# Patient Record
Sex: Female | Born: 1960
Health system: Southern US, Community
[De-identification: ages and names within clinical notes are randomized; demographics above are authoritative.]

## PROBLEM LIST (undated history)

## (undated) DIAGNOSIS — I10 Essential (primary) hypertension: Secondary | ICD-10-CM

## (undated) DIAGNOSIS — K5190 Ulcerative colitis, unspecified, without complications: Secondary | ICD-10-CM

## (undated) DIAGNOSIS — H409 Unspecified glaucoma: Secondary | ICD-10-CM

## (undated) DIAGNOSIS — E119 Type 2 diabetes mellitus without complications: Secondary | ICD-10-CM

## (undated) DIAGNOSIS — M1990 Unspecified osteoarthritis, unspecified site: Secondary | ICD-10-CM

## (undated) DIAGNOSIS — F419 Anxiety disorder, unspecified: Secondary | ICD-10-CM

## (undated) DIAGNOSIS — E785 Hyperlipidemia, unspecified: Secondary | ICD-10-CM

## (undated) DIAGNOSIS — T7840XA Allergy, unspecified, initial encounter: Secondary | ICD-10-CM

## (undated) HISTORY — DX: Allergy, unspecified, initial encounter: T78.40XA

## (undated) HISTORY — PX: CERVICAL BIOPSY: SHX590

## (undated) HISTORY — DX: Unspecified osteoarthritis, unspecified site: M19.90

## (undated) HISTORY — DX: Essential (primary) hypertension: I10

## (undated) HISTORY — PX: ENDOMETRIAL BIOPSY: SHX622

## (undated) HISTORY — DX: Ulcerative colitis, unspecified, without complications: K51.90

## (undated) HISTORY — DX: Hyperlipidemia, unspecified: E78.5

## (undated) HISTORY — DX: Anxiety disorder, unspecified: F41.9

## (undated) HISTORY — DX: Unspecified glaucoma: H40.9

## (undated) HISTORY — PX: DILATION AND CURETTAGE OF UTERUS: SHX78

## (undated) HISTORY — DX: Type 2 diabetes mellitus without complications: E11.9

---

## 2000-01-20 ENCOUNTER — Encounter: Admission: RE | Admit: 2000-01-20 | Discharge: 2000-01-20 | Payer: Self-pay | Admitting: Family Medicine

## 2000-01-20 ENCOUNTER — Encounter: Payer: Self-pay | Admitting: Family Medicine

## 2004-08-22 ENCOUNTER — Encounter: Admission: RE | Admit: 2004-08-22 | Discharge: 2004-08-22 | Payer: Self-pay | Admitting: Family Medicine

## 2005-05-05 DIAGNOSIS — K5190 Ulcerative colitis, unspecified, without complications: Secondary | ICD-10-CM

## 2005-05-05 HISTORY — DX: Ulcerative colitis, unspecified, without complications: K51.90

## 2007-03-11 ENCOUNTER — Encounter: Admission: RE | Admit: 2007-03-11 | Discharge: 2007-03-11 | Payer: Self-pay | Admitting: Family Medicine

## 2008-06-08 ENCOUNTER — Encounter: Admission: RE | Admit: 2008-06-08 | Discharge: 2008-06-08 | Payer: Self-pay | Admitting: Internal Medicine

## 2008-08-16 ENCOUNTER — Emergency Department (HOSPITAL_COMMUNITY): Admission: EM | Admit: 2008-08-16 | Discharge: 2008-08-16 | Payer: Self-pay | Admitting: Family Medicine

## 2010-05-25 ENCOUNTER — Encounter: Payer: Self-pay | Admitting: Family Medicine

## 2010-05-27 ENCOUNTER — Encounter
Admission: RE | Admit: 2010-05-27 | Discharge: 2010-05-27 | Payer: Self-pay | Source: Home / Self Care | Attending: Internal Medicine | Admitting: Internal Medicine

## 2012-08-05 ENCOUNTER — Telehealth: Payer: Self-pay | Admitting: Family Medicine

## 2012-08-05 MED ORDER — ONETOUCH DELICA LANCETS 33G MISC: 1.0000 | Freq: Two times a day (BID) | Status: DC

## 2012-08-05 NOTE — Telephone Encounter (Signed)
Medication refilled per protocol. 

## 2012-10-25 ENCOUNTER — Ambulatory Visit: Payer: Self-pay | Admitting: Physician Assistant

## 2012-10-28 ENCOUNTER — Ambulatory Visit (INDEPENDENT_AMBULATORY_CARE_PROVIDER_SITE_OTHER): Payer: BC Managed Care – PPO | Admitting: Physician Assistant

## 2012-10-28 ENCOUNTER — Encounter: Payer: Self-pay | Admitting: Physician Assistant

## 2012-10-28 VITALS — BP 122/76 | HR 78 | Temp 98.4°F | Resp 18 | Ht 64.5 in | Wt 216.0 lb

## 2012-10-28 DIAGNOSIS — K5010 Crohn's disease of large intestine without complications: Secondary | ICD-10-CM | POA: Insufficient documentation

## 2012-10-28 DIAGNOSIS — K5190 Ulcerative colitis, unspecified, without complications: Secondary | ICD-10-CM | POA: Insufficient documentation

## 2012-10-28 DIAGNOSIS — E119 Type 2 diabetes mellitus without complications: Secondary | ICD-10-CM

## 2012-10-28 DIAGNOSIS — F411 Generalized anxiety disorder: Secondary | ICD-10-CM

## 2012-10-28 DIAGNOSIS — F419 Anxiety disorder, unspecified: Secondary | ICD-10-CM | POA: Insufficient documentation

## 2012-10-28 DIAGNOSIS — E785 Hyperlipidemia, unspecified: Secondary | ICD-10-CM | POA: Insufficient documentation

## 2012-10-28 DIAGNOSIS — I10 Essential (primary) hypertension: Secondary | ICD-10-CM

## 2012-10-28 MED ORDER — ALPRAZOLAM 0.5 MG PO TABS: 0.5000 mg | ORAL_TABLET | Freq: Two times a day (BID) | ORAL | Status: DC | PRN

## 2012-10-28 NOTE — Progress Notes (Signed)
Patient ID: Kimberly Solis MRN: [id number], DOB: [birth year], 52 y.o. Date of Encounter: @DATE @  Chief Complaint:  Chief Complaint  Patient presents with  . 3 mth check up    is fasting    HPI: 52 y.o. year old white female  presents for routine f/u OV. She has no complaints today.   1-Has had a lot of stress. Mom has Parkinsons. Dad is blind. Her hasband has lupus and her daughter was jus tdiagnosed with lupus.   2-DM: She brought in BS log. She checks at different times of day every day. All numbers look good. Fasting readings range from 111-150 but most on lower side.  Has eye exam scheduled for next week.   3-Anxiety; Well controlled with Zoloft. This works well and causes no adv effects. Rarely uses Xanax (bottle expired actually!)  4-HLD: Is taking Crestor. No significant mylagias or other adv effects.    Past Medical History  Diagnosis Date  . Diabetes mellitus without complication   . Hypertension   . Hyperlipidemia   . Anxiety   . Ulcerative colitis 05/05/2005     Home Meds: See attached medication section for current medication list. Any medications entered into computer today will not appear on this note's list. The medications listed below were entered prior to today. Current Outpatient Prescriptions on File Prior to Visit  Medication Sig Dispense Refill  . ONETOUCH DELICA LANCETS 40J MISC 1 each by Does not apply route 2 (two) times daily.  100 each  11   No current facility-administered medications on file prior to visit.    Allergies:  Allergies  Allergen Reactions  . Doxycycline Shortness Of Breath    History   Social History  . Marital Status: Married    Spouse Name: N/A    Number of Children: N/A  . Years of Education: N/A   Occupational History  . Not on file.   Social History Main Topics  . Smoking status: Never Smoker   . Smokeless tobacco: Never Used  . Alcohol Use: No  . Drug Use: No  . Sexually Active: Not on file   Other Topics  Concern  . Not on file   Social History Narrative  . No narrative on file    History reviewed. No pertinent family history.   Review of Systems:  See HPI for pertinent ROS. All other ROS negative.    Physical Exam: Blood pressure 122/76, pulse 78, temperature 98.4 F (36.9 C), temperature source Oral, resp. rate 18, height 5' 4.5" (1.638 m), weight 216 lb (97.977 kg)., Body mass index is 36.52 kg/(m^2). General: Overweight WF.Appears in no acute distress. Neck: Supple. No thyromegaly. No lymphadenopathy. No carotid bruits. Lungs: Clear bilaterally to auscultation without wheezes, rales, or rhonchi. Breathing is unlabored. Heart: RRR with S1 S2. No murmurs, rubs, or gallops. Abdomen: Soft, non-tender, non-distended with normoactive bowel sounds. No hepatomegaly. No rebound/guarding. No obvious abdominal masses. Musculoskeletal:  Strength and tone normal for age. Extremities/Skin: Warm and dry. No clubbing or cyanosis. No edema. No rashes or suspicious lesions. Neuro: Alert and oriented X 3. Moves all extremities spontaneously. Gait is normal. CNII-XII grossly in tact. Psych:  Responds to questions appropriately with a normal affect. Diabetic Foot Exam: See attached Section     ASSESSMENT AND PLAN:  52 y.o. year old female with  1. Anxiety Cont Zoloft. Will refill Xanax so she has available if needed.  - ALPRAZolam (XANAX) 0.5 MG tablet; Take 1 tablet (0.5 mg total) by  mouth 2 (two) times daily as needed for sleep.  Dispense: 30 tablet; Refill: 0  2. Diabetes mellitus without complication Has Eye exam next week.  MicroAlbumin Normal 12/13. - Hemoglobin A1C, fingerstick  3. Hyperlipidemia FLP/LFT good 3/14. At goal. Cont Crestor.   4. BP too low for ACE Inh.   5. Mammogram: Pt had in past year per pt  6. Colonoscopy: 2007-Repeat 10 years per pt.  ROV 3 months   Signed, [place], [place], [facility] 10/28/2012 1:56 PM

## 2012-10-28 NOTE — Progress Notes (Deleted)
  Subjective:    Patient ID: Kimberly Solis, female    DOB: [birth year], 52 y.o.   MRN: [id number]  HPI    Review of Systems     Objective:   Physical Exam        Assessment & Plan:

## 2012-10-29 ENCOUNTER — Encounter: Payer: Self-pay | Admitting: Family Medicine

## 2012-11-16 ENCOUNTER — Other Ambulatory Visit: Payer: Self-pay | Admitting: Family Medicine

## 2012-11-17 ENCOUNTER — Other Ambulatory Visit: Payer: Self-pay | Admitting: Family Medicine

## 2012-11-18 NOTE — Telephone Encounter (Signed)
Med refilled.

## 2012-12-30 ENCOUNTER — Other Ambulatory Visit: Payer: Self-pay | Admitting: Physician Assistant

## 2012-12-30 NOTE — Telephone Encounter (Signed)
Medication refilled per protocol. 

## 2013-02-02 ENCOUNTER — Encounter: Payer: Self-pay | Admitting: Family Medicine

## 2013-02-03 ENCOUNTER — Encounter: Payer: Self-pay | Admitting: Physician Assistant

## 2013-02-03 ENCOUNTER — Ambulatory Visit (INDEPENDENT_AMBULATORY_CARE_PROVIDER_SITE_OTHER): Payer: BC Managed Care – PPO | Admitting: Physician Assistant

## 2013-02-03 ENCOUNTER — Other Ambulatory Visit: Payer: Self-pay | Admitting: Physician Assistant

## 2013-02-03 VITALS — BP 120/80 | HR 78 | Temp 98.4°F | Resp 18 | Wt 219.0 lb

## 2013-02-03 DIAGNOSIS — E119 Type 2 diabetes mellitus without complications: Secondary | ICD-10-CM

## 2013-02-03 DIAGNOSIS — K5190 Ulcerative colitis, unspecified, without complications: Secondary | ICD-10-CM

## 2013-02-03 DIAGNOSIS — I10 Essential (primary) hypertension: Secondary | ICD-10-CM

## 2013-02-03 DIAGNOSIS — F411 Generalized anxiety disorder: Secondary | ICD-10-CM

## 2013-02-03 DIAGNOSIS — F419 Anxiety disorder, unspecified: Secondary | ICD-10-CM

## 2013-02-03 DIAGNOSIS — E785 Hyperlipidemia, unspecified: Secondary | ICD-10-CM

## 2013-02-03 LAB — LIPID PANEL
Cholesterol: 138 mg/dL (ref 0–200)
HDL: 41 mg/dL (ref >39)
Total CHOL/HDL Ratio: 3.4 Ratio

## 2013-02-03 LAB — COMPLETE METABOLIC PANEL WITH GFR
Alkaline Phosphatase: 57 U/L (ref 39–117)
BUN: 13 mg/dL (ref 6–23)
GFR, Est Non African American: 88 mL/min
Glucose, Bld: 129 mg/dL — ABNORMAL HIGH (ref 70–99)
Sodium: 142 mEq/L (ref 135–145)
Total Bilirubin: 0.3 mg/dL (ref 0.3–1.2)

## 2013-02-03 NOTE — Telephone Encounter (Signed)
Diabetic supply refill

## 2013-02-03 NOTE — Progress Notes (Signed)
Patient ID: DONNELL BEAUCHAMP MRN: [id number], DOB: 05/13/1960, 52 y.o. Date of Encounter: @DATE @  Chief Complaint:  Chief Complaint  Patient presents with  . 3 mos f/u  . Flu Vaccine    HPI: 52 y.o. year old white female  presents for routine followup office visit.  1- Stress: At last office visit she reported that she had a lot of stress. At that point one of her stressors included that her dad was blind. Today she reports that he actually passed away in January 17, 2023. She reports that she knows her weight is up today. Says that her father was very ill for quite some time prior to his death. During that time she was just having to do what she could get through each day. 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 The patient was not exercising. Says that she is now back to getting on her exercise and watching her diet.  Also at the last office visit she reported to me that her stress included her husband having lupus. And also her daughter had just been diagnosed with lupus.  2- diabetes: She brought in her blood sugar log today. The numbers are a little bit higher than they were on her walk at her last visit here June 26. See #1 above. She recently had an eye exam about one month ago.  3-anxiety: Well-controlled with Zoloft. This works well with no adverse effects. Says that she has used no Xanax at all since the last visit even despite her father's illness and death Judithe Modest.  4-hyperlipidemia: Taking Crestor. No myalgias or other adverse effects.  5- she has complaints of pain in her right hand around the base of her right thumb and wrist area. Says has not fallen on an outstretched arm/hand.she has had no injury or trauma.   Past Medical History  Diagnosis Date  . Diabetes mellitus without complication   . Hypertension   . Hyperlipidemia   . Anxiety   . Ulcerative colitis 05/05/2005     Home Meds:  See attached medication section for current medication list. Any medications entered into computer today will not appear on this note's list. The medications listed below were entered prior to today. Current Outpatient Prescriptions on File Prior to Visit  Medication Sig Dispense Refill  . ALPRAZolam (XANAX) 0.5 MG tablet Take 1 tablet (0.5 mg total) by mouth 2 (two) times daily as needed for sleep.  30 tablet  0  . aspirin 81 MG tablet Take 81 mg by mouth daily.      . CRESTOR 20 MG tablet TAKE 1 TABLET BY MOUTH DAILY  30 tablet  2  . metFORMIN (GLUCOPHAGE) 1000 MG tablet TAKE 1 TABLET BY MOUTH TWICE A DAY  60 tablet  5  . Multiple Vitamin (MULTIVITAMIN) tablet Take 1 tablet by mouth daily. [facility] +50      . ONETOUCH DELICA LANCETS 85Y MISC 1 each by Does not apply route 2 (two) times daily.  100 each  11  . sertraline (ZOLOFT) 100 MG tablet Take 100 mg by mouth daily.       No current facility-administered medications on file prior to visit.    Allergies:  Allergies  Allergen Reactions  . Doxycycline Shortness Of Breath    History   Social History  . Marital Status: Married    Spouse Name: N/A    Number of Children:  N/A  . Years of Education: N/A   Occupational History  . Not on file.   Social History Main Topics  . Smoking status: Never Smoker   . Smokeless tobacco: Never Used  . Alcohol Use: No  . Drug Use: No  . Sexual Activity: Not on file   Other Topics Concern  . 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   She is married.   Her husband has lupus.   Her daughter was recently diagnosed with lupus.   Her mother has Parkinson's disease. Patient is involved with helping care for her.    No family history on file.   Review of Systems:  See HPI for pertinent ROS. All other ROS negative.    Physical Exam: Blood pressure 120/80, pulse 78, temperature 98.4 F (36.9 C), temperature source Oral,  resp. rate 18, weight 219 lb (99.338 kg)., Body mass index is 37.02 kg/(m^2). General:  overweight white female . Appears in no acute distress. Neck: Supple. No thyromegaly. No lymphadenopathy. no carotid bruits . Lungs: Clear bilaterally to auscultation without wheezes, rales, or rhonchi. Breathing is unlabored. Heart: RRR with S1 S2. No murmurs, rubs, or gallops. Abdomen: Soft, non-tender, non-distended with normoactive bowel sounds. No hepatomegaly. No rebound/guarding. No obvious abdominal masses. Musculoskeletal:  Strength and tone normal for age. Extremities/Skin: Warm and dry. No clubbing or cyanosis. No edema. No rashes or suspicious lesions. Neuro: Alert and oriented X 3. Moves all extremities spontaneously. Gait is normal. CNII-XII grossly in tact. Psych:  Responds to questions appropriately with a normal affect. Diabetic foot exam: Inspection is normal. No deformities no ulcerations no skin breakdown. Sensation is intact to touch and monofilament testing bilaterally. She has trace bilateral dorsalis pedis and posterior tibial pulses      ASSESSMENT AND PLAN:  52 y.o. year old female with  1. Diabetes mellitus without complication SEE HISTORY OF PRESENT ILLNESS. IF HER A1C IS ELEVATED TODAY WILL SIMPLY LET HER IMPROVE HER DIET AND EXERCISE PRIOR TO MAKING MEDICATION ADJUSTMENTS. MICROALBUMIN NORMAL 04/2012. SHE JUST HAD AN EYE EXAM IN THE PAST MONTH. Foot EXAM IS STABLE - COMPLETE METABOLIC PANEL WITH GFR - Hemoglobin A1c  2. Hypertension Blood pressure has been too low to add ACE inhibitor - COMPLETE METABOLIC PANEL WITH GFR  3. Hyperlipidemia WITH THE LAST CHECK MARCH 2014 ON CRESTOR. RECHECK NOW. - COMPLETE METABOLIC PANEL WITH GFR - Lipid panel  4. Anxiety Controlled with Zoloft. Never needs the Xanax.   5. 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 As well she has  been doing a lot of walking and moving items secondary to her father's death. Told her to rest the hand as much as possible for one week and take anti-inflammatories routinely for one week. The pain has not improved then will do evaluation.  5. Ulcerative colitis  6. mammogram: Patient had in the past year per her report   7. colonoscopy: 2007. Repeat 10 years per patient.  8. Immunizations: She is receiving influenza vaccine today. She states that her tetanus vaccine is up to date.  WILL GIVE PNEUMOVAX AT NEXT OV-NOT TODAY SINCE GETTING FLU VACCINE--GIVEN HER DM, NEEDS PNEUMOVAX  REGULAR OFFICE VISIT 3 MONTHS OR SOONER IF NEEds [place].   Signed, [address] [place], [place], [facility] 02/03/2013 9:46 AM

## 2013-02-03 NOTE — Progress Notes (Deleted)
Patient ID: Kimberly Solis MRN: [id number], DOB: 07-20-1960, 52 y.o. Date of Encounter: @DATE @  Chief Complaint:  Chief Complaint  Patient presents with  . 3 mos f/u  . Flu Vaccine    HPI: 52 y.o. year old female  presents with    Past Medical History  Diagnosis Date  . Diabetes mellitus without complication   . Hypertension   . Hyperlipidemia   . Anxiety   . Ulcerative colitis 05/05/2005     Home Meds: See attached medication section for current medication list. Any medications entered into computer today will not appear on this note's list. The medications listed below were entered prior to today. Current Outpatient Prescriptions on File Prior to Visit  Medication Sig Dispense Refill  . ALPRAZolam (XANAX) 0.5 MG tablet Take 1 tablet (0.5 mg total) by mouth 2 (two) times daily as needed for sleep.  30 tablet  0  . aspirin 81 MG tablet Take 81 mg by mouth daily.      . CRESTOR 20 MG tablet TAKE 1 TABLET BY MOUTH DAILY  30 tablet  2  . metFORMIN (GLUCOPHAGE) 1000 MG tablet TAKE 1 TABLET BY MOUTH TWICE A DAY  60 tablet  5  . Multiple Vitamin (MULTIVITAMIN) tablet Take 1 tablet by mouth daily. [facility] +50      . ONETOUCH DELICA LANCETS 40J MISC 1 each by Does not apply route 2 (two) times daily.  100 each  11  . sertraline (ZOLOFT) 100 MG tablet Take 100 mg by mouth daily.       No current facility-administered medications on file prior to visit.    Allergies:  Allergies  Allergen Reactions  . Doxycycline Shortness Of Breath    History   Social History  . Marital Status: Married    Spouse Name: N/A    Number of Children: N/A  . Years of Education: N/A   Occupational History  . Not on file.   Social History Main Topics  . Smoking status: Never Smoker   . Smokeless tobacco: Never Used  . Alcohol Use: No  . Drug Use: No  . Sexual Activity: Not on file   Other Topics Concern  . Not on file   Social History Narrative  . No narrative on file    No  family history on file.   Review of Systems:  See HPI for pertinent ROS. All other ROS negative. ***   Physical Exam:*** Blood pressure 120/80, pulse 78, temperature 98.4 F (36.9 C), temperature source Oral, resp. rate 18, weight 219 lb (99.338 kg)., Body mass index is 37.02 kg/(m^2). General: Appears in no acute distress. Head: Normocephalic, atraumatic, eyes without discharge, sclera non-icteric, nares are without discharge. Bilateral auditory canals clear, TM's are without perforation, pearly grey and translucent with reflective cone of light bilaterally. Oral cavity moist, posterior pharynx without exudate, erythema, peritonsillar abscess, or post nasal drip.  Neck: Supple. No thyromegaly. No lymphadenopathy. Lungs: Clear bilaterally to auscultation without wheezes, rales, or rhonchi. Breathing is unlabored. Heart: RRR with S1 S2. No murmurs, rubs, or gallops. Abdomen: Soft, non-tender, non-distended with normoactive bowel sounds. No hepatomegaly. No rebound/guarding. No obvious abdominal masses. Musculoskeletal:  Strength and tone normal for age. Extremities/Skin: Warm and dry. No clubbing or cyanosis. No edema. No rashes or suspicious lesions. Neuro: Alert and oriented X 3. Moves all extremities spontaneously. Gait is normal. CNII-XII grossly in tact. Psych:  Responds to questions appropriately with a normal affect.     ASSESSMENT  AND PLAN:  52 y.o. year old female with  1. Diabetes mellitus without complication *** - COMPLETE METABOLIC PANEL WITH GFR - Hemoglobin A1c  2. Hypertension *** - COMPLETE METABOLIC PANEL WITH GFR  3. Hyperlipidemia *** - COMPLETE METABOLIC PANEL WITH GFR - Lipid panel  4. Anxiety ***  5. Ulcerative colitis ***   Signed, [address] [place], [place], [facility] 02/03/2013 9:43 AM

## 2013-04-05 ENCOUNTER — Other Ambulatory Visit: Payer: Self-pay | Admitting: Physician Assistant

## 2013-05-12 ENCOUNTER — Ambulatory Visit (INDEPENDENT_AMBULATORY_CARE_PROVIDER_SITE_OTHER): Payer: BC Managed Care – PPO | Admitting: Physician Assistant

## 2013-05-12 ENCOUNTER — Encounter: Payer: Self-pay | Admitting: Physician Assistant

## 2013-05-12 VITALS — BP 114/70 | HR 72 | Temp 98.2°F | Resp 18 | Wt 219.0 lb

## 2013-05-12 DIAGNOSIS — F419 Anxiety disorder, unspecified: Secondary | ICD-10-CM

## 2013-05-12 DIAGNOSIS — E785 Hyperlipidemia, unspecified: Secondary | ICD-10-CM

## 2013-05-12 DIAGNOSIS — Z23 Encounter for immunization: Secondary | ICD-10-CM

## 2013-05-12 DIAGNOSIS — F411 Generalized anxiety disorder: Secondary | ICD-10-CM

## 2013-05-12 DIAGNOSIS — E119 Type 2 diabetes mellitus without complications: Secondary | ICD-10-CM

## 2013-05-12 DIAGNOSIS — I10 Essential (primary) hypertension: Secondary | ICD-10-CM

## 2013-05-12 LAB — HEMOGLOBIN A1C, FINGERSTICK: HEMOGLOBIN A1C, FINGERSTICK: 7.2 % — AB (ref <5.7)

## 2013-05-12 LAB — MICROALBUMIN, URINE: MICROALB UR: 1.88 mg/dL (ref 0.00–1.89)

## 2013-05-12 NOTE — Progress Notes (Signed)
Patient ID: SHERINE CORTESE MRN: [id number], DOB: 03-25-1961, 53 y.o. Date of Encounter: @DATE @  Chief Complaint:  Chief Complaint  Patient presents with  . 3 mth check up    is fasting    HPI: 53 y.o. year old white female  presents for routine followup office visit.  Her last visit with me was 02/03/13. At that time she reported that her father  recently passed away. She reported that he had been very ill for quite some time prior to his death. During that time she was just having to do what she could do to get through each day. Was having to eat whatever she could. Was having to help with transporting her mother back and forth to the hospital et [name]. She was not exercising.  Also at that visit she reported that her husband has lupus and that her daughter had just recently been diagnosed with lupus as well.  Today she reports that they had a good Christmas despite the fact that her father had recently passed.  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  She says that her husband and daughter both see Dr. [name] for their Lupus.  She says that with the holidays she still has not been able to get back to a good routine with diet and exercise. However she feels that this has remained stable compared to her last visit here. She forgot to bring her blood sugar log sheet today but says that she's been getting similar readings as she was at her last visit.  She is taking Crestor and having no myalgias or other adverse effects.   Past Medical History  Diagnosis Date  . Diabetes mellitus without complication   . Hypertension   . Hyperlipidemia   . Anxiety   . Ulcerative colitis 05/05/2005     Home Meds: See attached medication section for current medication list. Any medications entered into computer  today will not appear on this note's list. The medications listed below were entered prior to today. Current Outpatient Prescriptions on File Prior to Visit  Medication Sig Dispense Refill  . ALPRAZolam (XANAX) 0.5 MG tablet Take 1 tablet (0.5 mg total) by mouth 2 (two) times daily as needed for sleep.  30 tablet  0  . aspirin 81 MG tablet Take 81 mg by mouth daily.      . CRESTOR 20 MG tablet TAKE 1 TABLET BY MOUTH EVERY DAY  30 tablet  2  . metFORMIN (GLUCOPHAGE) 1000 MG tablet TAKE 1 TABLET BY MOUTH TWICE A DAY  60 tablet  5  . Multiple Vitamin (MULTIVITAMIN) tablet Take 1 tablet by mouth daily. [facility] +50      . ONE TOUCH ULTRA TEST test strip TEST SUGAR TWICE A DAY  50 each  12  . ONETOUCH DELICA LANCETS 62E MISC 1 each by Does not apply route 2 (two) times daily.  100 each  11  . sertraline (ZOLOFT) 100 MG tablet Take 100 mg by mouth daily.       No current facility-administered medications on file prior to visit.    Allergies:  Allergies  Allergen Reactions  . Doxycycline Shortness Of Breath    History   Social History  . Marital Status: Married    Spouse Name: N/A  Number of Children: N/A  . Years of Education: N/A   Occupational History  . Not on file.   Social History Main Topics  . Smoking status: Never Smoker   . Smokeless tobacco: Never Used  . Alcohol Use: No  . Drug Use: No  . Sexual Activity: Not on file   Other Topics Concern  . 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   She is married.   Her husband has lupus.   Her daughter was recently diagnosed with lupus.   Her mother has Parkinson's disease. Patient is involved with helping care for her.    No family history on file.   Review of Systems:  See HPI for pertinent ROS. All other ROS negative.    Physical Exam: Blood pressure 114/70, pulse 72, temperature 98.2 F (36.8 C), temperature source Oral, resp. rate 18, weight  219 lb (99.338 kg)., Body mass index is 37.02 kg/(m^2). General: Overweight white female .Appears in no acute distress. Neck: Supple. No thyromegaly. No lymphadenopathy. No carotid bruits. Lungs: Clear bilaterally to auscultation without wheezes, rales, or rhonchi. Breathing is unlabored. Heart: RRR with S1 S2. No murmurs, rubs, or gallops. Abdomen: Soft, non-tender, non-distended with normoactive bowel sounds. No hepatomegaly. No rebound/guarding. No obvious abdominal masses. Musculoskeletal:  Strength and tone normal for age. Extremities/Skin: Warm and dry. No clubbing or cyanosis. No edema. No rashes or suspicious lesions. Neuro: Alert and oriented X 3. Moves all extremities spontaneously. Gait is normal. CNII-XII grossly in tact. Psych:  Responds to questions appropriately with a normal affect. Diabetic foot exam: Inspection is normal. No deformities. No ulcerations or skin breakdown. Sensation is intact to touch and monofilament test bi laterally. She has trace bilateral dorsalis pedis and posterior tibial pulses.      ASSESSMENT AND PLAN:  53 y.o. year old female with  1. Diabetes mellitus without complication - Hemoglobin A1c - Microalbumin, urine  Last microalbumin normal 04/2012. Repeat now. She had diabetic eye exam September 2014 Foot exam is normal  She is on statin. She is on no ACE inhibitor  secondary to low blood pressure.   2. Hyperlipidemia She is on Crestor. Lipid panel was excellent at check 02/03/13. LFTs normal. He to recheck at next visit.  3. BP: Blood pressure is on the low side. Therefore can not add ACE inhibitor.  4. Anxiety This is well controlled with Zoloft. She has been given Xanax use as needed but never needs this.  5. Mammogram: She has had in the past year per patient report. Up-to-date.  6. colonoscopy: 2007. Repeat 10 years per patient.  7. Immunizations: Influenza vaccine given here 02/03/13 Tetanus: She reports that this is  up-to-date. Pneumonia vaccine: Given that she has diabetes, she needs this. She is agreeable to receive this. We'll give Pneumovax 23 now.  Regular office visit 3 months or sooner if needed.   [address] [place], [place], [facility] 05/12/2013 8:39 AM

## 2013-05-13 ENCOUNTER — Telehealth: Payer: Self-pay | Admitting: Family Medicine

## 2013-05-13 ENCOUNTER — Other Ambulatory Visit: Payer: Self-pay

## 2013-05-13 DIAGNOSIS — Z1231 Encounter for screening mammogram for malignant neoplasm of breast: Secondary | ICD-10-CM

## 2013-05-13 NOTE — Telephone Encounter (Signed)
Message copied by Olena Mater on Fri May 13, 2013 11:28 AM ------      Message from: Dena Billet      Created: Thu May 12, 2013  1:28 PM       Tell patient that hemoglobin A1c has risen to 7.2.      Tell her that she either has to make changes in her diet, or I need to adjust her medications.      Ask Patient if she thinks that she realistically can make some diet changes. If not then let me know and I will go ahead and adjust medicines.      Tell her to make sure to follow up in 3 months so we can keep closely monitor this. ------

## 2013-05-13 NOTE — Telephone Encounter (Signed)
Pt aware of lab results and provider recommendations.  States she is going to make dietary changes and exercise.  Has appt for 3 months

## 2013-06-09 ENCOUNTER — Ambulatory Visit: Payer: Self-pay

## 2013-06-19 ENCOUNTER — Other Ambulatory Visit: Payer: Self-pay | Admitting: Family Medicine

## 2013-06-30 ENCOUNTER — Ambulatory Visit: Payer: Self-pay

## 2013-07-03 ENCOUNTER — Other Ambulatory Visit: Payer: Self-pay | Admitting: Physician Assistant

## 2013-07-03 DIAGNOSIS — E785 Hyperlipidemia, unspecified: Secondary | ICD-10-CM

## 2013-07-04 ENCOUNTER — Other Ambulatory Visit: Payer: Self-pay | Admitting: Physician Assistant

## 2013-07-04 NOTE — Telephone Encounter (Signed)
Medication refilled per protocol. 

## 2013-07-05 NOTE — Telephone Encounter (Signed)
Refill appropriate and filled per protocol. 

## 2013-07-21 ENCOUNTER — Ambulatory Visit
Admission: RE | Admit: 2013-07-21 | Discharge: 2013-07-21 | Disposition: A | Discharge status: Home / Self Care | Payer: BC Managed Care – PPO | Source: Ambulatory Visit

## 2013-07-21 DIAGNOSIS — Z1231 Encounter for screening mammogram for malignant neoplasm of breast: Secondary | ICD-10-CM

## 2013-07-21 LAB — HM PAP SMEAR: HM Pap smear: NORMAL

## 2013-08-18 ENCOUNTER — Encounter: Payer: Self-pay | Admitting: Physician Assistant

## 2013-08-18 ENCOUNTER — Ambulatory Visit (INDEPENDENT_AMBULATORY_CARE_PROVIDER_SITE_OTHER): Payer: BC Managed Care – PPO | Admitting: Physician Assistant

## 2013-08-18 VITALS — BP 122/80 | HR 84 | Temp 98.3°F | Resp 18 | Ht 66.0 in | Wt 220.0 lb

## 2013-08-18 DIAGNOSIS — F419 Anxiety disorder, unspecified: Secondary | ICD-10-CM

## 2013-08-18 DIAGNOSIS — E785 Hyperlipidemia, unspecified: Secondary | ICD-10-CM

## 2013-08-18 DIAGNOSIS — E119 Type 2 diabetes mellitus without complications: Secondary | ICD-10-CM

## 2013-08-18 DIAGNOSIS — F411 Generalized anxiety disorder: Secondary | ICD-10-CM

## 2013-08-18 DIAGNOSIS — K5190 Ulcerative colitis, unspecified, without complications: Secondary | ICD-10-CM

## 2013-08-18 DIAGNOSIS — I10 Essential (primary) hypertension: Secondary | ICD-10-CM

## 2013-08-18 LAB — COMPLETE METABOLIC PANEL WITH GFR
ALBUMIN: 4.7 g/dL (ref 3.5–5.2)
ALK PHOS: 62 U/L (ref 39–117)
ALT: 20 U/L (ref 0–35)
AST: 18 U/L (ref 0–37)
BILIRUBIN TOTAL: 0.3 mg/dL (ref 0.2–1.2)
BUN: 8 mg/dL (ref 6–23)
CO2: 24 mEq/L (ref 19–32)
CREATININE: 0.71 mg/dL (ref 0.50–1.10)
Calcium: 9.9 mg/dL (ref 8.4–10.5)
Chloride: 103 mEq/L (ref 96–112)
GFR, Est African American: >89 mL/min
GLUCOSE: 146 mg/dL — AB (ref 70–99)
POTASSIUM: 5 meq/L (ref 3.5–5.3)
Sodium: 140 mEq/L (ref 135–145)
Total Protein: 7.1 g/dL (ref 6.0–8.3)

## 2013-08-18 LAB — HEPATIC FUNCTION PANEL
ALBUMIN: 4.7 g/dL (ref 3.5–5.2)
ALK PHOS: 62 U/L (ref 39–117)
ALT: 20 U/L (ref 0–35)
AST: 18 U/L (ref 0–37)
BILIRUBIN INDIRECT: 0.2 mg/dL (ref 0.2–1.2)
Bilirubin, Direct: 0.1 mg/dL (ref 0.0–0.3)
TOTAL PROTEIN: 7.1 g/dL (ref 6.0–8.3)
Total Bilirubin: 0.3 mg/dL (ref 0.2–1.2)

## 2013-08-18 LAB — LIPID PANEL
Cholesterol: 143 mg/dL (ref 0–200)
HDL: 47 mg/dL (ref >39)
LDL CALC: 72 mg/dL (ref 0–99)
TRIGLYCERIDES: 122 mg/dL (ref <150)
Total CHOL/HDL Ratio: 3 Ratio
VLDL: 24 mg/dL (ref 0–40)

## 2013-08-18 LAB — HEMOGLOBIN A1C
HEMOGLOBIN A1C: 7.7 % — AB (ref <5.7)
MEAN PLASMA GLUCOSE: 174 mg/dL — AB (ref <117)

## 2013-08-18 MED ORDER — ALPRAZOLAM 0.5 MG PO TABS: 0.5000 mg | ORAL_TABLET | Freq: Two times a day (BID) | ORAL | Status: DC | PRN

## 2013-08-18 NOTE — Progress Notes (Signed)
Patient ID: Kimberly Solis MRN: [id number], DOB: [birth year], 53 y.o. Date of Encounter: @DATE @  Chief Complaint:  Chief Complaint  Patient presents with  . 3 mth check up    is fasting, refill xanax    HPI: 53 y.o. year old white female  presents for routine followup office visit.  At  Her OV 02/03/13  she reported that her father  recently passed away. She reported that he had been very ill for quite some time prior to his death. During that time she was just having to do what she could do to get through each day. Was having to eat whatever she could. Was having to help with transporting her mother back and forth to the hospital et [name]. She was not exercising.  Also at that visit she reported that her husband has lupus and that her daughter had just recently been diagnosed with lupus as well.  At [place] 05/2013 she reported that they had a good Christmas despite the fact that her father had recently passed.  She says that she is used to working with seniors and being around seniors. 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  She says that her husband and daughter both see Dr. [name] for their Lupus.  She said that with the holidays she still had not been able to get back to a good routine with diet and exercise.  Today she reports that her husband is having bilateral knee replacements by Dr. [name] tomorrow. Says that in the past they have already gone through their 401(k) money to pay for medical expenses. Says that now he recently had to change the mortgage so that they could figure out ways to pay for medical expenses now. Says that all of this has been extremely stressful in addition to fact that like this upcoming week she will need to be out of work to help care for him. As well her mom as well as his mom to keep him busy with them try to help care for them. Is that there  is constant stress. However she says that she thinks feels that she is handling it pretty well. Today she is requesting a prescription for Xanax. Says that she recently has been having to take one every night. Says that otherwise her muscles are extremely tight and she is not sleeping well. As of the Xanax relaxes her muscles and also helps her get to sleep. She never takes it during the day and only takes it at night.  She is taking Crestor and having no myalgias or other adverse effects.   Past Medical History  Diagnosis Date  . Diabetes mellitus without complication   . Hypertension   . Hyperlipidemia   . Anxiety   . Ulcerative colitis 05/05/2005     Home Meds: See attached medication section for current medication list. Any medications entered into computer today will not appear on this note's list. The medications listed below were entered prior to today. Current Outpatient Prescriptions on File Prior to Visit  Medication Sig Dispense Refill  . aspirin 81 MG tablet Take 81 mg by mouth daily.      . CRESTOR 20 MG tablet TAKE 1 TABLET BY MOUTH EVERY DAY  30 tablet  2  . metFORMIN (GLUCOPHAGE) 1000 MG tablet TAKE 1 TABLET BY MOUTH TWICE A DAY  60 tablet  5  .  Multiple Vitamin (MULTIVITAMIN) tablet Take 1 tablet by mouth daily. [facility] +50      . ONE TOUCH ULTRA TEST test strip TEST SUGAR TWICE A DAY  50 each  12  . ONETOUCH DELICA LANCETS 98J MISC 1 each by Does not apply route 2 (two) times daily.  100 each  11  . sertraline (ZOLOFT) 100 MG tablet Take 100 mg by mouth daily.       No current facility-administered medications on file prior to visit.    Allergies:  Allergies  Allergen Reactions  . Doxycycline Shortness Of Breath    History   Social History  . Marital Status: Married    Spouse Name: N/A    Number of Children: N/A  . Years of Education: N/A   Occupational History  . Not on file.   Social History Main Topics  . Smoking status: Never Smoker   .  Smokeless tobacco: Never Used  . Alcohol Use: No  . Drug Use: No  . Sexual Activity: Not on file   Other Topics Concern  . 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   She is married.   Her husband has lupus.   Her daughter was recently diagnosed with lupus.   Her mother has Parkinson's disease. Patient is involved with helping care for her.    No family history on file.   Review of Systems:  See HPI for pertinent ROS. All other ROS negative.    Physical Exam: Blood pressure 122/80, pulse 84, temperature 98.3 F (36.8 C), temperature source Oral, resp. rate 18, height 5' 6"  (1.676 m), weight 220 lb (99.791 kg)., Body mass index is 35.53 kg/(m^2). General: Overweight white female .Appears in no acute distress. Neck: Supple. No thyromegaly. No lymphadenopathy. No carotid bruits. Lungs: Clear bilaterally to auscultation without wheezes, rales, or rhonchi. Breathing is unlabored. Heart: RRR with S1 S2. No murmurs, rubs, or gallops. Abdomen: Soft, non-tender, non-distended with normoactive bowel sounds. No hepatomegaly. No rebound/guarding. No obvious abdominal masses. Musculoskeletal:  Strength and tone normal for age. Extremities/Skin: Warm and dry. No clubbing or cyanosis. No edema. No rashes or suspicious lesions. Neuro: Alert and oriented X 3. Moves all extremities spontaneously. Gait is normal. CNII-XII grossly in tact. Psych:  Responds to questions appropriately with a normal affect. Diabetic foot exam: Inspection is normal. No deformities. No ulcerations or skin breakdown. Sensation is intact to touch and monofilament test bi laterally. She has trace bilateral dorsalis pedis and posterior tibial pulses.      ASSESSMENT AND PLAN:  53 y.o. year old female with  1. Diabetes mellitus without complication - Hemoglobin A1c - Microalbumin, urine--done LOV   She had diabetic eye exam September 2014 Foot exam is  normal  She is on Aspirin 52m QD She is on statin. She is on no ACE inhibitor  secondary to low blood pressure.   2. Hyperlipidemia She is on Crestor. Lipid panel was excellent at check 02/03/13. LFTs normal. Due to rec heck FLP/LFT. Pt is fasting.  3. BP: Blood pressure is on the low side. Therefore can not add ACE inhibitor.  4. Anxiety This is well controlled with Zoloft. She has been given Xanax to use as needed but was never needing this--up through last visit.  Today reports that she does need refill on Xanax b/c she is needing to take it every night. Says it helps her relax her muscles and  helps her get better sleep. Will give Rx Xanax today.  5. Mammogram: She has had in the past year per patient report. Up-to-date.  6. colonoscopy: 2007. Repeat 10 years per patient.  7. Immunizations: Influenza vaccine given here 02/03/13 Tetanus: She reports that this is up-to-date. Pneumonia vaccine: Given that she has diabetes, she needs this. She is agreeable to receive this. We'll give Pneumovax 23 now.  Regular office visit 3 months or sooner if needed.   Marin Olp Nadine, [place], [facility] 08/18/2013 8:42 AM

## 2013-08-19 ENCOUNTER — Telehealth: Payer: Self-pay | Admitting: Family Medicine

## 2013-08-19 DIAGNOSIS — IMO0001 Reserved for inherently not codable concepts without codable children: Secondary | ICD-10-CM

## 2013-08-19 DIAGNOSIS — E1165 Type 2 diabetes mellitus with hyperglycemia: Principal | ICD-10-CM

## 2013-08-19 MED ORDER — PIOGLITAZONE HCL 30 MG PO TABS: 30.0000 mg | ORAL_TABLET | Freq: Every day | ORAL | Status: DC

## 2013-08-19 NOTE — Telephone Encounter (Signed)
Message copied by Olena Mater on Fri Aug 19, 2013 10:12 AM ------      Message from: Dena Billet      Created: Fri Aug 19, 2013  5:07 AM       Patient's A1c is up to 7.7.      Tell her she needs to add Actos 30 mg once a day.      Tell her that all other labs are excellent.      She is already scheduled followup for 3 months.Tell  Her to make sure to keep that appointment and followup in 3 months.      Rx for actos 30 mg one by mouth daily #30 with 2 refills ------

## 2013-08-19 NOTE — Telephone Encounter (Signed)
RX to pharmacy.  Have left message for patient to call me back

## 2013-08-22 NOTE — Telephone Encounter (Signed)
Spoke to patient, aware of lab results.  New RX to pharmacy.  Appt for 3 mths already made

## 2013-09-24 ENCOUNTER — Other Ambulatory Visit: Payer: Self-pay | Admitting: Physician Assistant

## 2013-09-24 DIAGNOSIS — E1165 Type 2 diabetes mellitus with hyperglycemia: Principal | ICD-10-CM

## 2013-09-24 DIAGNOSIS — IMO0001 Reserved for inherently not codable concepts without codable children: Secondary | ICD-10-CM

## 2013-09-27 NOTE — Telephone Encounter (Signed)
Lancets refilled x 1 year

## 2013-10-24 ENCOUNTER — Other Ambulatory Visit: Payer: Self-pay | Admitting: Physician Assistant

## 2013-10-24 DIAGNOSIS — E785 Hyperlipidemia, unspecified: Secondary | ICD-10-CM

## 2013-10-25 NOTE — Telephone Encounter (Signed)
Medication refilled per protocol. 

## 2013-10-26 ENCOUNTER — Other Ambulatory Visit: Payer: Self-pay | Admitting: Physician Assistant

## 2013-10-26 NOTE — Telephone Encounter (Signed)
Medication refilled per protocol. 

## 2013-11-14 ENCOUNTER — Other Ambulatory Visit: Payer: Self-pay | Admitting: Physician Assistant

## 2013-11-15 NOTE — Telephone Encounter (Signed)
?  ok to refill, lov 08/18/13

## 2013-11-16 NOTE — Telephone Encounter (Signed)
Prescription sent to pharmacy.

## 2013-11-16 NOTE — Telephone Encounter (Signed)
Because of her diabetes, she was to have followup office visit 3 months. This is scheduled for 11/17/13. Her anxiety/depression are well-controlled. Ok to send  prescription refill of Zoloft with 5 refills.

## 2013-11-17 ENCOUNTER — Ambulatory Visit (INDEPENDENT_AMBULATORY_CARE_PROVIDER_SITE_OTHER): Payer: BC Managed Care – PPO | Admitting: Physician Assistant

## 2013-11-17 ENCOUNTER — Encounter: Payer: Self-pay | Admitting: Physician Assistant

## 2013-11-17 VITALS — BP 120/78 | HR 78 | Temp 98.3°F | Resp 14 | Ht 65.0 in | Wt 215.0 lb

## 2013-11-17 DIAGNOSIS — I10 Essential (primary) hypertension: Secondary | ICD-10-CM

## 2013-11-17 DIAGNOSIS — F411 Generalized anxiety disorder: Secondary | ICD-10-CM

## 2013-11-17 DIAGNOSIS — E119 Type 2 diabetes mellitus without complications: Secondary | ICD-10-CM

## 2013-11-17 DIAGNOSIS — K5190 Ulcerative colitis, unspecified, without complications: Secondary | ICD-10-CM

## 2013-11-17 DIAGNOSIS — E785 Hyperlipidemia, unspecified: Secondary | ICD-10-CM

## 2013-11-17 DIAGNOSIS — F419 Anxiety disorder, unspecified: Secondary | ICD-10-CM

## 2013-11-17 DIAGNOSIS — K51919 Ulcerative colitis, unspecified with unspecified complications: Secondary | ICD-10-CM

## 2013-11-17 LAB — HEMOGLOBIN A1C, FINGERSTICK: Hgb A1C (fingerstick): 6.9 % — ABNORMAL HIGH (ref <5.7)

## 2013-11-17 MED ORDER — SERTRALINE HCL 100 MG PO TABS: ORAL_TABLET | ORAL | Status: DC

## 2013-11-17 MED ORDER — METFORMIN HCL 1000 MG PO TABS: ORAL_TABLET | ORAL | Status: DC

## 2013-11-17 MED ORDER — ROSUVASTATIN CALCIUM 20 MG PO TABS: ORAL_TABLET | ORAL | Status: DC

## 2013-11-17 NOTE — Progress Notes (Signed)
Patient ID: Kimberly Solis MRN: [id number], DOB: [birth year], 53 y.o. Date of Encounter: @DATE @  Chief Complaint:  Chief Complaint  Patient presents with  . 3 month F/U    is fasting  . Wants to discuss Actos    long term side effects    HPI: 53 y.o. year old white female  presents for routine followup office visit.  At  Her OV 02/03/13  she reported that her father  recently passed away. She reported that he had been very ill for quite some time prior to his death. During that time she was just having to do what she could do to get through each day. Was having to eat whatever she could. Was having to help with transporting her mother back and forth to the hospital [name]. She was not exercising.  Also at that visit she reported that her husband has lupus and that her daughter had just recently been diagnosed with lupus as well.   She says that she is used to working with seniors and being around seniors.   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  She says that her husband and daughter both see Dr. [name] for their Lupus.   At her OV 08/2013 she reported that her husband was having bilateral knee replacements by Dr. [name] the next day.  Today she reports that he did have the bilateral knee replacements. She says that he has done excellent. She says that rather than sending him somewhere for a rehabilitation/physical therapy, Dr. [name] allowed him to go home and for her to care for him. She says that he did excellent with this. She was able to cook healthy foods for him and keep him much more comfortable. Says that he is was just able to return to work this week. Says she is delighted to see that he is able to return to his work which he enjoys and not be in pain.  At prior OV she told me that in the past they have already gone through their 401(k) money to pay for  medical expenses. Says that now he recently had to change the mortgage so that they could figure out ways to pay for medical expenses now. Says that all of this has been extremely stressful in addition to fact that like this upcoming week she will need to be out of work to help care for him. As well her mom as well as his mom to keep him busy with them try to help care for them. Said that there was constant stress. However she says that she thinks feels that she is handling it pretty well. Today she seems to be passed the high stress and much better.   At [place] 08/1013 she reported that she recently had been having to take one Xanax every night. Says that otherwise her muscles are extremely tight and she is not sleeping well. Said the Xanax relaxes her muscles and also helps her get to sleep. She never takes it during the day and only takes it at night.  Today--11/2013--reports that she has not needed her Xanax and every 3 weeks.  Says at this point she would like to just have them available if she does have a day of high stress and feels uptight and having hard time to relax. However is not needing them on a routine basis like  she did in the past.   says she thinks she has lost some weight since the last visit. says they have been eating lots of vegetables and eating very healthy.  Did check. At her visit 08/2013 weight was 220. Today 11/2013 weight 215. Weight down 5 pounds!!  At last office visit did lab work which revealed A1c up to 7.7. At that result note, I said to add Actos 30 mg daily. She says that she did not start the medicine yet because she was on his talk to me about long-term safety first.  Today we did discuss this and she is agreeable to take it if needed. However we will recheck A1c today prior to making that decision. Given her improved diet and 5 pound weight loss her A1c may be improved.  She is taking Crestor -- however she does tell me that quite a while back she decreased the dose  to taking one half of a pill. Says that she was having significant discomfort in her feet. He wasn't sure if the symptoms were from diabetic neuropathy or not. However she can cut the Crestor in half and says that the symptoms in her feet improved 90%. Says that she was on a half tablet for a long time prior to the labs in 08/2013.   Past Medical History  Diagnosis Date  . Diabetes mellitus without complication   . Hypertension   . Hyperlipidemia   . Anxiety   . Ulcerative colitis 05/05/2005     Home Meds:  Outpatient Prescriptions Prior to Visit  Medication Sig Dispense Refill  . ALPRAZolam (XANAX) 0.5 MG tablet Take 1 tablet (0.5 mg total) by mouth 2 (two) times daily as needed for sleep.  60 tablet  2  . aspirin 81 MG tablet Take 81 mg by mouth daily.      . Coenzyme Q10 (COQ10 PO) Take 1 tablet by mouth daily.      . Multiple Vitamin (MULTIVITAMIN) tablet Take 1 tablet by mouth daily. [facility] +50      . ONE TOUCH ULTRA TEST test strip TEST SUGAR TWICE A DAY  50 each  12  . ONETOUCH DELICA LANCETS 61P MISC 1 each by Other route 2 (two) times daily.  100 each  11  . CRESTOR 20 MG tablet TAKE 1 TABLET BY MOUTH EVERY DAY  30 tablet  0  . metFORMIN (GLUCOPHAGE) 1000 MG tablet TAKE 1 TABLET BY MOUTH TWICE A DAY  60 tablet  5  . sertraline (ZOLOFT) 100 MG tablet TAKE 1 TABLET EVERY DAY   30 tablet  5  . pioglitazone (ACTOS) 30 MG tablet Take 1 tablet (30 mg total) by mouth daily.  30 tablet  2   No facility-administered medications prior to visit.     Allergies:  Allergies  Allergen Reactions  . Doxycycline Shortness Of Breath    History   Social History  . Marital Status: Married    Spouse Name: N/A    Number of Children: N/A  . Years of Education: N/A   Occupational History  . Not on file.   Social History Main Topics  . Smoking status: Never Smoker   . Smokeless tobacco: Never Used  . Alcohol Use: No  . Drug Use: No  . Sexual Activity: Not on file   Other Topics  Concern  . 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   She is  married.   Her husband has lupus.   Her daughter was recently diagnosed with lupus.   Her mother has Parkinson's disease. Patient is involved with helping care for her.    No family history on file.   Review of Systems:  See HPI for pertinent ROS. All other ROS negative.    Physical Exam: Blood pressure 120/78, pulse 78, temperature 98.3 F (36.8 C), temperature source Oral, resp. rate 14, height 5' 5"  (1.651 m), weight 215 lb (97.523 kg)., Body mass index is 35.78 kg/(m^2). General: Overweight white female .Appears in no acute distress. Neck: Supple. No thyromegaly. No lymphadenopathy. No carotid bruits. Lungs: Clear bilaterally to auscultation without wheezes, rales, or rhonchi. Breathing is unlabored. Heart: RRR with S1 S2. No murmurs, rubs, or gallops. Abdomen: Soft, non-tender, non-distended with normoactive bowel sounds. No hepatomegaly. No rebound/guarding. No obvious abdominal masses. Musculoskeletal:  Strength and tone normal for age. Extremities/Skin: Warm and dry. No clubbing or cyanosis. No edema. No rashes or suspicious lesions. Neuro: Alert and oriented X 3. Moves all extremities spontaneously. Gait is normal. CNII-XII grossly in tact. Psych:  Responds to questions appropriately with a normal affect. Diabetic foot exam: Inspection is normal. No deformities. No ulcerations or skin breakdown. Sensation is intact to touch and monofilament test bi laterally. She has trace bilateral dorsalis pedis and posterior tibial pulses.      ASSESSMENT AND PLAN:  53 y.o. year old female with  1. Diabetes mellitus without complication - Hemoglobin A1c - Microalbumin, urine--done 08/2013   She had diabetic eye exam September 2014. She has already scheduled another one for September 2015. Foot exam is normal  She is on Aspirin 10m QD She is on  statin. She is on no ACE inhibitor  secondary to low blood pressure. I have discussed again today the long-term benefits of renal protection of being on an ACE inhibitor. Discussed trying a very small dose. She defers. Says that she does not want to take any medications that she does not absolutely have to.  Pneumovax 23 given here 08/2013.  2. Hyperlipidemia She is on Crestor. Lipid panel was excellent at check 02/03/13. LFTs normal. Due to rec heck FLP/LFT. Pt is fasting.  3. BP: Blood pressure is on the low side. Therefore can not add ACE inhibitor. Discussed the renal protection of an ACE inhibitor given her diabetes. However she does not want to take any medications as she does absolutely does not have to take. She defers this prescription even after I discussed prescribing just a very low dose.  4. Anxiety This is well controlled with Zoloft. Currently, she is rarely needing Xanax. Continue to use this as needed.  5. Mammogram: She has had in the past year per patient report. Up-to-date. Sees Gyn routinely.  6. colonoscopy: 2007. Repeat 10 years per patient.  7. Immunizations: Influenza vaccine given here 02/03/13 Tetanus: She reports that this is up-to-date. Pneumonia vaccine:  Pneumovax 23 given here 08/2013.  Regular office visit 3 months or sooner if needed.   [address]DBayard PUtah BBethel Park Surgery Center7/16/2015 8:36 AM

## 2013-11-18 ENCOUNTER — Encounter: Payer: Self-pay | Admitting: *Deleted

## 2013-12-05 ENCOUNTER — Other Ambulatory Visit: Payer: Self-pay | Admitting: Physician Assistant

## 2013-12-06 NOTE — Telephone Encounter (Signed)
Refill appropriate and filled per protocol. 

## 2014-02-02 ENCOUNTER — Telehealth: Payer: Self-pay | Admitting: Family Medicine

## 2014-02-02 DIAGNOSIS — E1165 Type 2 diabetes mellitus with hyperglycemia: Secondary | ICD-10-CM

## 2014-02-02 DIAGNOSIS — IMO0002 Reserved for concepts with insufficient information to code with codable children: Secondary | ICD-10-CM

## 2014-02-02 MED ORDER — GLUCOSE BLOOD VI STRP: 1.0000 | ORAL_STRIP | Freq: Two times a day (BID) | Status: DC

## 2014-02-02 NOTE — Telephone Encounter (Signed)
Test strips refilled

## 2014-02-16 ENCOUNTER — Encounter: Payer: Self-pay | Admitting: Physician Assistant

## 2014-02-16 ENCOUNTER — Ambulatory Visit (INDEPENDENT_AMBULATORY_CARE_PROVIDER_SITE_OTHER): Payer: BC Managed Care – PPO | Admitting: Physician Assistant

## 2014-02-16 VITALS — BP 108/66 | HR 76 | Temp 98.3°F | Resp 18 | Wt 219.0 lb

## 2014-02-16 DIAGNOSIS — K51919 Ulcerative colitis, unspecified with unspecified complications: Secondary | ICD-10-CM

## 2014-02-16 DIAGNOSIS — I10 Essential (primary) hypertension: Secondary | ICD-10-CM

## 2014-02-16 DIAGNOSIS — Z23 Encounter for immunization: Secondary | ICD-10-CM

## 2014-02-16 DIAGNOSIS — E119 Type 2 diabetes mellitus without complications: Secondary | ICD-10-CM

## 2014-02-16 DIAGNOSIS — F419 Anxiety disorder, unspecified: Secondary | ICD-10-CM

## 2014-02-16 DIAGNOSIS — E785 Hyperlipidemia, unspecified: Secondary | ICD-10-CM

## 2014-02-16 LAB — HEMOGLOBIN A1C
Hgb A1c MFr Bld: 7 % — ABNORMAL HIGH (ref <5.7)
Mean Plasma Glucose: 154 mg/dL — ABNORMAL HIGH (ref <117)

## 2014-02-16 LAB — COMPLETE METABOLIC PANEL WITH GFR
ALT: 12 U/L (ref 0–35)
AST: 14 U/L (ref 0–37)
Albumin: 4.3 g/dL (ref 3.5–5.2)
Alkaline Phosphatase: 58 U/L (ref 39–117)
BUN: 11 mg/dL (ref 6–23)
CO2: 27 meq/L (ref 19–32)
CREATININE: 0.75 mg/dL (ref 0.50–1.10)
Calcium: 9.4 mg/dL (ref 8.4–10.5)
Chloride: 106 mEq/L (ref 96–112)
GLUCOSE: 131 mg/dL — AB (ref 70–99)
Potassium: 4.7 mEq/L (ref 3.5–5.3)
SODIUM: 141 meq/L (ref 135–145)
TOTAL PROTEIN: 6.4 g/dL (ref 6.0–8.3)
Total Bilirubin: 0.3 mg/dL (ref 0.2–1.2)

## 2014-02-16 LAB — LIPID PANEL
CHOL/HDL RATIO: 2.9 ratio
Cholesterol: 138 mg/dL (ref 0–200)
HDL: 48 mg/dL (ref >39)
LDL Cholesterol: 73 mg/dL (ref 0–99)
TRIGLYCERIDES: 83 mg/dL (ref <150)
VLDL: 17 mg/dL (ref 0–40)

## 2014-02-16 NOTE — Progress Notes (Signed)
Patient ID: Kimberly Solis MRN: [id number], DOB: 1961-01-22, 53 y.o. Date of Encounter: @DATE @  Chief Complaint:  Chief Complaint  Patient presents with  . 3 mth check up    is fasting    HPI: 53 y.o. year old white female  presents for routine followup office visit.  At  Her OV 02/03/13  she reported that her father  recently passed away. She reported that he had been very ill for quite some time prior to his death. During that time she was just having to do what she could do to get through each day. Was having to eat whatever she could. Was having to help with transporting her mother back and forth to the hospital et [name]. She was not exercising. At 02/2014 OV she reports that she recently well and her mother to a donor tissue program at [facility]. Says it was very nice and they honored the people who have donated tissues and organs over the past year including her father.  Also at the 02/2013 visit she reported that her husband has lupus and that her daughter had just recently been diagnosed with lupus as well.   She says that she is used to working with seniors and being around seniors.   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  She says that her husband and daughter both see Dr. [name] for their Lupus.   At her OV 08/2013 she reported that her husband was having bilateral knee replacements by Dr. [name] the next day.  At f/u OV she reported that he did have the bilateral knee replacements. She says that he has done excellent. She says that rather than sending him somewhere for a rehabilitation/physical therapy, Dr. [name] allowed him to go home and for her to care for him. She says that he did excellent with this. She was able to cook healthy foods for him and keep him much more comfortable. Says that he is was just able to return to work this week. Says she is delighted to  see that he is able to return to his work which he enjoys and not be in pain.  At prior OV she told me that in the past they have already gone through their 401(k) money to pay for medical expenses. Says that now he recently had to change the mortgage so that they could figure out ways to pay for medical expenses now. Says that all of this has been extremely stressful in addition to fact that like this upcoming week she will need to be out of work to help care for him. As well her mom as well as his mom to keep him busy with them try to help care for them. Said that there was constant stress. However she says that she thinks feels that she is handling it pretty well. Today she seems to be passed the high stress and much better.   At [place] 08/1013 she reported that she recently had been having to take one Xanax every night. Says that otherwise her muscles are extremely tight and she is not sleeping well. Said the Xanax relaxes her muscles and also helps her get to sleep. She never takes it during the day and only takes it at night.  At OV--11/2013--reports that she has not needed her Xanax and every 3 weeks.  Says at this point she would  like to just have them available if she does have a day of high stress and feels uptight and having hard time to relax. However is not needing them on a routine basis like she did in the past.   At [place] says she thinks she has lost some weight since the last visit. says they have been eating lots of vegetables and eating very healthy.  Did check. At her visit 08/2013 weight was 220. Today 11/2013 weight 215. Weight down 5 pounds!!  At 08/2013 office visit-- did lab work-- revealed A1c up to 7.7. At that result note, I said to add Actos 30 mg daily. At [place] 11/2013  She said that she did not start the medicine yet because she was on his talk to me about long-term safety first.  At that 11/2013 OV we did discuss this and she was agreeable to take it if needed. However , her weight  was down and we  rechecked A1c that day --told her she did not have to add Actos. She never had to add Actos.   She is taking Crestor -- however she does tell me that quite a while back she decreased the dose to taking one half of a pill. Says that she was having significant discomfort in her feet. She wasn't sure if the symptoms were from diabetic neuropathy or not. However she can cut the Crestor in half and says that the symptoms in her feet improved 90%. Says that she was on a half tablet for a long time prior to the labs in 08/2013.   Past Medical History  Diagnosis Date  . Diabetes mellitus without complication   . Hypertension   . Hyperlipidemia   . Anxiety   . Ulcerative colitis 05/05/2005     Home Meds:  Outpatient Prescriptions Prior to Visit  Medication Sig Dispense Refill  . ALPRAZolam (XANAX) 0.5 MG tablet Take 1 tablet (0.5 mg total) by mouth 2 (two) times daily as needed for sleep.  60 tablet  2  . aspirin 81 MG tablet Take 81 mg by mouth daily.      . Coenzyme Q10 (COQ10 PO) Take 1 tablet by mouth daily.      . Multiple Vitamin (MULTIVITAMIN) tablet Take 1 tablet by mouth daily. [facility] +50      . ONE TOUCH ULTRA TEST test strip TEST SUGAR TWICE A DAY  50 each  12  . ONETOUCH DELICA LANCETS 99B MISC 1 each by Other route 2 (two) times daily.  100 each  11  . CRESTOR 20 MG tablet TAKE 1 TABLET BY MOUTH EVERY DAY  30 tablet  0  . metFORMIN (GLUCOPHAGE) 1000 MG tablet TAKE 1 TABLET BY MOUTH TWICE A DAY  60 tablet  5  . sertraline (ZOLOFT) 100 MG tablet TAKE 1 TABLET EVERY DAY   30 tablet  5  . pioglitazone (ACTOS) 30 MG tablet Take 1 tablet (30 mg total) by mouth daily.  30 tablet  2   No facility-administered medications prior to visit.     Allergies:  Allergies  Allergen Reactions  . Doxycycline Shortness Of Breath    History   Social History  . Marital Status: Married    Spouse Name: N/A    Number of Children: N/A  . Years of Education: N/A    Occupational History  . Not on file.   Social History Main Topics  . Smoking status: Never Smoker   . Smokeless tobacco: Never Used  .  Alcohol Use: No  . Drug Use: No  . Sexual Activity: Not on file   Other Topics Concern  . 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   She is married.   Her husband has lupus.   Her daughter was recently diagnosed with lupus.   Her mother has Parkinson's disease. Patient is involved with helping care for her.    No family history on file.   Review of Systems:  See HPI for pertinent ROS. All other ROS negative.    Physical Exam: Blood pressure 108/66, pulse 76, temperature 98.3 F (36.8 C), temperature source Oral, resp. rate 18, weight 219 lb (99.338 kg)., Body mass index is 36.44 kg/(m^2). General: Overweight white female .Appears in no acute distress. Neck: Supple. No thyromegaly. No lymphadenopathy. No carotid bruits. Lungs: Clear bilaterally to auscultation without wheezes, rales, or rhonchi. Breathing is unlabored. Heart: RRR with S1 S2. No murmurs, rubs, or gallops. Abdomen: Soft, non-tender, non-distended with normoactive bowel sounds. No hepatomegaly. No rebound/guarding. No obvious abdominal masses. Musculoskeletal:  Strength and tone normal for age. Extremities/Skin: Warm and dry. No clubbing or cyanosis. No edema. No rashes or suspicious lesions. Neuro: Alert and oriented X 3. Moves all extremities spontaneously. Gait is normal. CNII-XII grossly in tact. Psych:  Responds to questions appropriately with a normal affect. Diabetic foot exam: Inspection is normal. No deformities. No ulcerations or skin breakdown. Sensation is intact to touch and monofilament test bi laterally. She has trace bilateral dorsalis pedis and posterior tibial pulses.      ASSESSMENT AND PLAN:  53 y.o. year old female with  1. Diabetes mellitus without complication - Hemoglobin  A1c -CMET -FLP - Microalbumin, urine--done 05/12/2013   She had diabetic eye exam September 2015. Foot exam is normal  She is on Aspirin 28m QD She is on statin. She is on no ACE inhibitor  secondary to low blood pressure. I have discussed again today the long-term benefits of renal protection of being on an ACE inhibitor. Discussed trying a very small dose. She defers. Says that she does not want to take any medications that she does not absolutely have to.  Pneumovax 23 given here 08/2013.  2. Hyperlipidemia She is on Crestor See HPI. She really ONLY TAKES 1/2 TABLET. Lipid panel was excellent at check 02/03/13. LFTs normal. Due to recheck FLP/LFT. Pt is fasting.  3. BP: Blood pressure is on the low side. Therefore can not add ACE inhibitor. Discussed the renal protection of an ACE inhibitor given her diabetes. However she does not want to take any medications as she does absolutely does not have to take. She defers this prescription even after I discussed prescribing just a very low dose.  4. Anxiety This is well controlled with Zoloft. Currently, she is rarely needing Xanax. Continue to use this as needed.  5. Mammogram: She has had in the past year per patient report. Up-to-date. Sees Gyn routinely. 02/2014--Reports that she just recently had her annual GYN exam. Reports  GYN added HRT and says that this is controlling her hot flashes and that they are 100% better and she feels so much better now.  6. Colonoscopy: 2007. Repeat 10 years per patient.  7. Immunizations: Influenza vaccine given here 02/16/2014 Tetanus: She reports that this is up-to-date. Pneumonia vaccine:  Pneumovax 23 given here 08/2013.  Regular office visit 3 months or sooner if needed.   Signed, MKaris Juba  PA, BSFM 02/16/2014 8:19 AM

## 2014-04-18 ENCOUNTER — Other Ambulatory Visit: Payer: Self-pay | Admitting: Physician Assistant

## 2014-04-18 NOTE — Telephone Encounter (Signed)
Medication refilled per protocol. 

## 2014-05-25 ENCOUNTER — Encounter: Payer: Self-pay | Admitting: Physician Assistant

## 2014-05-25 ENCOUNTER — Ambulatory Visit (INDEPENDENT_AMBULATORY_CARE_PROVIDER_SITE_OTHER): Payer: BLUE CROSS/BLUE SHIELD | Admitting: Physician Assistant

## 2014-05-25 VITALS — BP 124/80 | HR 68 | Temp 98.8°F | Resp 18 | Wt 216.0 lb

## 2014-05-25 DIAGNOSIS — K5190 Ulcerative colitis, unspecified, without complications: Secondary | ICD-10-CM

## 2014-05-25 DIAGNOSIS — E119 Type 2 diabetes mellitus without complications: Secondary | ICD-10-CM

## 2014-05-25 DIAGNOSIS — F419 Anxiety disorder, unspecified: Secondary | ICD-10-CM

## 2014-05-25 DIAGNOSIS — I10 Essential (primary) hypertension: Secondary | ICD-10-CM

## 2014-05-25 DIAGNOSIS — E785 Hyperlipidemia, unspecified: Secondary | ICD-10-CM

## 2014-05-25 LAB — MICROALBUMIN, URINE: Microalb, Ur: 1 mg/dL (ref <2.0)

## 2014-05-25 LAB — HEMOGLOBIN A1C, FINGERSTICK: HEMOGLOBIN A1C, FINGERSTICK: 7.2 % — AB (ref <5.7)

## 2014-05-25 NOTE — Progress Notes (Signed)
Patient ID: Kimberly Solis MRN: [id number], DOB: 05-25-1960, 54 y.o. Date of Encounter: @DATE @  Chief Complaint:  Chief Complaint  Patient presents with  . 3 mth check up    is fasting    HPI: 54 y.o. year old white female  presents for routine followup office visit.  At  Her OV 02/03/13  she reported that her father  recently passed away. She reported that he had been very ill for quite some time prior to his death. During that time she was just having to do what she could do to get through each day. Was having to eat whatever she could. Was having to help with transporting her mother back and forth to the hospital et [name]. She was not exercising. At 02/2014 OV she reports that she recently well and her mother to a donor tissue program at [facility]. Says it was very nice and they honored the people who have donated tissues and organs over the past year including her father.  Also at the 02/2013 visit she reported that her husband has lupus and that her daughter had just recently been diagnosed with lupus as well.   She says that she is used to working with seniors and being around seniors.   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  She says that her husband and daughter both see Dr. [name] for their Lupus.   At her OV 08/2013 she reported that her husband was having bilateral knee replacements by Dr. [name] the next day.  At f/u OV she reported that he did have the bilateral knee replacements. She says that he has done excellent. She says that rather than sending him somewhere for a rehabilitation/physical therapy, Dr. [name] allowed him to go home and for her to care for him. She says that he did excellent with this. She was able to cook healthy foods for him and keep him much more comfortable. Says that he is was just able to return to work this week. Says she is delighted to  see that he is able to return to his work which he enjoys and not be in pain.  At prior OV she told me that in the past they have already gone through their 401(k) money to pay for medical expenses. Says that now he recently had to change the mortgage so that they could figure out ways to pay for medical expenses now. Says that all of this has been extremely stressful in addition to fact that like this upcoming week she will need to be out of work to help care for him. As well her mom as well as his mom to keep him busy with them try to help care for them. Said that there was constant stress. However she says that she thinks feels that she is handling it pretty well. Today she seems to be passed the high stress and much better.   At [place] 08/1013 she reported that she recently had been having to take one Xanax every night. Says that otherwise her muscles are extremely tight and she is not sleeping well. Said the Xanax relaxes her muscles and also helps her get to sleep. She never takes it during the day and only takes it at night.  At OV--11/2013--reports that she has not needed her Xanax in over 3 weeks.  Says at this point she would  like to just have them available if she does have a day of high stress and feels uptight and having hard time to relax. However is not needing them on a routine basis like she did in the past.   At [place] says she thinks she has lost some weight since the last visit. says they have been eating lots of vegetables and eating very healthy.  Did check. At her visit 08/2013 weight was 220. Today 11/2013 weight 215. Weight down 5 pounds!!  At 08/2013 office visit-- did lab work-- revealed A1c up to 7.7. At that result note, I said to add Actos 30 mg daily. At [place] 11/2013  She said that she did not start the medicine yet because she was on his talk to me about long-term safety first.  At that 11/2013 OV we did discuss this and she was agreeable to take it if needed. However , her weight was  down and we  rechecked A1c that day --told her she did not have to add Actos. She never had to add Actos.   She is taking Crestor -- however she does tell me that quite a while back she decreased the dose to taking one half of a pill. Says that she was having significant discomfort in her feet. She wasn't sure if the symptoms were from diabetic neuropathy or not. However she can cut the Crestor in half and says that the symptoms in her feet improved 90%. Says that she was on a half tablet for a long time prior to the labs in 08/2013.  05/25/14--states still cutting Crestor in half and taking CoQ10.  No symptoms and last FLP was on this dose and excellent.   05/25/14--No complaints. Says she feels good and says everyone else is stable and "things are good."   Past Medical History  Diagnosis Date  . Diabetes mellitus without complication   . Hypertension   . Hyperlipidemia   . Anxiety   . Ulcerative colitis 05/05/2005     Home Meds:  Outpatient Prescriptions Prior to Visit  Medication Sig Dispense Refill  . ALPRAZolam (XANAX) 0.5 MG tablet Take 1 tablet (0.5 mg total) by mouth 2 (two) times daily as needed for sleep.  60 tablet  2  . aspirin 81 MG tablet Take 81 mg by mouth daily.      . Coenzyme Q10 (COQ10 PO) Take 1 tablet by mouth daily.      . Multiple Vitamin (MULTIVITAMIN) tablet Take 1 tablet by mouth daily. [facility] +50      . ONE TOUCH ULTRA TEST test strip TEST SUGAR TWICE A DAY  50 each  12  . ONETOUCH DELICA LANCETS 41R MISC 1 each by Other route 2 (two) times daily.  100 each  11  . CRESTOR 20 MG tablet TAKE 1 TABLET BY MOUTH EVERY DAY  30 tablet  0  . metFORMIN (GLUCOPHAGE) 1000 MG tablet TAKE 1 TABLET BY MOUTH TWICE A DAY  60 tablet  5  . sertraline (ZOLOFT) 100 MG tablet TAKE 1 TABLET EVERY DAY   30 tablet  5  . pioglitazone (ACTOS) 30 MG tablet Take 1 tablet (30 mg total) by mouth daily.  30 tablet  2   No facility-administered medications prior to visit.      Allergies:  Allergies  Allergen Reactions  . Doxycycline Shortness Of Breath    History   Social History  . Marital Status: Married    Spouse Name: N/A  Number of Children: N/A  . Years of Education: N/A   Occupational History  . Not on file.   Social History Main Topics  . Smoking status: Never Smoker   . Smokeless tobacco: Never Used  . Alcohol Use: No  . Drug Use: No  . Sexual Activity: Not on file   Other Topics Concern  . 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   She is married.   Her husband has lupus.   Her daughter was recently diagnosed with lupus.   Her mother has Parkinson's disease. Patient is involved with helping care for her.    History reviewed. No pertinent family history.   Review of Systems:  See HPI for pertinent ROS. All other ROS negative.    Physical Exam: Blood pressure 124/80, pulse 68, temperature 98.8 F (37.1 C), temperature source Oral, resp. rate 18, weight 216 lb (97.977 kg)., Body mass index is 35.94 kg/(m^2). General: Overweight white female .Appears in no acute distress. Neck: Supple. No thyromegaly. No lymphadenopathy. No carotid bruits. Lungs: Clear bilaterally to auscultation without wheezes, rales, or rhonchi. Breathing is unlabored. Heart: RRR with S1 S2. No murmurs, rubs, or gallops. Abdomen: Soft, non-tender, non-distended with normoactive bowel sounds. No hepatomegaly. No rebound/guarding. No obvious abdominal masses. Musculoskeletal:  Strength and tone normal for age. Extremities/Skin: Warm and dry. No clubbing or cyanosis. No edema. No rashes or suspicious lesions. Neuro: Alert and oriented X 3. Moves all extremities spontaneously. Gait is normal. CNII-XII grossly in tact. Psych:  Responds to questions appropriately with a normal affect. Diabetic foot exam: Inspection is normal. No deformities. No ulcerations or skin breakdown. Sensation is  intact to touch and monofilament test bi laterally. She has trace bilateral dorsalis pedis and posterior tibial pulses.      ASSESSMENT AND PLAN:  54 y.o. year old female with  1. Diabetes mellitus without complication - Hemoglobin A1c - Microalbumin, urine--done 05/12/2013--repeat 05/25/14   She had diabetic eye exam September 2015. Foot exam is normal  She is on Aspirin 61m QD She is on statin. FLP excellent 02/2014 LDL 70s She is on no ACE inhibitor  secondary to low blood pressure. I have discussed again today the long-term benefits of renal protection of being on an ACE inhibitor. Discussed trying a very small dose. She defers. Says that she does not want to take any medications that she does not absolutely have to.  Pneumovax 23 given here 08/2013.  2. Hyperlipidemia She is on Crestor See HPI. She really ONLY TAKES 1/2 TABLET. Lipid panel was excellent at check 10/152015. LFTs normal.   3. BP: Blood pressure is on the low side. Therefore can not add ACE inhibitor. Discussed the renal protection of an ACE inhibitor given her diabetes. However she does not want to take any medications as she does absolutely does not have to take. She defers this prescription even after I discussed prescribing just a very low dose.  4. Anxiety This is well controlled with Zoloft. Currently, she is rarely needing Xanax. Continue to use this as needed.  5. Mammogram: She has had in the past year per patient report. Up-to-date. Sees Gyn routinely. 02/2014--Reports that she just recently had her annual GYN exam. Reports  GYN added HRT and says that this is controlling her hot flashes and that they are 100% better and she feels so much better now. 05/2014--Reports they removed IUD  6. Colonoscopy: 2007. Repeat  10 years per patient.  7. Immunizations: Influenza vaccine given here 02/16/2014 Tetanus: She reports that this is up-to-date. Pneumonia vaccine:  Pneumovax 23 given here 08/2013.  No further  pneumonia vaccine until age 3 Discuss Zostavax at 66.   Regular office visit 3 months or sooner if needed.   Marin Olp [place], [place], [facility] 05/25/2014 8:24 AM

## 2014-05-29 ENCOUNTER — Telehealth: Payer: Self-pay | Admitting: Family Medicine

## 2014-05-29 MED ORDER — PIOGLITAZONE HCL 15 MG PO TABS: 15.0000 mg | ORAL_TABLET | Freq: Every day | ORAL | Status: DC

## 2014-05-29 NOTE — Telephone Encounter (Signed)
Has appt for 4/28.  Is willing to try Actos.  Rx to pharmacy.   Sending her Carb counting sheet.

## 2014-05-29 NOTE — Telephone Encounter (Signed)
-----   Message from Orlena Sheldon, PA-C sent at 05/26/2014  9:22 AM EST ----- A1c 7.2. Recommend adding Actos 15 mg daily. If she is reluctant to do this, as she was in the past, then definitely need to be more strict with her carbohydrate intake.

## 2014-06-12 ENCOUNTER — Other Ambulatory Visit: Payer: Self-pay | Admitting: Physician Assistant

## 2014-06-12 NOTE — Telephone Encounter (Signed)
Medication refilled per protocol. 

## 2014-08-31 ENCOUNTER — Ambulatory Visit (INDEPENDENT_AMBULATORY_CARE_PROVIDER_SITE_OTHER): Payer: BLUE CROSS/BLUE SHIELD | Admitting: Physician Assistant

## 2014-08-31 ENCOUNTER — Encounter: Payer: Self-pay | Admitting: Physician Assistant

## 2014-08-31 VITALS — BP 110/78 | HR 78 | Temp 98.1°F | Resp 18 | Ht 66.0 in | Wt 216.0 lb

## 2014-08-31 DIAGNOSIS — I10 Essential (primary) hypertension: Secondary | ICD-10-CM

## 2014-08-31 DIAGNOSIS — K5190 Ulcerative colitis, unspecified, without complications: Secondary | ICD-10-CM | POA: Diagnosis not present

## 2014-08-31 DIAGNOSIS — F419 Anxiety disorder, unspecified: Secondary | ICD-10-CM

## 2014-08-31 DIAGNOSIS — E785 Hyperlipidemia, unspecified: Secondary | ICD-10-CM

## 2014-08-31 DIAGNOSIS — E119 Type 2 diabetes mellitus without complications: Secondary | ICD-10-CM | POA: Diagnosis not present

## 2014-08-31 LAB — COMPLETE METABOLIC PANEL WITH GFR
ALK PHOS: 64 U/L (ref 39–117)
ALT: 21 U/L (ref 0–35)
AST: 21 U/L (ref 0–37)
Albumin: 4.7 g/dL (ref 3.5–5.2)
BILIRUBIN TOTAL: 0.4 mg/dL (ref 0.2–1.2)
BUN: 15 mg/dL (ref 6–23)
CO2: 28 mEq/L (ref 19–32)
Calcium: 9.9 mg/dL (ref 8.4–10.5)
Chloride: 103 mEq/L (ref 96–112)
Creat: 0.72 mg/dL (ref 0.50–1.10)
GFR, Est African American: >89 mL/min
GFR, Est Non African American: >89 mL/min
Glucose, Bld: 145 mg/dL — ABNORMAL HIGH (ref 70–99)
Potassium: 4.9 mEq/L (ref 3.5–5.3)
Sodium: 140 mEq/L (ref 135–145)
Total Protein: 7 g/dL (ref 6.0–8.3)

## 2014-08-31 LAB — LIPID PANEL
Cholesterol: 150 mg/dL (ref 0–200)
HDL: 42 mg/dL — ABNORMAL LOW (ref >=46)
LDL Cholesterol: 74 mg/dL (ref 0–99)
Total CHOL/HDL Ratio: 3.6 Ratio
Triglycerides: 168 mg/dL — ABNORMAL HIGH (ref <150)
VLDL: 34 mg/dL (ref 0–40)

## 2014-08-31 LAB — HEMOGLOBIN A1C
Hgb A1c MFr Bld: 7.3 % — ABNORMAL HIGH (ref <5.7)
Mean Plasma Glucose: 163 mg/dL — ABNORMAL HIGH (ref <117)

## 2014-08-31 NOTE — Progress Notes (Signed)
Patient ID: Kimberly Solis MRN: [id number], DOB: 1961-02-28, 54 y.o. Date of Encounter: @DATE @  Chief Complaint:  Chief Complaint  Patient presents with  . Follow-up    3 mos    HPI: 54 y.o. year old white female  presents for routine followup office visit.  At her OV 02/03/13  she reported that her father  recently passed away. She reported that he had been very ill for quite some time prior to his death. During that time she was just having to do what she could do to get through each day. Was having to eat whatever she could. Was having to help with transporting her mother back and forth to the hospital et [name]. She was not exercising. At 02/2014 OV she reports that she recently well and her mother to a donor tissue program at [facility]. Says it was very nice and they honored the people who have donated tissues and organs over the past year including her father.  Also at the 02/2013 visit she reported that her husband has lupus and that her daughter had just recently been diagnosed with lupus as well.   She says that she is used to working with seniors and being around seniors.   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  She says that her husband and daughter both see Dr. [name] for their Lupus.   At her OV 08/2013 she reported that her husband was having bilateral knee replacements by Dr. [name] the next day.  At f/u OV she reported that he did have the bilateral knee replacements. She says that he has done excellent. She says that rather than sending him somewhere for a rehabilitation/physical therapy, Dr. [name] allowed him to go home and for her to care for him. She says that he did excellent with this. She was able to cook healthy foods for him and keep him much more comfortable. Says that he is was just able to return to work this week. Says she is delighted to see that  he is able to return to his work which he enjoys and not be in pain.  At prior OV she told me that in the past they have already gone through their 401(k) money to pay for medical expenses. Says that now he recently had to change the mortgage so that they could figure out ways to pay for medical expenses now. Says that all of this has been extremely stressful in addition to fact that like this upcoming week she will need to be out of work to help care for him. As well her mom as well as his mom to keep him busy with them try to help care for them. Said that there was constant stress. However she says that she thinks feels that she is handling it pretty well. Today she seems to be passed the high stress and much better.   At [place] 08/1013 she reported that she recently had been having to take one Xanax every night. Says that otherwise her muscles are extremely tight and she is not sleeping well. Said the Xanax relaxes her muscles and also helps her get to sleep. She never takes it during the day and only takes it at night.  At OV--11/2013--reports that she has not needed her Xanax in over 3 weeks.  Says at this point she would like to just have  them available if she does have a day of high stress and feels uptight and having hard time to relax. However is not needing them on a routine basis like she did in the past.   At [place] says she thinks she has lost some weight since the last visit. says they have been eating lots of vegetables and eating very healthy.  Did check. At her visit 08/2013 weight was 220. Today 11/2013 weight 215. Weight down 5 pounds!!  At 08/2013 office visit-- did lab work-- revealed A1c up to 7.7. At that result note, I said to add Actos 30 mg daily. At [place] 11/2013  She said that she did not start the medicine yet because she was on his talk to me about long-term safety first.  At that 11/2013 OV we did discuss this and she was agreeable to take it if needed. However , her weight was down and  we  rechecked A1c that day --told her she did not have to add Actos. She never had to add Actos.   She is taking Crestor -- however she does tell me that quite a while back she decreased the dose to taking one half of a pill. Says that she was having significant discomfort in her feet. She wasn't sure if the symptoms were from diabetic neuropathy or not. However she can cut the Crestor in half and says that the symptoms in her feet improved 90%. Says that she was on a half tablet for a long time prior to the labs in 08/2013.  05/25/14--states still cutting Crestor in half and taking CoQ10.  No symptoms and last FLP was on this dose and excellent.   05/25/14--No complaints. Says she feels good and says everyone else is stable and "things are good."  08/31/2014 OV--She says that she has been feeling good.  (Says that her mom has been having a lot of problems secondary to her Parkinson's). Pt states that she did not add the Actos that I recommended at the last lab 05/25/14. At that time A1c came back 7.2 and recommended to add Actos 15 mg. Today I asked her she was worried about that drug in particular that we have plenty of other options and we can add a different oral medication. She says that she was just concerned that her sugar would get too low. Says that if A1c comes back high again she is agreeable to add this and is fine with taking Actos--Has no concerns about that medication in particular--says was just concerned about BS getting too low.    Past Medical History  Diagnosis Date  . Diabetes mellitus without complication   . Hypertension   . Hyperlipidemia   . Anxiety   . Ulcerative colitis 05/05/2005     Home Meds:  Outpatient Prescriptions Prior to Visit  Medication Sig Dispense Refill  . ALPRAZolam (XANAX) 0.5 MG tablet Take 1 tablet (0.5 mg total) by mouth 2 (two) times daily as needed for sleep.  60 tablet  2  . aspirin 81 MG tablet Take 81 mg by mouth daily.      . Coenzyme Q10  (COQ10 PO) Take 1 tablet by mouth daily.      . Multiple Vitamin (MULTIVITAMIN) tablet Take 1 tablet by mouth daily. [facility] Women[facility] +50      . ONE TOUCH ULTRA TEST test strip TEST SUGAR TWICE A DAY  50 each  12  . ONETOUCH DELICA LANCETS 86V MISC 1 each by Other route 2 (two)  times daily.  100 each  11  . CRESTOR 20 MG tablet TAKE 1 TABLET BY MOUTH EVERY DAY  30 tablet  0  . metFORMIN (GLUCOPHAGE) 1000 MG tablet TAKE 1 TABLET BY MOUTH TWICE A DAY  60 tablet  5  . sertraline (ZOLOFT) 100 MG tablet TAKE 1 TABLET EVERY DAY   30 tablet  5  . pioglitazone (ACTOS) 30 MG tablet Take 1 tablet (30 mg total) by mouth daily.  30 tablet  2   No facility-administered medications prior to visit.     Allergies:  Allergies  Allergen Reactions  . Doxycycline Shortness Of Breath    History   Social History  . Marital Status: Married    Spouse Name: N/A  . Number of Children: N/A  . Years of Education: N/A   Occupational History  . Not on file.   Social History Main Topics  . Smoking status: Never Smoker   . Smokeless tobacco: Never Used  . Alcohol Use: No  . Drug Use: No  . Sexual Activity: Not on file   Other Topics Concern  . 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   She is married.   Her husband has lupus.   Her daughter was recently diagnosed with lupus.   Her mother has Parkinson's disease. Patient is involved with helping care for her.    History reviewed. No pertinent family history.   Review of Systems:  See HPI for pertinent ROS. All other ROS negative.    Physical Exam: Blood pressure 110/78, pulse 78, temperature 98.1 F (36.7 C), temperature source Oral, resp. rate 18, height 5' 6"  (1.676 m), weight 216 lb (97.977 kg)., Body mass index is 34.88 kg/(m^2). General: Overweight white female .Appears in no acute distress. Neck: Supple. No thyromegaly. No lymphadenopathy. No carotid bruits. Lungs: Clear  bilaterally to auscultation without wheezes, rales, or rhonchi. Breathing is unlabored. Heart: RRR with S1 S2. No murmurs, rubs, or gallops. Abdomen: Soft, non-tender, non-distended with normoactive bowel sounds. No hepatomegaly. No rebound/guarding. No obvious abdominal masses. Musculoskeletal:  Strength and tone normal for age. Extremities/Skin: Warm and dry. No clubbing or cyanosis. No edema. No rashes or suspicious lesions. Neuro: Alert and oriented X 3. Moves all extremities spontaneously. Gait is normal. CNII-XII grossly in tact. Psych:  Responds to questions appropriately with a normal affect. Diabetic foot exam: Inspection is normal. No deformities. No ulcerations or skin breakdown. Sensation is intact to touch and monofilament test bi laterally. She has trace bilateral dorsalis pedis and posterior tibial pulses.      ASSESSMENT AND PLAN:  54 y.o. year old female with  1. Diabetes mellitus without complication  AT OV 01/24/1940: Pt states that she did not add the Actos that I recommended at the last lab 05/25/14. At that time A1c came back 7.2 and recommended to add Actos 15 mg. Today I asked her she was worried about that drug in particular that we have plenty of other options and we can add a different oral medication. She says that she was just concerned that her sugar would get too low. Says that if A1c comes back high again she is agreeable to add this and is fine with taking Actos--Has no concerns about that medication in particular--says was just concerned about BS getting too low.   - Hemoglobin A1c  - Microalbumin, urine--done 05/25/14   She had diabetic eye exam September 2015. Foot  exam is normal--documented in Quality Metrics 08/2014  She is on Aspirin 41m QD She is on statin. FLP excellent 02/2014 LDL 70s She is on no ACE inhibitor  secondary to low blood pressure. I have discussed again today the long-term benefits of renal protection of being on an ACE inhibitor.  Discussed trying a very small dose. She defers. Says that she does not want to take any medications that she does not absolutely have to.  Pneumovax 23 given here 08/2013.  2. Hyperlipidemia She is on Crestor See HPI. She really ONLY TAKES 1/2 TABLET. Lipid panel was excellent at check 10/152015. LFTs normal. She says that taking 1/2 of Crestor in combination with CoQ 10 is perfect for her.    3. BP: Blood pressure is on the low side. Therefore can not add ACE inhibitor. Discussed the renal protection of an ACE inhibitor given her diabetes. However she does not want to take any medications as she does absolutely does not have to take. She defers this prescription even after I discussed prescribing just a very low dose.  4. Anxiety This is well controlled with Zoloft. Currently, she is rarely needing Xanax. Continue to use this as needed.  5. Mammogram: She has had in the past year per patient report. Up-to-date. Sees Gyn routinely. 02/2014--Reports that she just recently had her annual GYN exam. Reports  GYN added HRT and says that this is controlling her hot flashes and that they are 100% better and she feels so much better now. 05/2014--Reports they removed IUD  6. Colonoscopy: 2007. Repeat 10 years per patient.  7. Immunizations: Influenza vaccine given here 02/16/2014 Tetanus: She reports that this is up-to-date. Pneumonia vaccine:  Pneumovax 23 given here 08/2013.  No further pneumonia vaccine until age 54 DiscussZostavax at 681   Regular office visit 3 months or sooner if needed.   [address]DAbie PUtah BSunrise Ambulatory Surgical Center4/28/2016 9:34 AM

## 2014-09-05 ENCOUNTER — Other Ambulatory Visit: Payer: Self-pay | Admitting: Physician Assistant

## 2014-09-24 ENCOUNTER — Other Ambulatory Visit: Payer: Self-pay | Admitting: Physician Assistant

## 2014-09-25 NOTE — Telephone Encounter (Signed)
Medication refilled per protocol. 

## 2014-11-13 ENCOUNTER — Other Ambulatory Visit: Payer: Self-pay | Admitting: Physician Assistant

## 2014-11-13 NOTE — Telephone Encounter (Signed)
Lancets refilled

## 2014-11-18 ENCOUNTER — Other Ambulatory Visit: Payer: Self-pay | Admitting: Physician Assistant

## 2014-11-21 NOTE — Telephone Encounter (Signed)
Medication refilled per protocol. 

## 2014-11-22 ENCOUNTER — Other Ambulatory Visit: Payer: Self-pay | Admitting: Physician Assistant

## 2014-11-23 NOTE — Telephone Encounter (Signed)
Medication refilled per protocol. 

## 2014-12-07 ENCOUNTER — Encounter: Payer: Self-pay | Admitting: Physician Assistant

## 2014-12-07 ENCOUNTER — Ambulatory Visit (INDEPENDENT_AMBULATORY_CARE_PROVIDER_SITE_OTHER): Payer: BLUE CROSS/BLUE SHIELD | Admitting: Physician Assistant

## 2014-12-07 VITALS — BP 128/64 | HR 68 | Temp 98.5°F | Resp 14 | Ht 66.0 in | Wt 221.0 lb

## 2014-12-07 DIAGNOSIS — E119 Type 2 diabetes mellitus without complications: Secondary | ICD-10-CM

## 2014-12-07 DIAGNOSIS — E785 Hyperlipidemia, unspecified: Secondary | ICD-10-CM

## 2014-12-07 DIAGNOSIS — I10 Essential (primary) hypertension: Secondary | ICD-10-CM | POA: Diagnosis not present

## 2014-12-07 DIAGNOSIS — K5190 Ulcerative colitis, unspecified, without complications: Secondary | ICD-10-CM

## 2014-12-07 DIAGNOSIS — F419 Anxiety disorder, unspecified: Secondary | ICD-10-CM | POA: Diagnosis not present

## 2014-12-07 LAB — HEMOGLOBIN A1C, FINGERSTICK: HEMOGLOBIN A1C, FINGERSTICK: 6.8 % — AB (ref <5.7)

## 2014-12-07 NOTE — Progress Notes (Addendum)
Patient ID: Kimberly Solis MRN: [id number], DOB: Jan 16, 1961, 54 y.o. Date of Encounter: @DATE @  Chief Complaint:  Chief Complaint  Patient presents with  . 3 month F/U    is fasting    HPI: 54 y.o. year old white female  presents for routine followup office visit.  At her OV 02/03/13  she reported that her father recently passed away. She reported that he had been very ill for quite some time prior to his death. During that time she was just having to do what she could do to get through each day. Was having to eat whatever she could. Was having to help with transporting her mother back and forth to the hospital [name]. She was not exercising. At 02/2014 OV she reports that she recently well and her mother to a donor tissue program at [facility]. Says it was very nice and they honored the people who have donated tissues and organs over the past year including her father.  Also at the 02/2013 visit she reported that her husband has lupus and that her daughter had just recently been diagnosed with lupus as well.   She says that she is used to working with seniors and being around seniors.   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  She says that her husband and daughter both see Dr. [name] for their Lupus.   At her OV 08/2013 she reported that her husband was having bilateral knee replacements by Dr. [name] the next day.  At f/u OV she reported that he did have the bilateral knee replacements. She says that he has done excellent. She says that rather than sending him somewhere for a rehabilitation/physical therapy, Dr. [name] allowed him to go home and for her to care for him. She says that he did excellent with this. She was able to cook healthy foods for him and keep him much more comfortable. Says that he is was just able to return to work this week. Says she is delighted to see  that he is able to return to his work which he enjoys and not be in pain.  At prior OV she told me that in the past they have already gone through their 401(k) money to pay for medical expenses. Says that now he recently had to change the mortgage so that they could figure out ways to pay for medical expenses now. Says that all of this has been extremely stressful in addition to fact that like this upcoming week she will need to be out of work to help care for him. As well her mom as well as his mom to keep him busy with them try to help care for them. Said that there was constant stress. However she says that she thinks feels that she is handling it pretty well. Today she seems to be passed the high stress and much better.   At [place] 53/1015 she reported that she recently had been having to take one Xanax every night. Says that otherwise her muscles are extremely tight and she is not sleeping well. Said the Xanax relaxes her muscles and also helps her get to sleep. She never takes it during the day and only takes it at night.  At OV--11/2013--reports that she has not needed her Xanax in over 3 weeks.  Says at this point she would like to just  have them available if she does have a day of high stress and feels uptight and having hard time to relax. However is not needing them on a routine basis like she did in the past.   At [place] says she thinks she has lost some weight since the last visit. says they have been eating lots of vegetables and eating very healthy.  Did check. At her visit 08/2013 weight was 220. Today 11/2013 weight 215. Weight down 5 pounds!!  At 08/2013 office visit-- did lab work-- revealed A1c up to 7.7. At that result note, I said to add Actos 30 mg daily. At [place] 11/2013  She said that she did not start the medicine yet because she was on his talk to me about long-term safety first.  At that 11/2013 OV we did discuss this and she was agreeable to take it if needed. However , her weight was  down and we  rechecked A1c that day --told her she did not have to add Actos. She never had to add Actos.   08/31/2014 OV--She says that she has been feeling good.  (Says that her mom has been having a lot of problems secondary to her Parkinson's). Pt states that she did not add the Actos that I recommended at the last lab 05/25/14. At that time A1c came back 7.2 and recommended to add Actos 15 mg. Today I asked her she was worried about that drug in particular that we have plenty of other options and we can add a different oral medication. She says that she was just concerned that her sugar would get too low. Says that if A1c comes back high again she is agreeable to add this and is fine with taking Actos--Has no concerns about that medication in particular--says was just concerned about BS getting too low.   08/31/2014--A1C was 7.3---At OV 12/07/2014--She reports that she DID add the Actos. Is taking Actos 84m 1 po QD.   She is taking Crestor -- however she does tell me that quite a while back she decreased the dose to taking one half of a pill. Says that she was having significant discomfort in her feet. She wasn't sure if the symptoms were from diabetic neuropathy or not. However she can cut the Crestor in half and says that the symptoms in her feet improved 90%. Says that she was on a half tablet for a long time prior to the labs in 08/2013.  05/25/14, 08/31/2014, 12/07/2014----states still cutting Crestor in half and taking CoQ10.  No symptoms and last FLP was on this dose and excellent.   12/07/2014:  She says that things have been good. Says that the only thing as the GYN added hormones mid and the and progesterone for hot flashes and this is caused weight gain. Says that she is eating the exact same thing she was before but feels like she is just blowing up. Otherwise has been feeling great. Is very excited. Her, her husband, her daughter and son[place]in[place]  Says that it has been many years since they have gone on any vacation except for just things with family or [facility]  Past Medical History  Diagnosis Date  . Diabetes mellitus without complication   . Hypertension   . Hyperlipidemia   . Anxiety   . Ulcerative colitis 05/05/2005     Home Meds:  Outpatient Prescriptions Prior to Visit  Medication Sig Dispense Refill  .  ALPRAZolam (XANAX) 0.5 MG tablet Take 1 tablet (0.5 mg total) by mouth 2 (two) times daily as needed for sleep.  60 tablet  2  . aspirin 81 MG tablet Take 81 mg by mouth daily.      . Coenzyme Q10 (COQ10 PO) Take 1 tablet by mouth daily.      . Multiple Vitamin (MULTIVITAMIN) tablet Take 1 tablet by mouth daily. [facility] +50      . ONE TOUCH ULTRA TEST test strip TEST SUGAR TWICE A DAY  50 each  12  . ONETOUCH DELICA LANCETS 76H MISC 1 each by Other route 2 (two) times daily.  100 each  11  . CRESTOR 20 MG tablet TAKE 1 TABLET BY MOUTH EVERY DAY  30 tablet  0  . metFORMIN (GLUCOPHAGE) 1000 MG tablet TAKE 1 TABLET BY MOUTH TWICE A DAY  60 tablet  5  . sertraline (ZOLOFT) 100 MG tablet TAKE 1 TABLET EVERY DAY   30 tablet  5  . pioglitazone (ACTOS) 30 MG tablet Take 1 tablet (30 mg total) by mouth daily.  30 tablet  2   No facility-administered medications prior to visit.     Allergies:  Allergies  Allergen Reactions  . Doxycycline Shortness Of Breath    History   Social History  . Marital Status: Married    Spouse Name: N/A  . Number of Children: N/A  . Years of Education: N/A   Occupational History  . Not on file.   Social History Main Topics  . Smoking status: Never Smoker   . Smokeless tobacco: Never Used  . Alcohol Use: No  . Drug Use: No  . Sexual Activity: Not on file   Other Topics Concern  . 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   She is married.   Her husband has lupus.   Her daughter was recently  diagnosed with lupus.   Her mother has Parkinson's disease. Patient is involved with helping care for her.    History reviewed. No pertinent family history.   Review of Systems:  See HPI for pertinent ROS. All other ROS negative.    Physical Exam: Blood pressure 128/64, pulse 68, temperature 98.5 F (36.9 C), temperature source Oral, resp. rate 14, height 5' 6"  (1.676 m), weight 221 lb (100.245 kg)., Body mass index is 35.69 kg/(m^2). General: Overweight white female .Appears in no acute distress. Neck: Supple. No thyromegaly. No lymphadenopathy. No carotid bruits. Lungs: Clear bilaterally to auscultation without wheezes, rales, or rhonchi. Breathing is unlabored. Heart: RRR with S1 S2. No murmurs, rubs, or gallops. Abdomen: Soft, non-tender, non-distended with normoactive bowel sounds. No hepatomegaly. No rebound/guarding. No obvious abdominal masses. Musculoskeletal:  Strength and tone normal for age. Extremities/Skin: Warm and dry. No clubbing or cyanosis. No edema. No rashes or suspicious lesions. Neuro: Alert and oriented X 3. Moves all extremities spontaneously. Gait is normal. CNII-XII grossly in tact. Psych:  Responds to questions appropriately with a normal affect. Diabetic foot exam: Inspection is normal. No deformities. No ulcerations or skin breakdown. Sensation is intact to touch and monofilament test bi laterally. She has trace bilateral dorsalis pedis and posterior tibial pulses.      ASSESSMENT AND PLAN:  54 y.o. year old female with  1. Diabetes mellitus without complication - Hemoglobin A1c  - Microalbumin, urine--done 05/25/14   She had diabetic eye exam September 2015. ADDENDUM----- added 01/24/2015--- received note  from her ophthalmologist from exam 01/17/2015. Exam showed no diabetic retinopathy.--YAY!! Foot exam is normal--documented in Quality Metrics 08/2014  She is on Aspirin 38m QD She is on statin. FLP excellent 02/2014 LDL 70s She is on no ACE  inhibitor  secondary to low blood pressure. I have discussed again today the long-term benefits of renal protection of being on an ACE inhibitor. Discussed trying a very small dose. She defers. Says that she does not want to take any medications that she does not absolutely have to.  Pneumovax 23 given here 08/2013.  2. Hyperlipidemia She is on Crestor See HPI. She really ONLY TAKES 1/2 TABLET. Lipid panel was excellent at check 10/152015. LFTs normal. She says that taking 1/2 of Crestor in combination with CoQ 10 is perfect for her.    3. BP: Blood pressure is on the low side. Therefore can not add ACE inhibitor. Discussed the renal protection of an ACE inhibitor given her diabetes. However she does not want to take any medications as she does absolutely does not have to take. She defers this prescription even after I discussed prescribing just a very low dose.  4. Anxiety This is well controlled with Zoloft. Currently, she is rarely needing Xanax. Continue to use this as needed.  5. Mammogram: She has had in the past year per patient report. Up-to-date. Sees Gyn routinely. 02/2014--Reports that she just recently had her annual GYN exam. Reports  GYN added HRT and says that this is controlling her hot flashes and that they are 100% better and she feels so much better now. 05/2014--Reports they removed IUD  6. Colonoscopy: 2007. Repeat 10 years per patient.  7. Immunizations: Influenza vaccine given here 02/16/2014 Tetanus: She reports that this is up-to-date. Pneumonia vaccine:  Pneumovax 23 given here 08/2013.  No further pneumonia vaccine until age 54 DiscussZostavax at 649   Regular office visit 3 months or sooner if needed.   Signed, M[address] PUtah BDouglas County [facility] Center8/08/2014 8:10 AM

## 2014-12-26 ENCOUNTER — Other Ambulatory Visit: Payer: Self-pay | Admitting: Physician Assistant

## 2014-12-26 NOTE — Telephone Encounter (Signed)
Medication refilled per protocol. 

## 2015-03-15 ENCOUNTER — Ambulatory Visit (INDEPENDENT_AMBULATORY_CARE_PROVIDER_SITE_OTHER): Payer: BLUE CROSS/BLUE SHIELD | Admitting: Physician Assistant

## 2015-03-15 ENCOUNTER — Encounter: Payer: Self-pay | Admitting: Physician Assistant

## 2015-03-15 VITALS — BP 128/74 | HR 72 | Temp 98.7°F | Resp 14 | Ht 66.0 in | Wt 225.0 lb

## 2015-03-15 DIAGNOSIS — E1140 Type 2 diabetes mellitus with diabetic neuropathy, unspecified: Secondary | ICD-10-CM | POA: Insufficient documentation

## 2015-03-15 DIAGNOSIS — E1149 Type 2 diabetes mellitus with other diabetic neurological complication: Secondary | ICD-10-CM

## 2015-03-15 DIAGNOSIS — I10 Essential (primary) hypertension: Secondary | ICD-10-CM | POA: Diagnosis not present

## 2015-03-15 DIAGNOSIS — E785 Hyperlipidemia, unspecified: Secondary | ICD-10-CM

## 2015-03-15 DIAGNOSIS — E118 Type 2 diabetes mellitus with unspecified complications: Secondary | ICD-10-CM

## 2015-03-15 DIAGNOSIS — E1142 Type 2 diabetes mellitus with diabetic polyneuropathy: Secondary | ICD-10-CM | POA: Diagnosis not present

## 2015-03-15 DIAGNOSIS — E119 Type 2 diabetes mellitus without complications: Secondary | ICD-10-CM

## 2015-03-15 DIAGNOSIS — E1169 Type 2 diabetes mellitus with other specified complication: Secondary | ICD-10-CM | POA: Insufficient documentation

## 2015-03-15 DIAGNOSIS — F419 Anxiety disorder, unspecified: Secondary | ICD-10-CM | POA: Diagnosis not present

## 2015-03-15 HISTORY — DX: Type 2 diabetes mellitus with other diabetic neurological complication: E11.49

## 2015-03-15 LAB — COMPLETE METABOLIC PANEL WITH GFR
ALT: 15 U/L (ref 6–29)
AST: 16 U/L (ref 10–35)
Albumin: 4.6 g/dL (ref 3.6–5.1)
Alkaline Phosphatase: 67 U/L (ref 33–130)
BILIRUBIN TOTAL: 0.3 mg/dL (ref 0.2–1.2)
BUN: 12 mg/dL (ref 7–25)
CALCIUM: 10.1 mg/dL (ref 8.6–10.4)
CHLORIDE: 103 mmol/L (ref 98–110)
CO2: 27 mmol/L (ref 20–31)
CREATININE: 0.7 mg/dL (ref 0.50–1.05)
GFR, Est African American: >89 mL/min (ref >=60)
GFR, Est Non African American: >89 mL/min (ref >=60)
Glucose, Bld: 137 mg/dL — ABNORMAL HIGH (ref 70–99)
Potassium: 5.2 mmol/L (ref 3.5–5.3)
Sodium: 140 mmol/L (ref 135–146)
TOTAL PROTEIN: 7.3 g/dL (ref 6.1–8.1)

## 2015-03-15 LAB — LIPID PANEL
CHOLESTEROL: 147 mg/dL (ref 125–200)
HDL: 49 mg/dL (ref >=46)
LDL CALC: 70 mg/dL (ref <130)
Total CHOL/HDL Ratio: 3 Ratio (ref <=5.0)
Triglycerides: 141 mg/dL (ref <150)
VLDL: 28 mg/dL (ref <30)

## 2015-03-15 LAB — HEMOGLOBIN A1C
Hgb A1c MFr Bld: 7 % — ABNORMAL HIGH (ref <5.7)
MEAN PLASMA GLUCOSE: 154 mg/dL — AB (ref <117)

## 2015-03-15 MED ORDER — GABAPENTIN 300 MG PO CAPS: 300.0000 mg | ORAL_CAPSULE | Freq: Three times a day (TID) | ORAL | Status: DC

## 2015-03-15 NOTE — Progress Notes (Signed)
Patient ID: Kimberly Solis MRN: [id number], DOB: 1960/05/31, 54 y.o. Date of Encounter: @DATE @  Chief Complaint:  Chief Complaint  Patient presents with  . 3 month F/U    is fasting  . Medication Management    was given phentermine- would like to discuss before starting meds    HPI: 54 y.o. year old white female  presents for routine followup office visit.  At her OV 02/03/13  she reported that her father recently passed away. She reported that he had been very ill for quite some time prior to his death. During that time she was just having to do what she could do to get through each day. Was having to eat whatever she could. Was having to help with transporting her mother back and forth to the hospital et [name]. She was not exercising. At 02/2014 OV she reports that she recently well and her mother to a donor tissue program at [facility]. Says it was very nice and they honored the people who have donated tissues and organs over the past year including her father.  Also at the 02/2013 visit she reported that her husband has lupus and that her daughter had just recently been diagnosed with lupus as well.   She says that she is used to working with seniors and being around seniors.   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  She says that her husband and daughter both see Dr. [name] for their Lupus.   At her OV 08/2013 she reported that her husband was having bilateral knee replacements by Dr. [name] the next day.  At f/u OV she reported that he did have the bilateral knee replacements. She says that he has done excellent. She says that rather than sending him somewhere for a rehabilitation/physical therapy, Dr. [name] allowed him to go home and for her to care for him. She says that he did excellent with this. She was able to cook healthy foods for him and keep him much more  comfortable. Says that he is was just able to return to work this week. Says she is delighted to see that he is able to return to his work which he enjoys and not be in pain.  At prior OV she told me that in the past they have already gone through their 401(k) money to pay for medical expenses. Says that now he recently had to change the mortgage so that they could figure out ways to pay for medical expenses now. Says that all of this has been extremely stressful in addition to fact that like this upcoming week she will need to be out of work to help care for him. As well her mom as well as his mom to keep him busy with them try to help care for them. Said that there was constant stress. However she says that she thinks feels that she is handling it pretty well. Today she seems to be passed the high stress and much better.   At [place] 08/1013 she reported that she recently had been having to take one Xanax every night. Says that otherwise her muscles are extremely tight and she is not sleeping well. Said the Xanax relaxes her muscles and also helps her get to sleep. She never takes it during the day and only takes it at night.  At OV--11/2013--reports that she has not  needed her Xanax in over 3 weeks.  Says at this point she would like to just have them available if she does have a day of high stress and feels uptight and having hard time to relax. However is not needing them on a routine basis like she did in the past.   At [place] says she thinks she has lost some weight since the last visit. says they have been eating lots of vegetables and eating very healthy.  Did check. At her visit 08/2013 weight was 220. Today 11/2013 weight 215. Weight down 5 pounds!!  At 08/2013 office visit-- did lab work-- revealed A1c up to 7.7. At that result note, I said to add Actos 30 mg daily. At [place] 11/2013  She said that she did not start the medicine yet because she was on his talk to me about long-term safety first.  At that  11/2013 OV we did discuss this and she was agreeable to take it if needed. However , her weight was down and we  rechecked A1c that day --told her she did not have to add Actos. She never had to add Actos.   08/31/2014 OV--She says that she has been feeling good.  (Says that her mom has been having a lot of problems secondary to her Parkinson's). Pt states that she did not add the Actos that I recommended at the last lab 05/25/14. At that time A1c came back 7.2 and recommended to add Actos 15 mg. Today I asked her she was worried about that drug in particular that we have plenty of other options and we can add a different oral medication. She says that she was just concerned that her sugar would get too low. Says that if A1c comes back high again she is agreeable to add this and is fine with taking Actos--Has no concerns about that medication in particular--says was just concerned about BS getting too low.   08/31/2014--A1C was 7.3---At OV 12/07/2014--She reports that she DID add the Actos. Is taking Actos 74m 1 po QD.   She is taking Crestor -- however she does tell me that quite a while back she decreased the dose to taking one half of a pill. Says that she was having significant discomfort in her feet. She wasn't sure if the symptoms were from diabetic neuropathy or not. However she can cut the Crestor in half and says that the symptoms in her feet improved 90%. Says that she was on a half tablet for a long time prior to the labs in 08/2013.  05/25/14, 08/31/2014, 12/07/2014----states still cutting Crestor in half and taking CoQ10.  No symptoms and last FLP was on this dose and excellent.   12/07/2014:  She says that things have been good. Says that the only thing as the GYN added hormones mid and the and progesterone for hot flashes and this is caused weight gain. Says that she is eating the exact same thing she was before but feels like she is just blowing up. Otherwise has been feeling great. Is very  excited. Her, her husband, her daughter and son[place]in-[place]. Says that it has been many years since they have gone on any vacation except for just things with family or [place] [facility]  03/15/15: She is in very good spirits today. Says a new baby coming to the family. Says her [place]  Says her Gyn Rxed Phenteramine  but she wanted to aks me about it before she started it.  Says he is going through menopause. However because she had an IUD in for years she has been having problems with bleeding. Says that for these reasons her gynecologist has increased the dose of her hormones. This is causing her to have weight gain and therefore GYN prescribed phentermine. Patient states that she is taking the Actos that have been added by me in the past. Is taking all other medications as directed/as listed. She reports that her feet hurt at the end of the day and some of her toes burn. Today I discussed medications for this and initially she was deferring medication but then was agreeable to go ahead and add medicine for this.  Past Medical History  Diagnosis Date  . Diabetes mellitus without complication ([place])   . Hypertension   . Hyperlipidemia   . Anxiety   . Ulcerative colitis ([place]) 05/05/2005     Home Meds:  Outpatient Prescriptions Prior to Visit  Medication Sig Dispense Refill  . ALPRAZolam (XANAX) 0.5 MG tablet Take 1 tablet (0.5 mg total) by mouth 2 (two) times daily as needed for sleep.  60 tablet  2  . aspirin 81 MG tablet Take 81 mg by mouth daily.      . Coenzyme Q10 (COQ10 PO) Take 1 tablet by mouth daily.      . Multiple Vitamin (MULTIVITAMIN) tablet Take 1 tablet by mouth daily. [facility] +50      . ONE TOUCH ULTRA TEST test strip TEST SUGAR TWICE A DAY  50 each  12  . ONETOUCH DELICA LANCETS 02I MISC 1 each by Other route 2 (two) times daily.  100 each  11  . CRESTOR 20 MG tablet TAKE 1 TABLET BY  MOUTH EVERY DAY  30 tablet  0  . metFORMIN (GLUCOPHAGE) 1000 MG tablet TAKE 1 TABLET BY MOUTH TWICE A DAY  60 tablet  5  . sertraline (ZOLOFT) 100 MG tablet TAKE 1 TABLET EVERY DAY   30 tablet  5  . pioglitazone (ACTOS) 30 MG tablet Take 1 tablet (30 mg total) by mouth daily.  30 tablet  2   No facility-administered medications prior to visit.     Allergies:  Allergies  Allergen Reactions  . Doxycycline Shortness Of Breath    Social History   Social History  . Marital Status: Married    Spouse Name: N/A  . Number of Children: N/A  . Years of Education: N/A   Occupational History  . Not on file.   Social History Main Topics  . Smoking status: Never Smoker   . Smokeless tobacco: Never Used  . Alcohol Use: No  . Drug Use: No  . Sexual Activity: Not on file   Other Topics Concern  . 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   She is married.   Her husband has lupus.   Her daughter was recently diagnosed with lupus.   Her mother has Parkinson's disease. Patient is involved with helping care for her.    History reviewed. No pertinent family history.   Review of Systems:  See HPI for pertinent ROS. All other ROS negative.    Physical Exam: Blood pressure 128/74, pulse 72, temperature 98.7 F (37.1 C), temperature source Oral, resp. rate 14, height 5' 6"  (1.676 m), weight 225 lb (102.059 kg)., Body mass index is 36.33 kg/(m^2). General:  Overweight white female .Appears in no acute distress. Neck: Supple. No thyromegaly. No lymphadenopathy. No carotid bruits. Lungs: Clear bilaterally to auscultation without wheezes, rales, or rhonchi. Breathing is unlabored. Heart: RRR with S1 S2. No murmurs, rubs, or gallops. Abdomen: Soft, non-tender, non-distended with normoactive bowel sounds. No hepatomegaly. No rebound/guarding. No obvious abdominal masses. Musculoskeletal:  Strength and tone normal for  age. Extremities/Skin: Warm and dry. No clubbing or cyanosis. No edema. No rashes or suspicious lesions. Neuro: Alert and oriented X 3. Moves all extremities spontaneously. Gait is normal. CNII-XII grossly in tact. Psych:  Responds to questions appropriately with a normal affect. Diabetic foot exam: Inspection is normal. No deformities. No ulcerations or skin breakdown. Sensation is intact to touch and monofilament test bi laterally. She has trace bilateral dorsalis pedis and posterior tibial pulses.      ASSESSMENT AND PLAN:  54 y.o. year old female with   1. Diabetes mellitus type 2 with complications - Hemoglobin A1c  - Microalbumin, urine--done 05/25/14   She had diabetic eye exam September 2015. ADDENDUM----- added 01/24/2015--- received note from her ophthalmologist from exam 01/17/2015. Exam showed no diabetic retinopathy.--YAY!! Foot exam is normal--documented in [employer] 08/2014  She is on Aspirin 10m QD She is on statin. FLP excellent 02/2014 LDL 70s She is on no ACE inhibitor  secondary to low blood pressure. I have discussed again today the long-term benefits of renal protection of being on an ACE inhibitor. Discussed trying a very small dose. She defers. Says that she does not want to take any medications that she does not absolutely have to.  Pneumovax 23 given here 08/2013.   2. Diabetic polyneuropathy associated with type 2 diabetes mellitus ([place] Wrote down the following instructions for her:    Day 1: take 1 at night time.    Day 2: take 1 twice daily.    Starting on day 3: take one 3 times daily - gabapentin (NEURONTIN) 300 MG capsule; Take 1 capsule (300 mg total) by mouth 3 (three) times daily.  Dispense: 90 capsule; Refill: 3   3. Hyperlipidemia She is on Crestor See HPI. She really ONLY TAKES 1/2 TABLET. Lipid panel was excellent at check 10/152015. LFTs normal. She says that taking 1/2 of Crestor in combination with CoQ 10 is perfect for her.    4. BP:  Blood pressure is on the low side. Therefore can not add ACE inhibitor. Discussed the renal protection of an ACE inhibitor given her diabetes. However she does not want to take any medications as she does absolutely does not have to take. She defers this prescription even after I discussed prescribing just a very low dose.  5. Anxiety This is well controlled with Zoloft. Currently, she is rarely needing Xanax. Continue to use this as needed.  6. Mammogram: She has had in the past year per patient report. Up-to-date. Sees Gyn routinely. 02/2014--Reports that she just recently had her annual GYN exam. Reports  GYN added HRT and says that this is controlling her hot flashes and that they are 100% better and she feels so much better now. 05/2014--Reports they removed IUD  7. Colonoscopy: 2007. Repeat 10 years per patient.  8. Immunizations: Influenza vaccine given here 02/16/2014 Tetanus: She reports that this is up-to-date. Pneumonia vaccine:  Pneumovax 23 given here 08/2013.  No further pneumonia vaccine until age 54 DiscussZostavax at 645   Regular office visit 3 months or sooner if needed.    Signed, MOlean Ree[place]atogue [place]  BSFM 03/15/2015 8:25 AM

## 2015-03-20 ENCOUNTER — Ambulatory Visit (INDEPENDENT_AMBULATORY_CARE_PROVIDER_SITE_OTHER): Payer: BLUE CROSS/BLUE SHIELD | Admitting: Family Medicine

## 2015-03-20 DIAGNOSIS — Z23 Encounter for immunization: Secondary | ICD-10-CM

## 2015-03-20 MED ORDER — PIOGLITAZONE HCL 45 MG PO TABS: 45.0000 mg | ORAL_TABLET | Freq: Every day | ORAL | Status: DC

## 2015-03-24 ENCOUNTER — Other Ambulatory Visit: Payer: Self-pay | Admitting: Physician Assistant

## 2015-03-26 NOTE — Telephone Encounter (Signed)
Medication refilled per protocol. 

## 2015-03-27 ENCOUNTER — Ambulatory Visit (INDEPENDENT_AMBULATORY_CARE_PROVIDER_SITE_OTHER): Payer: BLUE CROSS/BLUE SHIELD | Admitting: Family Medicine

## 2015-03-27 ENCOUNTER — Encounter: Payer: Self-pay | Admitting: Family Medicine

## 2015-03-27 VITALS — BP 130/68 | HR 98 | Temp 99.2°F | Resp 18 | Wt 224.0 lb

## 2015-03-27 DIAGNOSIS — J069 Acute upper respiratory infection, unspecified: Secondary | ICD-10-CM | POA: Diagnosis not present

## 2015-03-27 MED ORDER — AZITHROMYCIN 250 MG PO TABS: ORAL_TABLET | ORAL | Status: DC

## 2015-03-27 NOTE — Progress Notes (Signed)
Subjective:    Patient ID: Kimberly Solis, female    DOB: 1961-02-18, 54 y.o.   MRN: [id number]  HPI Patient symptoms began last Thursday. She developed diffuse body aches, low-grade fever to 100, sinus pressure, rhinorrhea, postnasal drip, and a cough productive of clear sputum. She continues to complain of a low-grade fever and diffuse body aches.  She has flulike symptoms after receiving the flu shot. He denies any nausea vomiting. She denies any chest pain or shortness of breath or dyspnea on exertion Past Medical History  Diagnosis Date  . Diabetes mellitus without complication ([place])   . Hypertension   . Hyperlipidemia   . Anxiety   . Ulcerative colitis ([place]) 05/05/2005   No past surgical history on file. Current Outpatient Prescriptions on File Prior to Visit  Medication Sig Dispense Refill  . ALPRAZolam (XANAX) 0.5 MG tablet Take 1 tablet (0.5 mg total) by mouth 2 (two) times daily as needed for sleep. 60 tablet 2  . aspirin 81 MG tablet Take 81 mg by mouth daily.    . Coenzyme Q10 (COQ10 PO) Take 1 tablet by mouth daily.    . CRESTOR 20 MG tablet TAKE 1 TABLET BY MOUTH EVERY DAY 30 tablet 3  . estradiol (MINIVELLE) 0.05 MG/24HR patch Place 1 patch onto the skin 2 (two) times a week.    . gabapentin (NEURONTIN) 300 MG capsule Take 1 capsule (300 mg total) by mouth 3 (three) times daily. 90 capsule 3  . glucose blood (ONE TOUCH ULTRA TEST) test strip 1 each by Other route 2 (two) times daily. Use as instructed 100 each 12  . metFORMIN (GLUCOPHAGE) 1000 MG tablet TAKE 1 TABLET BY MOUTH TWICE A DAY 180 tablet 1  . Multiple Vitamin (MULTIVITAMIN) tablet Take 1 tablet by mouth daily. [facility] +50    . ONETOUCH DELICA LANCETS FINE MISC TEST SUGAR 2 TIMES A DAY 100 each 11  . pioglitazone (ACTOS) 45 MG tablet Take 1 tablet (45 mg total) by mouth daily. 90 tablet 0  . progesterone (PROMETRIUM) 100 MG capsule Take 100 mg by mouth at bedtime.  12  . sertraline (ZOLOFT) 100 MG tablet  TAKE 1 TABLET EVERY DAY 90 tablet 1  . temazepam (RESTORIL) 30 MG capsule Take 30 mg by mouth at bedtime as needed.   3   No current facility-administered medications on file prior to visit.   Allergies  Allergen Reactions  . Doxycycline Shortness Of Breath   Social History   Social History  . Marital Status: Married    Spouse Name: N/A  . Number of Children: N/A  . Years of Education: N/A   Occupational History  . Not on file.   Social History Main Topics  . Smoking status: Never Smoker   . Smokeless tobacco: Never Used  . Alcohol Use: No  . Drug Use: No  . Sexual Activity: Not on file   Other Topics Concern  . 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   She is married.   Her husband has lupus.   Her daughter was recently diagnosed with lupus.   Her mother has Parkinson's disease. Patient is involved with helping care for her.      Review of Systems  All other systems reviewed and are negative.      Objective:   Physical Exam  Constitutional: She appears well-developed and well-nourished.  HENT:  Right Ear:  Tympanic membrane, external ear and ear canal normal.  Left Ear: Tympanic membrane, external ear and ear canal normal.  Nose: Mucosal edema and rhinorrhea present. Right sinus exhibits no maxillary sinus tenderness and no frontal sinus tenderness. Left sinus exhibits no maxillary sinus tenderness and no frontal sinus tenderness.  Mouth/Throat: Oropharynx is clear and moist. No oropharyngeal exudate.  Eyes: Conjunctivae are normal. Right eye exhibits no discharge. Left eye exhibits no discharge.  Neck: Neck supple.  Cardiovascular: Normal rate, regular rhythm and normal heart sounds.   No murmur heard. Pulmonary/Chest: Effort normal and breath sounds normal. No respiratory distress. She has no wheezes. She has no rales.  Abdominal: Soft. Bowel sounds are normal.  Lymphadenopathy:    She  has no cervical adenopathy.  Vitals reviewed.         Assessment & Plan:  Acute URI - Plan: azithromycin (ZITHROMAX) 250 MG tablet  Patient's history and physical exam are consistent with a viral upper respiratory infection/viral syndrome. I recommended tincture of time. She can use Sudafed as needed for congestion along with Mucinex for cough. I recommended ibuprofen for fever and body aches. I anticipate gradual spontaneous resolution of symptoms over the next 4-5 days. If symptoms worsen given her immunocompromise status, I did give her a prescription for a Z-Pak with strict instructions not to fill unless certain criteria are met.

## 2015-04-13 ENCOUNTER — Other Ambulatory Visit: Payer: Self-pay | Admitting: Family Medicine

## 2015-04-13 DIAGNOSIS — E1142 Type 2 diabetes mellitus with diabetic polyneuropathy: Secondary | ICD-10-CM

## 2015-04-13 MED ORDER — GABAPENTIN 300 MG PO CAPS: 300.0000 mg | ORAL_CAPSULE | Freq: Three times a day (TID) | ORAL | Status: DC

## 2015-04-13 NOTE — Telephone Encounter (Signed)
90 day refills sent

## 2015-04-20 ENCOUNTER — Other Ambulatory Visit: Payer: Self-pay | Admitting: Physician Assistant

## 2015-04-20 NOTE — Telephone Encounter (Signed)
Pt on 45 mg Actos,  15 mg refill denied.

## 2015-06-21 ENCOUNTER — Encounter: Payer: Self-pay | Admitting: Physician Assistant

## 2015-06-21 ENCOUNTER — Ambulatory Visit (INDEPENDENT_AMBULATORY_CARE_PROVIDER_SITE_OTHER): Payer: BLUE CROSS/BLUE SHIELD | Admitting: Physician Assistant

## 2015-06-21 VITALS — BP 116/70 | HR 64 | Temp 98.4°F | Resp 18 | Wt 229.0 lb

## 2015-06-21 DIAGNOSIS — E1142 Type 2 diabetes mellitus with diabetic polyneuropathy: Secondary | ICD-10-CM | POA: Diagnosis not present

## 2015-06-21 DIAGNOSIS — E118 Type 2 diabetes mellitus with unspecified complications: Secondary | ICD-10-CM

## 2015-06-21 DIAGNOSIS — I10 Essential (primary) hypertension: Secondary | ICD-10-CM

## 2015-06-21 DIAGNOSIS — F419 Anxiety disorder, unspecified: Secondary | ICD-10-CM | POA: Diagnosis not present

## 2015-06-21 DIAGNOSIS — E785 Hyperlipidemia, unspecified: Secondary | ICD-10-CM | POA: Diagnosis not present

## 2015-06-21 LAB — HEMOGLOBIN A1C, FINGERSTICK: HEMOGLOBIN A1C, FINGERSTICK: 6.7 % — AB (ref <5.7)

## 2015-06-21 NOTE — Progress Notes (Signed)
Patient ID: JALAN BODI MRN: [id number], DOB: October 04, 1960, 55 y.o. Date of Encounter: @DATE @  Chief Complaint:  Chief Complaint  Patient presents with  . 3 mth check up    is fasting    HPI: 55 y.o. year old white female  presents for routine followup office visit.  At her OV 02/03/13  she reported that her father recently passed away. She reported that he had been very ill for quite some time prior to his death. During that time she was just having to do what she could do to get through each day. Was having to eat whatever she could. Was having to help with transporting her mother back and forth to the hospital et [name]. She was not exercising. At 02/2014 OV she reports that she recently well and her mother to a donor tissue program at [facility]. Says it was very nice and they honored the people who have donated tissues and organs over the past year including her father.  Also at the 02/2013 visit she reported that her husband has lupus and that her daughter had just recently been diagnosed with lupus as well.   She says that she is used to working with seniors and being around seniors.   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  She says that her husband and daughter both see Dr. [name] for their Lupus.   At her OV 08/2013 she reported that her husband was having bilateral knee replacements by Dr. [name] the next day.  At f/u OV she reported that he did have the bilateral knee replacements. She says that he has done excellent. She says that rather than sending him somewhere for a rehabilitation/physical therapy, Dr. [name] allowed him to go home and for her to care for him. She says that he did excellent with this. She was able to cook healthy foods for him and keep him much more comfortable. Says that he is was just able to return to work this week. Says she is delighted to  see that he is able to return to his work which he enjoys and not be in pain.  At prior OV she told me that in the past they have already gone through their 401(k) money to pay for medical expenses. Says that now he recently had to change the mortgage so that they could figure out ways to pay for medical expenses now. Says that all of this has been extremely stressful in addition to fact that like this upcoming week she will need to be out of work to help care for him. As well her mom as well as his mom to keep him busy with them try to help care for them. Said that there was constant stress. However she says that she thinks feels that she is handling it pretty well. Today she seems to be passed the high stress and much better.   At [place] 08/1013 she reported that she recently had been having to take one Xanax every night. Says that otherwise her muscles are extremely tight and she is not sleeping well. Said the Xanax relaxes her muscles and also helps her get to sleep. She never takes it during the day and only takes it at night.  At OV--11/2013--reports that she has not needed her Xanax in over 3 weeks.  Says at this point she would like to  just have them available if she does have a day of high stress and feels uptight and having hard time to relax. However is not needing them on a routine basis like she did in the past.   At [place] says she thinks she has lost some weight since the last visit. says they have been eating lots of vegetables and eating very healthy.  Did check. At her visit 08/2013 weight was 220. Today 11/2013 weight 215. Weight down 5 pounds!!  At 08/2013 office visit-- did lab work-- revealed A1c up to 7.7. At that result note, I said to add Actos 30 mg daily. At [place] 11/2013  She said that she did not start the medicine yet because she was on his talk to me about long-term safety first.  At that 11/2013 OV we did discuss this and she was agreeable to take it if needed. However , her weight was  down and we  rechecked A1c that day --told her she did not have to add Actos. She never had to add Actos.   08/31/2014 OV--She says that she has been feeling good.  (Says that her mom has been having a lot of problems secondary to her Parkinson's). Pt states that she did not add the Actos that I recommended at the last lab 05/25/14. At that time A1c came back 7.2 and recommended to add Actos 15 mg. Today I asked her she was worried about that drug in particular that we have plenty of other options and we can add a different oral medication. She says that she was just concerned that her sugar would get too low. Says that if A1c comes back high again she is agreeable to add this and is fine with taking Actos--Has no concerns about that medication in particular--says was just concerned about BS getting too low.   08/31/2014--A1C was 7.3---At OV 12/07/2014--She reports that she DID add the Actos. Is taking Actos 60m 1 po QD.   She is taking Crestor -- however she does tell me that quite a while back she decreased the dose to taking one half of a pill. Says that she was having significant discomfort in her feet. She wasn't sure if the symptoms were from diabetic neuropathy or not. However she can cut the Crestor in half and says that the symptoms in her feet improved 90%. Says that she was on a half tablet for a long time prior to the labs in 08/2013.  05/25/14, 08/31/2014, 12/07/2014----states still cutting Crestor in half and taking CoQ10.  No symptoms and last FLP was on this dose and excellent.   12/07/2014:  She says that things have been good. Says that the only thing as the GYN added hormones mid and the and progesterone for hot flashes and this is caused weight gain. Says that she is eating the exact same thing she was before but feels like she is just blowing up. Otherwise has been feeling great. Is very excited. Her, her husband, her daughter [place] son[place]in[place]  Says that it has been many years since they have gone on any vacation except for just things with family or [facility]  03/15/15: She is in very good spirits today. Says a new baby coming to the family. Says her [place]  Says her Gyn Rxed Phenteramine but she wanted to aks me about it before she started it.  Says he is  going through menopause. However because she had an IUD in for years she has been having problems with bleeding. Says that for these reasons her gynecologist has increased the dose of her hormones. This is causing her to have weight gain and therefore GYN prescribed phentermine. Patient states that she is taking the Actos that have been added by me in the past. Is taking all other medications as directed/as listed. She reports that her feet hurt at the end of the day and some of her toes burn. Today I discussed medications for this and initially she was deferring medication but then was agreeable to go ahead and add medicine for this. At that visit--prescribed Neurontin.  06/21/2015: Patient states that she did take the Neurontin but she isn't even having to take it anymore. Says that her feet are not even hurting anymore. Says that her gynecologist still has her on hormones and "everything is better except weight gain". She says that she really doesn't think that she is eating any more than she used to in the past and really does not think that it is causing food cravings but feels like it "is just making her swell up and blow up and gain weight". Did not use any phentermine that the gynecologist prescribed. Reviewed the labs performed here at last visit 03/15/2015 A1c was 7.0 and I recommended her increase Actos to 45 mg. She says she did make that change and is taking the Actos 45 mg. No complaints or concerns today other than the weight gain. Says that they are going to have her mother move in with them. However says that it will  be a transitional process as she has to get her pottery materials from her mom's house and make a space for those --then work on making her mother.  Past Medical History  Diagnosis Date  . Diabetes mellitus without complication ([place])   . Hypertension   . Hyperlipidemia   . Anxiety   . Ulcerative colitis ([place]) 05/05/2005     Home Meds:  Outpatient Prescriptions Prior to Visit  Medication Sig Dispense Refill  . ALPRAZolam (XANAX) 0.5 MG tablet Take 1 tablet (0.5 mg total) by mouth 2 (two) times daily as needed for sleep.  60 tablet  2  . aspirin 81 MG tablet Take 81 mg by mouth daily.      . Coenzyme Q10 (COQ10 PO) Take 1 tablet by mouth daily.      . Multiple Vitamin (MULTIVITAMIN) tablet Take 1 tablet by mouth daily. [facility] +50      . ONE TOUCH ULTRA TEST test strip TEST SUGAR TWICE A DAY  50 each  12  . ONETOUCH DELICA LANCETS 71I MISC 1 each by Other route 2 (two) times daily.  100 each  11  . CRESTOR 20 MG tablet TAKE 1 TABLET BY MOUTH EVERY DAY  30 tablet  0  . metFORMIN (GLUCOPHAGE) 1000 MG tablet TAKE 1 TABLET BY MOUTH TWICE A DAY  60 tablet  5  . sertraline (ZOLOFT) 100 MG tablet TAKE 1 TABLET EVERY DAY   30 tablet  5  . pioglitazone (ACTOS) 30 MG tablet Take 1 tablet (30 mg total) by mouth daily.  30 tablet  2   No facility-administered medications prior to visit.     Allergies:  Allergies  Allergen Reactions  . Doxycycline Shortness Of Breath    Social History   Social History  . Marital Status: Married    Spouse Name: N/A  .  Number of Children: N/A  . Years of Education: N/A   Occupational History  . Not on file.   Social History Main Topics  . Smoking status: Never Smoker   . Smokeless tobacco: Never Used  . Alcohol Use: No  . Drug Use: No  . Sexual Activity: Not on file   Other Topics Concern  . 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   She is married.   Her husband has lupus.    Her daughter was recently diagnosed with lupus.   Her mother has Parkinson's disease. Patient is involved with helping care for her.    History reviewed. No pertinent family history.   Review of Systems:  See HPI for pertinent ROS. All other ROS negative.    Physical Exam: Blood pressure 116/70, pulse 64, temperature 98.4 F (36.9 C), temperature source Oral, resp. rate 18, weight 229 lb (103.874 kg)., Body mass index is 36.98 kg/(m^2). General: Overweight white female .Appears in no acute distress. Neck: Supple. No thyromegaly. No lymphadenopathy. No carotid bruits. Lungs: Clear bilaterally to auscultation without wheezes, rales, or rhonchi. Breathing is unlabored. Heart: RRR with S1 S2. No murmurs, rubs, or gallops. Abdomen: Soft, non-tender, non-distended with normoactive bowel sounds. No hepatomegaly. No rebound/guarding. No obvious abdominal masses. Musculoskeletal:  Strength and tone normal for age. Extremities/Skin: Warm and dry. No clubbing or cyanosis. No edema. No rashes or suspicious lesions. Neuro: Alert and oriented X 3. Moves all extremities spontaneously. Gait is normal. CNII-XII grossly in tact. Psych:  Responds to questions appropriately with a normal affect. Diabetic foot exam: Inspection is normal. No deformities. No ulcerations or skin breakdown. Sensation is intact to touch and monofilament test bi laterally. She has trace bilateral dorsalis pedis and posterior tibial pulses.      ASSESSMENT AND PLAN:  55 y.o. year old female with   1. Diabetes mellitus type 2 with complications - Hemoglobin A1c  - Microalbumin, urine--done 05/25/14---NEED TO REPEAT AT NEXT OV---------------   She had diabetic eye exam September 2015. ADDENDUM----- added 01/24/2015--- received note from her ophthalmologist from exam 01/17/2015. Exam showed no diabetic retinopathy.--YAY!! Foot exam is normal--documented in [employer] 08/2014  She is on Aspirin 25m QD She is on statin.  FLP excellent 02/2014 LDL 70s She is on no ACE inhibitor  secondary to low blood pressure. I have discussed again today the long-term benefits of renal protection of being on an ACE inhibitor. Discussed trying a very small dose. She defers. Says that she does not want to take any medications that she does not absolutely have to.  Pneumovax 23 given here 08/2013.   2. Diabetic polyneuropathy associated with type 2 diabetes mellitus ([facility]---Presrcibed: Wrote down the following instructions for her:    Day 1: take 1 at night time.    Day 2: take 1 twice daily.    Starting on day 3: take one 3 times daily - gabapentin (NEURONTIN) 300 MG capsule; Take 1 capsule (300 mg total) by mouth 3 (three) times daily.  Dispense: 90 capsule; Refill: 3 At OV 06/21/15-- says that she is no longer needing this medicine and is no longer having pain in her feet even off of medication.  3. Hyperlipidemia She is on Crestor See HPI. She really ONLY TAKES 1/2 TABLET. Lipid panel was excellent at check 10/152015. LFTs normal. She says that taking 1/2 of Crestor in combination with CoQ 10 is  perfect for her.    4. BP: Blood pressure is on the low side. Therefore can not add ACE inhibitor. Discussed the renal protection of an ACE inhibitor given her diabetes. However she does not want to take any medications as she does absolutely does not have to take. She defers this prescription even after I discussed prescribing just a very low dose.  5. Anxiety This is well controlled with Zoloft. Currently, she is rarely needing Xanax. Continue to use this as needed.  6. Mammogram: She has had in the past year per patient report. Up-to-date. Sees Gyn routinely. 02/2014--Reports that she just recently had her annual GYN exam. Reports  GYN added HRT and says that this is controlling her hot flashes and that they are 100% better and she feels so much better now. 05/2014--Reports they removed IUD  7. Colonoscopy: 2007.  Repeat 10 years per patient. ---------- at [place] 06/21/15 discussed with her that colonoscopy is due this year. She says that she has already received a reminder / notification from Dr. Collene Mares and patient states that she will call and schedule follow-up there.  8. Immunizations: Influenza vaccine given here 02/16/2014 Tetanus: She reports that this is up-to-date. Pneumonia vaccine:  Pneumovax 23 given here 08/2013.  No further pneumonia vaccine until age 65 Discuss Zostavax at 28.  Weight Gain At OV 06/21/15 I reviewed her prior weights. 08/31/14------------- 216 12/07/14----------------221 03/15/15------------ 225 06/21/15------------ 229 Recommend that she go ahead and take the phentermine daily. Recommend that she check her weight on her home scales today and document that as her baseline weight and then wait 2 weeks to recheck her weight. If weight stable or lower at that time, then continue the phentermine. If weight higher in 2 weeks-- then she needs to call gynecologist and inform them and see about changing the hormones.   Regular office visit 3 months or sooner if needed.    [address] [place], [place], [facility] 06/21/2015 1:34 PM

## 2015-07-14 ENCOUNTER — Other Ambulatory Visit: Payer: Self-pay | Admitting: Physician Assistant

## 2015-07-16 NOTE — Telephone Encounter (Signed)
Medication refilled per protocol. 

## 2015-07-23 ENCOUNTER — Other Ambulatory Visit: Payer: Self-pay

## 2015-07-23 DIAGNOSIS — Z1231 Encounter for screening mammogram for malignant neoplasm of breast: Secondary | ICD-10-CM

## 2015-08-20 ENCOUNTER — Ambulatory Visit
Admission: RE | Admit: 2015-08-20 | Discharge: 2015-08-20 | Disposition: A | Discharge status: Home / Self Care | Payer: BLUE CROSS/BLUE SHIELD | Source: Ambulatory Visit

## 2015-08-20 DIAGNOSIS — Z1231 Encounter for screening mammogram for malignant neoplasm of breast: Secondary | ICD-10-CM

## 2015-09-22 ENCOUNTER — Other Ambulatory Visit: Payer: Self-pay | Admitting: Physician Assistant

## 2015-09-24 ENCOUNTER — Ambulatory Visit: Payer: BLUE CROSS/BLUE SHIELD | Admitting: Physician Assistant

## 2015-09-24 NOTE — Telephone Encounter (Signed)
Refill appropriate and filled per protocol. 

## 2015-09-27 ENCOUNTER — Ambulatory Visit: Payer: BLUE CROSS/BLUE SHIELD | Admitting: Physician Assistant

## 2015-10-25 ENCOUNTER — Ambulatory Visit (INDEPENDENT_AMBULATORY_CARE_PROVIDER_SITE_OTHER): Payer: BLUE CROSS/BLUE SHIELD | Admitting: Physician Assistant

## 2015-10-25 ENCOUNTER — Encounter: Payer: Self-pay | Admitting: Physician Assistant

## 2015-10-25 VITALS — BP 100/64 | HR 84 | Temp 98.2°F | Resp 16 | Ht 66.0 in | Wt 234.0 lb

## 2015-10-25 DIAGNOSIS — I10 Essential (primary) hypertension: Secondary | ICD-10-CM

## 2015-10-25 DIAGNOSIS — F419 Anxiety disorder, unspecified: Secondary | ICD-10-CM | POA: Diagnosis not present

## 2015-10-25 DIAGNOSIS — E118 Type 2 diabetes mellitus with unspecified complications: Secondary | ICD-10-CM

## 2015-10-25 DIAGNOSIS — E1142 Type 2 diabetes mellitus with diabetic polyneuropathy: Secondary | ICD-10-CM | POA: Diagnosis not present

## 2015-10-25 DIAGNOSIS — E785 Hyperlipidemia, unspecified: Secondary | ICD-10-CM | POA: Diagnosis not present

## 2015-10-25 LAB — COMPLETE METABOLIC PANEL WITH GFR
ALBUMIN: 4.5 g/dL (ref 3.6–5.1)
ALK PHOS: 63 U/L (ref 33–130)
ALT: 16 U/L (ref 6–29)
AST: 17 U/L (ref 10–35)
BILIRUBIN TOTAL: 0.3 mg/dL (ref 0.2–1.2)
BUN: 14 mg/dL (ref 7–25)
CALCIUM: 9.6 mg/dL (ref 8.6–10.4)
CHLORIDE: 103 mmol/L (ref 98–110)
CO2: 26 mmol/L (ref 20–31)
CREATININE: 0.77 mg/dL (ref 0.50–1.05)
GFR, Est Non African American: 88 mL/min (ref >=60)
Glucose, Bld: 126 mg/dL — ABNORMAL HIGH (ref 70–99)
Potassium: 5.4 mmol/L — ABNORMAL HIGH (ref 3.5–5.3)
SODIUM: 142 mmol/L (ref 135–146)
TOTAL PROTEIN: 6.8 g/dL (ref 6.1–8.1)

## 2015-10-25 LAB — LIPID PANEL
CHOLESTEROL: 175 mg/dL (ref 125–200)
HDL: 53 mg/dL (ref >=46)
LDL Cholesterol: 94 mg/dL (ref <130)
TRIGLYCERIDES: 142 mg/dL (ref <150)
Total CHOL/HDL Ratio: 3.3 Ratio (ref <=5.0)
VLDL: 28 mg/dL (ref <30)

## 2015-10-25 LAB — HEMOGLOBIN A1C
Hgb A1c MFr Bld: 6.7 % — ABNORMAL HIGH (ref <5.7)
Mean Plasma Glucose: 146 mg/dL

## 2015-10-25 NOTE — Progress Notes (Signed)
Patient ID: SHAUNITA SENEY MRN: [id number], DOB: [birth year], 55 y.o. Date of Encounter: @DATE @  Chief Complaint:  Chief Complaint  Patient presents with  . OTHER    3 month f/u    HPI: 55 y.o. year old white female  presents for routine followup office visit.  At her OV 02/03/13  she reported that her father recently passed away. She reported that he had been very ill for quite some time prior to his death. During that time she was just having to do what she could do to get through each day. Was having to eat whatever she could. Was having to help with transporting her mother back and forth to the hospital et [name]. She was not exercising. At 02/2014 OV she reports that she recently well and her mother to a donor tissue program at [facility]. Says it was very nice and they honored the people who have donated tissues and organs over the past year including her father.  Also at the 02/2013 visit she reported that her husband has lupus and that her daughter had just recently been diagnosed with lupus as well.   She says that she is used to working with seniors and being around seniors.   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  She says that her husband and daughter both see Dr. [name] for their Lupus.   At her OV 08/2013 she reported that her husband was having bilateral knee replacements by Dr. [name] the next day.  At f/u OV she reported that he did have the bilateral knee replacements. She says that he has done excellent. She says that rather than sending him somewhere for a rehabilitation/physical therapy, Dr. [name] allowed him to go home and for her to care for him. She says that he did excellent with this. She was able to cook healthy foods for him and keep him much more comfortable. Says that he is was just able to return to work this week. Says she is delighted to see that  he is able to return to his work which he enjoys and not be in pain.  At prior OV she told me that in the past they have already gone through their 401(k) money to pay for medical expenses. Says that now he recently had to change the mortgage so that they could figure out ways to pay for medical expenses now. Says that all of this has been extremely stressful in addition to fact that like this upcoming week she will need to be out of work to help care for him. As well her mom as well as his mom to keep him busy with them try to help care for them. Said that there was constant stress. However she says that she thinks feels that she is handling it pretty well. Today she seems to be passed the high stress and much better.   At [place] 08/1013 she reported that she recently had been having to take one Xanax every night. Says that otherwise her muscles are extremely tight and she is not sleeping well. Said the Xanax relaxes her muscles and also helps her get to sleep. She never takes it during the day and only takes it at night.  At OV--11/2013--reports that she has not needed her Xanax in over 3 weeks.  Says at this point she would like to just have  them available if she does have a day of high stress and feels uptight and having hard time to relax. However is not needing them on a routine basis like she did in the past.   At [place] says she thinks she has lost some weight since the last visit. says they have been eating lots of vegetables and eating very healthy.  Did check. At her visit 08/2013 weight was 220. Today 11/2013 weight 215. Weight down 5 pounds!!  At 08/2013 office visit-- did lab work-- revealed A1c up to 7.7. At that result note, I said to add Actos 30 mg daily. At [place] 11/2013  She said that she did not start the medicine yet because she was on his talk to me about long-term safety first.  At that 11/2013 OV we did discuss this and she was agreeable to take it if needed. However , her weight was down and  we  rechecked A1c that day --told her she did not have to add Actos. She never had to add Actos.   08/31/2014 OV--She says that she has been feeling good.  (Says that her mom has been having a lot of problems secondary to her Parkinson's). Pt states that she did not add the Actos that I recommended at the last lab 05/25/14. At that time A1c came back 7.2 and recommended to add Actos 15 mg. Today I asked her she was worried about that drug in particular that we have plenty of other options and we can add a different oral medication. She says that she was just concerned that her sugar would get too low. Says that if A1c comes back high again she is agreeable to add this and is fine with taking Actos--Has no concerns about that medication in particular--says was just concerned about BS getting too low.   08/31/2014--A1C was 7.3---At OV 12/07/2014--She reports that she DID add the Actos. Is taking Actos 63m 1 po QD.   She is taking Crestor -- however she does tell me that quite a while back she decreased the dose to taking one half of a pill. Says that she was having significant discomfort in her feet. She wasn't sure if the symptoms were from diabetic neuropathy or not. However she can cut the Crestor in half and says that the symptoms in her feet improved 90%. Says that she was on a half tablet for a long time prior to the labs in 08/2013.  05/25/14, 08/31/2014, 12/07/2014----states still cutting Crestor in half and taking CoQ10.  No symptoms and last FLP was on this dose and excellent.   12/07/2014:  She says that things have been good. Says that the only thing as the GYN added hormones mid and the and progesterone for hot flashes and this is caused weight gain. Says that she is eating the exact same thing she was before but feels like she is just blowing up. Otherwise has been feeling great. Is very excited. Her, her husband, her daughter and son[place]in[place]. Says  that it has been many years since they have gone on any vacation except for just things with family [facility]  03/[facility]/15: She is in very good spirits today. Says a new baby coming to the family. [place]  Says her Gyn Rxed Phenteramine but she wanted to aks me about it before she started it.  Says he is going through  menopause. However because she had an IUD in for years she has been having problems with bleeding. Says that for these reasons her gynecologist has increased the dose of her hormones. This is causing her to have weight gain and therefore GYN prescribed phentermine. Patient states that she is taking the Actos that have been added by me in the past. Is taking all other medications as directed/as listed. She reports that her feet hurt at the end of the day and some of her toes burn. Today I discussed medications for this and initially she was deferring medication but then was agreeable to go ahead and add medicine for this. At that visit--prescribed Neurontin.  06/21/2015: Patient states that she did take the Neurontin but she isn't even having to take it anymore. Says that her feet are not even hurting anymore. Says that her gynecologist still has her on hormones and "everything is better except weight gain". She says that she really doesn't think that she is eating any more than she used to in the past and really does not think that it is causing food cravings but feels like it "is just making her swell up and blow up and gain weight". Did not use any phentermine that the gynecologist prescribed. Reviewed the labs performed here at last visit 03/15/2015 A1c was 7.0 and I recommended her increase Actos to 45 mg. She says she did make that change and is taking the Actos 45 mg. No complaints or concerns today other than the weight gain. Says that they are going to have her mother move in with them. However says that it will be a  transitional process as she has to get her pottery materials from her mom's house and make a space for those --then work on making her mother.  10/25/2015; Says her husband had rotator cuff surgery. He is out of work for 3 months. He is helping with laundry, helping with housework and it is driving her crazy b/c he does everything different than she does.  Says she never took the phenteramine. Again, says not eating any different, not doing anything different, except for the hormones from Gyn. Did finally get building to put her ceramic supplies in so can prepare for mom to move in with them.  Taking Diabetes meds as directed. No adv effects.  Taking statin. No myalgias or other adv effects.  Zoloft still working well. Not feeling anxious or depressed. No adv effects. No complaints/concerns.   Past Medical History  Diagnosis Date  . Diabetes mellitus without complication ([place])   . Hypertension   . Hyperlipidemia   . Anxiety   . Ulcerative colitis ([place]) 05/05/2005     Home Meds:  Outpatient Prescriptions Prior to Visit  Medication Sig Dispense Refill  . ALPRAZolam (XANAX) 0.5 MG tablet Take 1 tablet (0.5 mg total) by mouth 2 (two) times daily as needed for sleep.  60 tablet  2  . aspirin 81 MG tablet Take 81 mg by mouth daily.      . Coenzyme Q10 (COQ10 PO) Take 1 tablet by mouth daily.      . Multiple Vitamin (MULTIVITAMIN) tablet Take 1 tablet by mouth daily. [facility] Women[facility] +50      . ONE TOUCH ULTRA TEST test strip TEST SUGAR TWICE A DAY  50 each  12  . ONETOUCH DELICA LANCETS 40J MISC 1 each by Other route 2 (two) times daily.  100 each  11  . CRESTOR 20 MG tablet TAKE 1 TABLET  BY MOUTH EVERY DAY  30 tablet  0  . metFORMIN (GLUCOPHAGE) 1000 MG tablet TAKE 1 TABLET BY MOUTH TWICE A DAY  60 tablet  5  . sertraline (ZOLOFT) 100 MG tablet TAKE 1 TABLET EVERY DAY   30 tablet  5  . pioglitazone (ACTOS) 30 MG tablet Take 1 tablet (30 mg total) by mouth daily.  30 tablet  2   No  facility-administered medications prior to visit.     Allergies:  Allergies  Allergen Reactions  . Doxycycline Shortness Of Breath    Social History   Social History  . Marital Status: Married    Spouse Name: N/A  . Number of Children: N/A  . Years of Education: N/A   Occupational History  . Not on file.   Social History Main Topics  . Smoking status: Never Smoker   . Smokeless tobacco: Never Used  . Alcohol Use: No  . Drug Use: No  . Sexual Activity: Not on file   Other Topics Concern  . 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   She is married.   Her husband has lupus.   Her daughter was recently diagnosed with lupus.   Her mother has Parkinson's disease. Patient is involved with helping care for her.    History reviewed. No pertinent family history.   Review of Systems:  See HPI for pertinent ROS. All other ROS negative.    Physical Exam: Blood pressure 100/64, pulse 84, temperature 98.2 F (36.8 C), temperature source Oral, resp. rate 16, height 5' 6"  (1.676 m), weight 234 lb (106.142 kg)., Body mass index is 37.79 kg/(m^2). General: Overweight white female .Appears in no acute distress. Neck: Supple. No thyromegaly. No lymphadenopathy. No carotid bruits. Lungs: Clear bilaterally to auscultation without wheezes, rales, or rhonchi. Breathing is unlabored. Heart: RRR with S1 S2. No murmurs, rubs, or gallops. Abdomen: Soft, non-tender, non-distended with normoactive bowel sounds. No hepatomegaly. No rebound/guarding. No obvious abdominal masses. Musculoskeletal:  Strength and tone normal for age. Extremities/Skin: Warm and dry. No clubbing or cyanosis. No edema. No rashes or suspicious lesions. Neuro: Alert and oriented X 3. Moves all extremities spontaneously. Gait is normal. CNII-XII grossly in tact. Psych:  Responds to questions appropriately with a normal affect. Diabetic foot exam:  Inspection is normal. No deformities. No ulcerations or skin breakdown. Sensation is intact to touch and monofilament test bi laterally. She has trace bilateral dorsalis pedis and posterior tibial pulses.      ASSESSMENT AND PLAN:  55 y.o. year old female with   1. Diabetes mellitus type 2 with complications - Hemoglobin A1c  - Microalbumin, urine--done 05/25/14, 10/25/2015   She had diabetic eye exam September 2015. ADDENDUM----- added 01/24/2015--- received note from her ophthalmologist from exam 01/17/2015. Exam showed no diabetic retinopathy.--YAY!! Foot exam is normal--documented in [employer] 08/2014  She is on Aspirin 59m QD She is on statin. FLP excellent 02/2014 LDL 70s She is on no ACE inhibitor  secondary to low blood pressure. I have discussed again today the long-term benefits of renal protection of being on an ACE inhibitor. Discussed trying a very small dose. She defers. Says that she does not want to take any medications that she does not absolutely have to.  Pneumovax 23 given here 08/2013.   2. Diabetic polyneuropathy associated with type 2 diabetes mellitus ([facility] [place]---Presrcibed: Wrote down the following instructions for her:  Day 1: take 1 at night time.    Day 2: take 1 twice daily.    Starting on day 3: take one 3 times daily - gabapentin (NEURONTIN) 300 MG capsule; Take 1 capsule (300 mg total) by mouth 3 (three) times daily.  Dispense: 90 capsule; Refill: 3 At OV 06/21/15-- says that she is no longer needing this medicine and is no longer having pain in her feet even off of medication.  3. Hyperlipidemia She is on Crestor See HPI. She really ONLY TAKES 1/2 TABLET. Lipid panel was excellent at check 10/152015. LFTs normal. She says that taking 1/2 of Crestor in combination with CoQ 10 is perfect for her.    4. BP: Blood pressure is on the low side. Therefore can not add ACE inhibitor. Discussed the renal protection of an ACE inhibitor given her  diabetes. However she does not want to take any medications as she does absolutely does not have to take. She defers this prescription even after I discussed prescribing just a very low dose.  5. Anxiety This is well controlled with Zoloft. Currently, she is rarely needing Xanax. Continue to use this as needed.  6. Mammogram: She has had in the past year per patient report. Up-to-date. Sees Gyn routinely. 02/2014--Reports that she just recently had her annual GYN exam. Reports  GYN added HRT and says that this is controlling her hot flashes and that they are 100% better and she feels so much better now. 05/2014--Reports they removed IUD  7. Colonoscopy: 2007. Repeat 10 years per patient. ---------- at [place] 06/21/15 discussed with her that colonoscopy is due this year. She says that she has already received a reminder / notification from Dr. Collene Mares and patient states that she will call and schedule follow-up there.  8. Immunizations: Influenza vaccine given here 02/16/2014 Tetanus: She reports that this is up-to-date. Pneumonia vaccine:  Pneumovax 23 given here 08/2013.  No further pneumonia vaccine until age 40 Discuss Zostavax at 50.  Weight Gain At OV 06/21/15 I reviewed her prior weights. 08/31/14------------- 216 12/07/14----------------221 03/15/15------------ 225 06/21/15------------ 229 10/25/2015----------234 At OV 06/21/2015--Recommend that she go ahead and take the phentermine daily. Recommend that she check her weight on her home scales today and document that as her baseline weight and then wait 2 weeks to recheck her weight. If weight stable or lower at that time, then continue the phentermine. If weight higher in 2 weeks-- then she needs to call gynecologist and inform them and see about changing the hormones. At [place] 10/25/2015---says she never took phenteramine.  Regular office visit 3 months or sooner if needed.    Marin Olp Crosby, [place], [facility] 10/25/2015 8:38 AM

## 2015-10-26 LAB — MICROALBUMIN, URINE: Microalb, Ur: 0.8 mg/dL

## 2015-11-23 ENCOUNTER — Other Ambulatory Visit: Payer: Self-pay | Admitting: Physician Assistant

## 2015-11-23 NOTE — Telephone Encounter (Signed)
Medication refilled per protocol. 

## 2015-12-07 ENCOUNTER — Other Ambulatory Visit: Payer: Self-pay | Admitting: Physician Assistant

## 2015-12-07 NOTE — Telephone Encounter (Signed)
Refill appropriate and filled per protocol. 

## 2016-01-17 LAB — HM DIABETES EYE EXAM

## 2016-01-31 ENCOUNTER — Ambulatory Visit (INDEPENDENT_AMBULATORY_CARE_PROVIDER_SITE_OTHER): Payer: BLUE CROSS/BLUE SHIELD | Admitting: Physician Assistant

## 2016-01-31 ENCOUNTER — Encounter: Payer: Self-pay | Admitting: Physician Assistant

## 2016-01-31 ENCOUNTER — Encounter (INDEPENDENT_AMBULATORY_CARE_PROVIDER_SITE_OTHER): Payer: Self-pay

## 2016-01-31 VITALS — BP 102/62 | HR 80 | Temp 98.0°F | Resp 16 | Wt 228.0 lb

## 2016-01-31 DIAGNOSIS — I10 Essential (primary) hypertension: Secondary | ICD-10-CM

## 2016-01-31 DIAGNOSIS — E785 Hyperlipidemia, unspecified: Secondary | ICD-10-CM | POA: Diagnosis not present

## 2016-01-31 DIAGNOSIS — Z23 Encounter for immunization: Secondary | ICD-10-CM | POA: Diagnosis not present

## 2016-01-31 DIAGNOSIS — E1142 Type 2 diabetes mellitus with diabetic polyneuropathy: Secondary | ICD-10-CM | POA: Diagnosis not present

## 2016-01-31 DIAGNOSIS — E118 Type 2 diabetes mellitus with unspecified complications: Secondary | ICD-10-CM

## 2016-01-31 DIAGNOSIS — F419 Anxiety disorder, unspecified: Secondary | ICD-10-CM

## 2016-01-31 LAB — HEMOGLOBIN A1C, FINGERSTICK: HEMOGLOBIN A1C, FINGERSTICK: 6.8 % — AB (ref <5.7)

## 2016-01-31 MED ORDER — ALPRAZOLAM 0.5 MG PO TABS: 0.5000 mg | ORAL_TABLET | Freq: Two times a day (BID) | ORAL | 2 refills | Status: DC | PRN

## 2016-01-31 NOTE — Progress Notes (Signed)
Patient ID: Kimberly Solis MRN: [id number], DOB: 14-Dec-1960, 55 y.o. Date of Encounter: @DATE @  Chief Complaint:  Chief Complaint  Patient presents with  . Follow-up    HPI: 55 y.o. year old white female  presents for routine followup office visit.  At her OV 02/03/13  she reported that her father recently passed away. She reported that he had been very ill for quite some time prior to his death. During that time she was just having to do what she could do to get through each day. Was having to eat whatever she could. Was having to help with transporting her mother back and forth to the hospital et [name]. She was not exercising. At 02/2014 OV she reports that she recently well and her mother to a donor tissue program at [facility]. Says it was very nice and they honored the people who have donated tissues and organs over the past year including her father.  Also at the 02/2013 visit she reported that her husband has lupus and that her daughter had just recently been diagnosed with lupus as well.   She says that she is used to working with seniors and being around seniors.   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  She says that her husband and daughter both see Dr. [name] for their Lupus.   At her OV 08/2013 she reported that her husband was having bilateral knee replacements by Dr. [name] the next day.  At f/u OV she reported that he did have the bilateral knee replacements. She says that he has done excellent. She says that rather than sending him somewhere for a rehabilitation/physical therapy, Dr. [name] allowed him to go home and for her to care for him. She says that he did excellent with this. She was able to cook healthy foods for him and keep him much more comfortable. Says that he is was just able to return to work this week. Says she is delighted to see that he is able  to return to his work which he enjoys and not be in pain.  At prior OV she told me that in the past they have already gone through their 401(k) money to pay for medical expenses. Says that now he recently had to change the mortgage so that they could figure out ways to pay for medical expenses now. Says that all of this has been extremely stressful in addition to fact that like this upcoming week she will need to be out of work to help care for him. As well her mom as well as his mom to keep him busy with them try to help care for them. Said that there was constant stress. However she says that she thinks feels that she is handling it pretty well. Today she seems to be passed the high stress and much better.   At [place] 08/1013 she reported that she recently had been having to take one Xanax every night. Says that otherwise her muscles are extremely tight and she is not sleeping well. Said the Xanax relaxes her muscles and also helps her get to sleep. She never takes it during the day and only takes it at night.  At OV--11/2013--reports that she has not needed her Xanax in over 3 weeks.  Says at this point she would like to just have them available if she does have  a day of high stress and feels uptight and having hard time to relax. However is not needing them on a routine basis like she did in the past.   At [place] says she thinks she has lost some weight since the last visit. says they have been eating lots of vegetables and eating very healthy.  Did check. At her visit 08/2013 weight was 220. Today 11/2013 weight 215. Weight down 5 pounds!!  At 08/2013 office visit-- did lab work-- revealed A1c up to 7.7. At that result note, I said to add Actos 30 mg daily. At [place] 11/2013  She said that she did not start the medicine yet because she was on his talk to me about long-term safety first.  At that 11/2013 OV we did discuss this and she was agreeable to take it if needed. However , her weight was down and we   rechecked A1c that day --told her she did not have to add Actos. She never had to add Actos.   08/31/2014 OV--She says that she has been feeling good.  (Says that her mom has been having a lot of problems secondary to her Parkinson's). Pt states that she did not add the Actos that I recommended at the last lab 05/25/14. At that time A1c came back 7.2 and recommended to add Actos 15 mg. Today I asked her she was worried about that drug in particular that we have plenty of other options and we can add a different oral medication. She says that she was just concerned that her sugar would get too low. Says that if A1c comes back high again she is agreeable to add this and is fine with taking Actos--Has no concerns about that medication in particular--says was just concerned about BS getting too low.   08/31/2014--A1C was 7.3---At OV 12/07/2014--She reports that she DID add the Actos. Is taking Actos 64m 1 po QD.   She is taking Crestor -- however she does tell me that quite a while back she decreased the dose to taking one half of a pill. Says that she was having significant discomfort in her feet. She wasn't sure if the symptoms were from diabetic neuropathy or not. However she can cut the Crestor in half and says that the symptoms in her feet improved 90%. Says that she was on a half tablet for a long time prior to the labs in 08/2013.  05/25/14, 08/31/2014, 12/07/2014----states still cutting Crestor in half and taking CoQ10.  No symptoms and last FLP was on this dose and excellent.   12/07/2014:  She says that things have been good. Says that the only thing as the GYN added hormones mid and the and progesterone for hot flashes and this is caused weight gain. Says that she is eating the exact same thing she was before but feels like she is just blowing up. Otherwise has been feeling great. Is very excited. Her, her husband, her daughter and son[place]in[place]. Says that it  has been many years since they have gone on any vacation except for just things with family or [facility]  03/15/15: She is in very good spirits today. Says a new baby coming to the family. [place]  Says her Gyn Rxed Phenteramine but she wanted to aks me about it before she started it.  Says he is going through menopause. However because she had an  IUD in for years she has been having problems with bleeding. Says that for these reasons her gynecologist has increased the dose of her hormones. This is causing her to have weight gain and therefore GYN prescribed phentermine. Patient states that she is taking the Actos that have been added by me in the past. Is taking all other medications as directed/as listed. She reports that her feet hurt at the end of the day and some of her toes burn. Today I discussed medications for this and initially she was deferring medication but then was agreeable to go ahead and add medicine for this. At that visit--prescribed Neurontin.  06/21/2015: Patient states that she did take the Neurontin but she isn't even having to take it anymore. Says that her feet are not even hurting anymore. Says that her gynecologist still has her on hormones and "everything is better except weight gain". She says that she really doesn't think that she is eating any more than she used to in the past and really does not think that it is causing food cravings but feels like it "is just making her swell up and blow up and gain weight". Did not use any phentermine that the gynecologist prescribed. Reviewed the labs performed here at last visit 03/15/2015 A1c was 7.0 and I recommended her increase Actos to 45 mg. She says she did make that change and is taking the Actos 45 mg. No complaints or concerns today other than the weight gain. Says that they are going to have her mother move in with them. However says that it will be a  transitional process as she has to get her pottery materials from her mom's house and make a space for those --then work on making her mother.  10/25/2015; Says her husband had rotator cuff surgery. He is out of work for 3 months. He is helping with laundry, helping with housework and it is driving her crazy b/c he does everything different than she does.  Says she never took the phenteramine. Again, says not eating any different, not doing anything different, except for the hormones from Gyn. Did finally get building to put her ceramic supplies in so can prepare for mom to move in with them.  01/31/2016: Asks what her weight is today compared to last visit. Reviewed that her weight is actually down a few pounds. She says that she is so glad. Says that since these hormone issues with GYN she has really struggled with her weight. Says that she has been eating extremely healthy and making sure not to eat anything after 6:30 PM. Says that she has her annual physical with GYN next Friday. Says that she does need a refill on her Xanax. Says that she only uses it occasionally at night about once a week. Taking Diabetes meds as directed. No adv effects.  Taking statin. No myalgias or other adv effects.  Zoloft still working well. Not feeling anxious or depressed. No adv effects. No complaints/concerns.   Past Medical History:  Diagnosis Date  . Anxiety   . Diabetes mellitus without complication ([place])   . Hyperlipidemia   . Hypertension   . Ulcerative colitis ([place]) 05/05/2005     Home Meds:  Outpatient Prescriptions Prior to Visit  Medication Sig Dispense Refill  . ALPRAZolam (XANAX) 0.5 MG tablet Take 1 tablet (0.5 mg total) by mouth 2 (two) times daily as needed for sleep.  60 tablet  2  . aspirin 81 MG tablet Take 81 mg by  mouth daily.      . Coenzyme Q10 (COQ10 PO) Take 1 tablet by mouth daily.      . Multiple Vitamin (MULTIVITAMIN) tablet Take 1 tablet by mouth daily. [facility] +50      .  ONE TOUCH ULTRA TEST test strip TEST SUGAR TWICE A DAY  50 each  12  . ONETOUCH DELICA LANCETS 09Q MISC 1 each by Other route 2 (two) times daily.  100 each  11  . CRESTOR 20 MG tablet TAKE 1 TABLET BY MOUTH EVERY DAY  30 tablet  0  . metFORMIN (GLUCOPHAGE) 1000 MG tablet TAKE 1 TABLET BY MOUTH TWICE A DAY  60 tablet  5  . sertraline (ZOLOFT) 100 MG tablet TAKE 1 TABLET EVERY DAY   30 tablet  5  . pioglitazone (ACTOS) 30 MG tablet Take 1 tablet (30 mg total) by mouth daily.  30 tablet  2   No facility-administered medications prior to visit.     Allergies:  Allergies  Allergen Reactions  . Doxycycline Shortness Of Breath    Social History   Social History  . Marital status: Married    Spouse name: N/A  . Number of children: N/A  . Years of education: N/A   Occupational History  . Not on file.   Social History Main Topics  . Smoking status: Never Smoker  . Smokeless tobacco: Never Used  . Alcohol use No  . Drug use: No  . Sexual activity: Not on file   Other Topics Concern  . 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   She is married.   Her husband has lupus.   Her daughter was recently diagnosed with lupus.   Her mother has Parkinson's disease. Patient is involved with helping care for her.    No family history on file.   Review of Systems:  See HPI for pertinent ROS. All other ROS negative.    Physical Exam: Blood pressure 102/62, pulse 80, temperature 98 F (36.7 C), temperature source Oral, resp. rate 16, weight 228 lb (103.4 kg)., There is no height or weight on file to calculate BMI. General: Overweight white female .Appears in no acute distress. Neck: Supple. No thyromegaly. No lymphadenopathy. No carotid bruits. Lungs: Clear bilaterally to auscultation without wheezes, rales, or rhonchi. Breathing is unlabored. Heart: RRR with S1 S2. No murmurs, rubs, or gallops. Abdomen: Soft,  non-tender, non-distended with normoactive bowel sounds. No hepatomegaly. No rebound/guarding. No obvious abdominal masses. Musculoskeletal:  Strength and tone normal for age. Extremities/Skin: Warm and dry. No clubbing or cyanosis. No edema. No rashes or suspicious lesions. Neuro: Alert and oriented X 3. Moves all extremities spontaneously. Gait is normal. CNII-XII grossly in tact. Psych:  Responds to questions appropriately with a normal affect. Diabetic foot exam: Inspection is normal. No deformities. No ulcerations or skin breakdown. Sensation is intact to touch and monofilament test bi laterally. She has trace bilateral dorsalis pedis and posterior tibial pulses.      ASSESSMENT AND PLAN:  55 y.o. year old female with   1. Diabetes mellitus type 2 with complications - Hemoglobin A1c  - Microalbumin, urine--done 05/25/14, 10/25/2015   She had diabetic eye exam  ADDENDUM----- added 01/24/2015--- received note from her ophthalmologist from exam 01/17/2015. Exam showed no diabetic retinopathy.--YAY!! Foot exam is normal--documented in [employer] 08/2014  She is on Aspirin 52m QD She is on statin. FLP excellent 02/2014  LDL 70s She is on no ACE inhibitor  secondary to low blood pressure. I have discussed again today the long-term benefits of renal protection of being on an ACE inhibitor. Discussed trying a very small dose. She defers. Says that she does not want to take any medications that she does not absolutely have to.  Pneumovax 23 given here 08/2013.   2. Diabetic polyneuropathy associated with type 2 diabetes mellitus ([place]) At [place] 03/21/2015---Presrcibed: Wrote down the following instructions for her:    Day 1: take 1 at night time.    Day 2: take 1 twice daily.    Starting on day 3: take one 3 times daily - gabapentin (NEURONTIN) 300 MG capsule; Take 1 capsule (300 mg total) by mouth 3 (three) times daily.  Dispense: 90 capsule; Refill: 3 At OV 06/21/15-- says that she is no  longer needing this medicine and is no longer having pain in her feet even off of medication.  3. Hyperlipidemia She is on Crestor See HPI. She really ONLY TAKES 1/2 TABLET. Lipid panel was excellent at check 10/152015. LFTs normal. She says that taking 1/2 of Crestor in combination with CoQ 10 is perfect for her.    4. BP: Blood pressure is on the low side. Therefore can not add ACE inhibitor. Discussed the renal protection of an ACE inhibitor given her diabetes. However she does not want to take any medications as she does absolutely does not have to take. She defers this prescription even after I discussed prescribing just a very low dose.  5. Anxiety This is well controlled with Zoloft. Currently, she is rarely needing Xanax. Continue to use this as needed.  6. Mammogram: She has had in the past year per patient report. Up-to-date. Sees Gyn routinely. 02/2014--Reports that she just recently had her annual GYN exam. Reports  GYN added HRT and says that this is controlling her hot flashes and that they are 100% better and she feels so much better now. 05/2014--Reports they removed IUD  7. Colonoscopy: 2007. Repeat 10 years per patient. ---------- at [place] 06/21/15 discussed with her that colonoscopy is due this year. She says that she has already received a reminder / notification from Dr. Collene Mares and patient states that she will call and schedule follow-up there.  8. Immunizations: Influenza vaccine given here 02/16/2014, 01/31/2016 Tetanus: She reports that this is up-to-date. Pneumonia vaccine:  Pneumovax 23 given here 08/2013.  No further pneumonia vaccine until age 16 Discuss Zostavax at 6.  Weight Gain At OV 06/21/15 I reviewed her prior weights. 08/31/14------------- 216 12/07/14----------------221 03/15/15------------ 225 06/21/15------------ 229 10/25/2015----------234 01/31/2016----------228 At OV 06/21/2015--Recommend that she go ahead and take the phentermine daily. Recommend that  she check her weight on her home scales today and document that as her baseline weight and then wait 2 weeks to recheck her weight. If weight stable or lower at that time, then continue the phentermine. If weight higher in 2 weeks-- then she needs to call gynecologist and inform them and see about changing the hormones. At [place] 10/25/2015---says she never took phenteramine.  Regular office visit 3 months or sooner if needed.    Signed, [address] [place], [place], [facility] 01/31/2016 8:06 AM

## 2016-01-31 NOTE — Addendum Note (Signed)
Addended by: Vonna Kotyk A on: 01/31/2016 09:03 AM   Modules accepted: Orders

## 2016-02-21 ENCOUNTER — Encounter: Payer: Self-pay | Admitting: Family Medicine

## 2016-03-10 ENCOUNTER — Other Ambulatory Visit: Payer: Self-pay | Admitting: Physician Assistant

## 2016-03-10 NOTE — Telephone Encounter (Signed)
Rx refilled per protocol

## 2016-03-17 ENCOUNTER — Other Ambulatory Visit: Payer: Self-pay | Admitting: Physician Assistant

## 2016-03-17 NOTE — Telephone Encounter (Signed)
Rx refilled per protocol

## 2016-05-08 ENCOUNTER — Ambulatory Visit: Payer: BLUE CROSS/BLUE SHIELD | Admitting: Physician Assistant

## 2016-05-18 ENCOUNTER — Other Ambulatory Visit: Payer: Self-pay | Admitting: Physician Assistant

## 2016-05-25 ENCOUNTER — Other Ambulatory Visit: Payer: Self-pay | Admitting: Physician Assistant

## 2016-05-26 NOTE — Telephone Encounter (Signed)
Rx filled per protocol  

## 2016-05-29 ENCOUNTER — Encounter: Payer: Self-pay | Admitting: Physician Assistant

## 2016-05-29 ENCOUNTER — Ambulatory Visit (INDEPENDENT_AMBULATORY_CARE_PROVIDER_SITE_OTHER): Payer: BLUE CROSS/BLUE SHIELD | Admitting: Physician Assistant

## 2016-05-29 VITALS — BP 118/78 | HR 83 | Temp 98.0°F | Resp 16 | Wt 231.4 lb

## 2016-05-29 DIAGNOSIS — E118 Type 2 diabetes mellitus with unspecified complications: Secondary | ICD-10-CM | POA: Diagnosis not present

## 2016-05-29 DIAGNOSIS — F419 Anxiety disorder, unspecified: Secondary | ICD-10-CM

## 2016-05-29 DIAGNOSIS — E0842 Diabetes mellitus due to underlying condition with diabetic polyneuropathy: Secondary | ICD-10-CM

## 2016-05-29 DIAGNOSIS — J029 Acute pharyngitis, unspecified: Secondary | ICD-10-CM

## 2016-05-29 DIAGNOSIS — E785 Hyperlipidemia, unspecified: Secondary | ICD-10-CM | POA: Diagnosis not present

## 2016-05-29 DIAGNOSIS — K51919 Ulcerative colitis, unspecified with unspecified complications: Secondary | ICD-10-CM | POA: Diagnosis not present

## 2016-05-29 DIAGNOSIS — I10 Essential (primary) hypertension: Secondary | ICD-10-CM | POA: Diagnosis not present

## 2016-05-29 LAB — LIPID PANEL
CHOL/HDL RATIO: 3.3 ratio (ref <5.0)
Cholesterol: 166 mg/dL (ref <200)
HDL: 51 mg/dL (ref >50)
LDL Cholesterol: 79 mg/dL (ref <100)
TRIGLYCERIDES: 178 mg/dL — AB (ref <150)
VLDL: 36 mg/dL — ABNORMAL HIGH (ref <30)

## 2016-05-29 LAB — COMPLETE METABOLIC PANEL WITH GFR
ALT: 15 U/L (ref 6–29)
AST: 16 U/L (ref 10–35)
Albumin: 4.4 g/dL (ref 3.6–5.1)
Alkaline Phosphatase: 65 U/L (ref 33–130)
BILIRUBIN TOTAL: 0.3 mg/dL (ref 0.2–1.2)
BUN: 16 mg/dL (ref 7–25)
CHLORIDE: 103 mmol/L (ref 98–110)
CO2: 24 mmol/L (ref 20–31)
Calcium: 9.7 mg/dL (ref 8.6–10.4)
Creat: 0.64 mg/dL (ref 0.50–1.05)
GFR, Est African American: >89 mL/min (ref >=60)
GFR, Est Non African American: >89 mL/min (ref >=60)
GLUCOSE: 123 mg/dL — AB (ref 70–99)
Potassium: 4.9 mmol/L (ref 3.5–5.3)
Sodium: 141 mmol/L (ref 135–146)
TOTAL PROTEIN: 6.9 g/dL (ref 6.1–8.1)

## 2016-05-29 MED ORDER — AMOXICILLIN-POT CLAVULANATE 875-125 MG PO TABS
1.0000 | ORAL_TABLET | Freq: Two times a day (BID) | ORAL | 0 refills | Status: DC
Start: 2016-05-29 — End: 2016-09-11

## 2016-05-29 NOTE — Progress Notes (Signed)
Patient ID: Kimberly Solis MRN: [id number], DOB: Mar 03, 1961, 56 y.o. Date of Encounter: @DATE @  Chief Complaint:  Chief Complaint  Patient presents with  . Hypertension  . Diabetes  . sore on right side of mouth    HPI: 56 y.o. year old white female  presents for routine followup office visit.  At her OV 02/03/13  she reported that her father recently passed away. She reported that he had been very ill for quite some time prior to his death. During that time she was just having to do what she could do to get through each day. Was having to eat whatever she could. Was having to help with transporting her mother back and forth to the hospital et [name]. She was not exercising. At 02/2014 OV she reports that she recently well and her mother to a donor tissue program at [facility]. Says it was very nice and they honored the people who have donated tissues and organs over the past year including her father.  Also at the 02/2013 visit she reported that her husband has lupus and that her daughter had just recently been diagnosed with lupus as well.   She says that she is used to working with seniors and being around seniors.   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  She says that her husband and daughter both see Dr. [name] for their Lupus.   At her OV 08/2013 she reported that her husband was having bilateral knee replacements by Dr. [name] the next day.  At f/u OV she reported that he did have the bilateral knee replacements. She says that he has done excellent. She says that rather than sending him somewhere for a rehabilitation/physical therapy, Dr. [name] allowed him to go home and for her to care for him. She says that he did excellent with this. She was able to cook healthy foods for him and keep him much more comfortable. Says that he is was just able to return to work this  week. Says she is delighted to see that he is able to return to his work which he enjoys and not be in pain.  At prior OV she told me that in the past they have already gone through their 401(k) money to pay for medical expenses. Says that now he recently had to change the mortgage so that they could figure out ways to pay for medical expenses now. Says that all of this has been extremely stressful in addition to fact that like this upcoming week she will need to be out of work to help care for him. As well her mom as well as his mom to keep him busy with them try to help care for them. Said that there was constant stress. However she says that she thinks feels that she is handling it pretty well. Today she seems to be passed the high stress and much better.   At [place] 08/1013 she reported that she recently had been having to take one Xanax every night. Says that otherwise her muscles are extremely tight and she is not sleeping well. Said the Xanax relaxes her muscles and also helps her get to sleep. She never takes it during the day and only takes it at night.  At OV--11/2013--reports that she has not needed her Xanax in over 3 weeks.  Says at this point she  would like to just have them available if she does have a day of high stress and feels uptight and having hard time to relax. However is not needing them on a routine basis like she did in the past.   At [place] says she thinks she has lost some weight since the last visit. says they have been eating lots of vegetables and eating very healthy.  Did check. At her visit 08/2013 weight was 220. Today 11/2013 weight 215. Weight down 5 pounds!!  At 08/2013 office visit-- did lab work-- revealed A1c up to 7.7. At that result note, I said to add Actos 30 mg daily. At [place] 11/2013  She said that she did not start the medicine yet because she was on his talk to me about long-term safety first.  At that 11/2013 OV we did discuss this and she was agreeable to take it if  needed. However , her weight was down and we  rechecked A1c that day --told her she did not have to add Actos. She never had to add Actos.   08/31/2014 OV--She says that she has been feeling good.  (Says that her mom has been having a lot of problems secondary to her Parkinson's). Pt states that she did not add the Actos that I recommended at the last lab 05/25/14. At that time A1c came back 7.2 and recommended to add Actos 15 mg. Today I asked her she was worried about that drug in particular that we have plenty of other options and we can add a different oral medication. She says that she was just concerned that her sugar would get too low. Says that if A1c comes back high again she is agreeable to add this and is fine with taking Actos--Has no concerns about that medication in particular--says was just concerned about BS getting too low.   08/31/2014--A1C was 7.3---At OV 12/07/2014--She reports that she DID add the Actos. Is taking Actos 51m 1 po QD.   She is taking Crestor -- however she does tell me that quite a while back she decreased the dose to taking one half of a pill. Says that she was having significant discomfort in her feet. She wasn't sure if the symptoms were from diabetic neuropathy or not. However she can cut the Crestor in half and says that the symptoms in her feet improved 90%. Says that she was on a half tablet for a long time prior to the labs in 08/2013.  05/25/14, 08/31/2014, 12/07/2014----states still cutting Crestor in half and taking CoQ10.  No symptoms and last FLP was on this dose and excellent.   12/07/2014:  She says that things have been good. Says that the only thing as the GYN added hormones mid and the and progesterone for hot flashes and this is caused weight gain. Says that she is eating the exact same thing she was before but feels like she is just blowing up. Otherwise has been feeling great. Is very excited. Her, her husband, her daughter and son-in[place]. Says that it has been many years since they have gone on any vacation except for just things with family [facility]  03/15/15: She is in very good spirits today. Says a new baby coming to the family. [place]  Says her Gyn Rxed Phenteramine but she wanted to aks me about it before she started it.  Says he is going through menopause. However because she had an IUD in for years she has been having problems with bleeding. Says that for these reasons her gynecologist has increased the dose of her hormones. This is causing her to have weight gain and therefore GYN prescribed phentermine. Patient states that she is taking the Actos that have been added by me in the past. Is taking all other medications as directed/as listed. She reports that her feet hurt at the end of the day and some of her toes burn. Today I discussed medications for this and initially she was deferring medication but then was agreeable to go ahead and add medicine for this. At that visit--prescribed Neurontin.  06/21/2015: Patient states that she did take the Neurontin but she isn't even having to take it anymore. Says that her feet are not even hurting anymore. Says that her gynecologist still has her on hormones and "everything is better except weight gain". She says that she really doesn't think that she is eating any more than she used to in the past and really does not think that it is causing food cravings but feels like it "is just making her swell up and blow up and gain weight". Did not use any phentermine that the gynecologist prescribed. Reviewed the labs performed here at last visit 03/15/2015 A1c was 7.0 and I recommended her increase Actos to 45 mg. She says she did make that change and is taking the Actos 45 mg. No complaints or concerns today other than the weight gain. Says that they are going to have her mother move in  with them. However says that it will be a transitional process as she has to get her pottery materials from her mom's house and make a space for those --then work on making her mother.  10/25/2015; Says her husband had rotator cuff surgery. He is out of work for 3 months. He is helping with laundry, helping with housework and it is driving her crazy b/c he does everything different than she does.  Says she never took the phenteramine. Again, says not eating any different, not doing anything different, except for the hormones from Gyn. Did finally get building to put her ceramic supplies in so can prepare for mom to move in with them.  01/31/2016: Asks what her weight is today compared to last visit. Reviewed that her weight is actually down a few pounds. She says that she is so glad. Says that since these hormone issues with GYN she has really struggled with her weight. Says that she has been eating extremely healthy and making sure not to eat anything after 6:30 PM. Says that she has her annual physical with GYN next Friday. Says that she does need a refill on her Xanax. Says that she only uses it occasionally at night about once a week. Taking Diabetes meds as directed. No adv effects.  Taking statin. No myalgias or other adv effects.  Zoloft still working well. Not feeling anxious or depressed. No adv effects.  05/29/2016: Today she reports that even in the very cold temperatures they moved her pottery and have made that space and has been working on that project. Also constantly helping with her mother who has Parkinson's and caring for her. She reports that she has had sore area in the back of her right throat that started sometime in December. Says that in the beginning she could feel something that felt like a little skin tag that she Scratched  off but still has some sore areas with redness around them. Right side of neck feels a little bit sore as well. No other specific concerns to address  today. Taking Diabetes meds as directed. No adv effects.  Taking statin. No myalgias or other adv effects.  Zoloft still working well. Not feeling anxious or depressed. No adv effects.     Past Medical History:  Diagnosis Date  . Anxiety   . Diabetes mellitus without complication ([place])   . Hyperlipidemia   . Hypertension   . Ulcerative colitis ([place]) 05/05/2005     Home Meds:  Outpatient Prescriptions Prior to Visit  Medication Sig Dispense Refill  . ALPRAZolam (XANAX) 0.5 MG tablet Take 1 tablet (0.5 mg total) by mouth 2 (two) times daily as needed for sleep.  60 tablet  2  . aspirin 81 MG tablet Take 81 mg by mouth daily.      . Coenzyme Q10 (COQ10 PO) Take 1 tablet by mouth daily.      . Multiple Vitamin (MULTIVITAMIN) tablet Take 1 tablet by mouth daily. [facility] Women[facility] +50      . ONE TOUCH ULTRA TEST test strip TEST SUGAR TWICE A DAY  50 each  12  . ONETOUCH DELICA LANCETS 46E MISC 1 each by Other route 2 (two) times daily.  100 each  11  . CRESTOR 20 MG tablet TAKE 1 TABLET BY MOUTH EVERY DAY  30 tablet  0  . metFORMIN (GLUCOPHAGE) 1000 MG tablet TAKE 1 TABLET BY MOUTH TWICE A DAY  60 tablet  5  . sertraline (ZOLOFT) 100 MG tablet TAKE 1 TABLET EVERY DAY   30 tablet  5  . pioglitazone (ACTOS) 30 MG tablet Take 1 tablet (30 mg total) by mouth daily.  30 tablet  2   No facility-administered medications prior to visit.     Allergies:  Allergies  Allergen Reactions  . Doxycycline Shortness Of Breath    Social History   Social History  . Marital status: Married    Spouse name: N/A  . Number of children: N/A  . Years of education: N/A   Occupational History  . Not on file.   Social History Main Topics  . Smoking status: Never Smoker  . Smokeless tobacco: Never Used  . Alcohol use No  . Drug use: No  . Sexual activity: Not on file   Other Topics Concern  . 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    She is married.   Her husband has lupus.   Her daughter was recently diagnosed with lupus.   Her mother has Parkinson's disease. Patient is involved with helping care for her.    No family history on file.   Review of Systems:  See HPI for pertinent ROS. All other ROS negative.    Physical Exam: Blood pressure 118/78, pulse 83, temperature 98 F (36.7 C), temperature source Oral, resp. rate 16, weight 231 lb 6.4 oz (105 kg), SpO2 99 %., Body mass index is 37.35 kg/m. General: Overweight white female .Appears in no acute distress. HEENT: Right posterior pharynx: There are 2 - 3 small shallow ulcerative lesions ( each ~13m diameter). There is surrounding erythema. This is all within ~ 0.5 inch diameter area. Right Neck: No palpable enlarged lymph node but she reports mild tenderness with palpation of right tonsillar node.  Neck: Supple. No thyromegaly. No carotid bruits. Lungs: Clear bilaterally to  auscultation without wheezes, rales, or rhonchi. Breathing is unlabored. Heart: RRR with S1 S2. No murmurs, rubs, or gallops. Abdomen: Soft, non-tender, non-distended with normoactive bowel sounds. No hepatomegaly. No rebound/guarding. No obvious abdominal masses. Musculoskeletal:  Strength and tone normal for age. Extremities/Skin: Warm and dry. No clubbing or cyanosis. No edema. No rashes or suspicious lesions. Neuro: Alert and oriented X 3. Moves all extremities spontaneously. Gait is normal. CNII-XII grossly in tact. Psych:  Responds to questions appropriately with a normal affect. Diabetic foot exam: Inspection is normal. No deformities. No ulcerations or skin breakdown. Sensation is intact to touch and monofilament test bi laterally. She has trace bilateral dorsalis pedis and posterior tibial pulses.      ASSESSMENT AND PLAN:  56 y.o. year old female with   Acute pharyngitis, unspecified etiology Looks like she may have had small vesicles that have now turned into ulcerative  lesions. Appears viral. Instead of HSV. However she says that she has not had similar lesions there in the past. However given duration will treat with antibiotic and have her come in for follow-up visit in 2 weeks to reevaluate. If site not resolved at follow-up in 2 weeks then will refer to ENT. - amoxicillin-clavulanate (AUGMENTIN) 875-125 MG tablet; Take 1 tablet by mouth 2 (two) times daily.  Dispense: 20 tablet; Refill: 0   1. Diabetes mellitus type 2 with complications - Hemoglobin A1c  - Microalbumin, urine--done 05/25/14, 10/25/2015   She had diabetic eye exam  ADDENDUM----- added 01/24/2015--- received note from her ophthalmologist from exam 01/17/2015. Exam showed no diabetic retinopathy.--YAY!! Foot exam is normal--documented in [employer] [facility]rics 08/2014  She is on Aspirin 11m QD She is on statin. FLP excellent 02/2014 LDL 70s She is on no ACE inhibitor  secondary to low blood pressure. I have discussed again today the long-term benefits of renal protection of being on an ACE inhibitor. Discussed trying a very small dose. She defers. Says that she does not want to take any medications that she does not absolutely have to.  Pneumovax 23 given here 08/2013.  05/29/2016: Check  A1c and CMET to monitor  2. Diabetic polyneuropathy associated with type 2 diabetes mellitus ([place]/2016---Presrcibed: Wrote down the following instructions for her:    Day 1: take 1 at night time.    Day 2: take 1 twice daily.    Starting on day 3: take one 3 times daily - gabapentin (NEURONTIN) 300 MG capsule; Take 1 capsule (300 mg total) by mouth 3 (three) times daily.  Dispense: 90 capsule; Refill: 3 At OV 06/21/15-- says that she is no longer needing this medicine and is no longer having pain in her feet even off of medication.  3. Hyperlipidemia She is on Crestor See HPI. She really ONLY TAKES 1/2 TABLET. Lipid panel was excellent at check 10/152015. LFTs normal. She says that taking 1/2 of  Crestor in combination with CoQ 10 is perfect for her.  05/29/2016: Taking med as above. Recheck lipid panel and LFT today to monitor.  4. BP: Blood pressure is on the low side. Therefore can not add ACE inhibitor. Discussed the renal protection of an ACE inhibitor given her diabetes. However she does not want to take any medications as she does absolutely does not have to take. She defers this prescription even after I discussed prescribing just a very low dose. 05/29/2016: Pressure is well controlled at goal today.  5. Anxiety This is well controlled with Zoloft. Currently, she is rarely  needing Xanax. Continue to use this as needed. 05/29/2016:  stable/controlled with current medication. Taking Zoloft daily and rarely uses Xanax.  6. Mammogram: She has had in the past year per patient report. Up-to-date. Sees Gyn routinely. 02/2014--Reports that she just recently had her annual GYN exam. Reports  GYN added HRT and says that this is controlling her hot flashes and that they are 100% better and she feels so much better now. 05/2014--Reports they removed IUD  7. Colonoscopy: 2007. Repeat 10 years per patient. ---------- at [place] 06/21/15 discussed with her that colonoscopy is due this year. She says that she has already received a reminder / notification from Dr. Collene Mares and patient states that she will call and schedule follow-up there.  8. Immunizations: Influenza vaccine given here 02/16/2014, 01/31/2016 Tetanus: She reports that this is up-to-date. Pneumonia vaccine:  Pneumovax 23 given here 08/2013.  No further pneumonia vaccine until age 57 Discuss Zostavax at 5.  Weight Gain At OV 06/21/15 I reviewed her prior weights. 08/31/14------------- 216 12/07/14----------------221 03/15/15------------ 225 06/21/15------------ 229 10/25/2015----------234 01/31/2016----------228 At OV 06/21/2015--Recommend that she go ahead and take the phentermine daily. Recommend that she check her weight on her home  scales today and document that as her baseline weight and then wait 2 weeks to recheck her weight. If weight stable or lower at that time, then continue the phentermine. If weight higher in 2 weeks-- then she needs to call gynecologist and inform them and see about changing the hormones. At [place] 10/25/2015---says she never took phenteramine.   Follow up office visit 2 weeks to re-evaluate Right Pharynx.    Signed, [address] [place], [place], [facility] 05/29/2016 9:03 AM

## 2016-05-30 LAB — HEMOGLOBIN A1C
Hgb A1c MFr Bld: 6.3 % — ABNORMAL HIGH (ref <5.7)
MEAN PLASMA GLUCOSE: 134 mg/dL

## 2016-06-08 ENCOUNTER — Other Ambulatory Visit: Payer: Self-pay | Admitting: Physician Assistant

## 2016-06-09 NOTE — Telephone Encounter (Signed)
Refill appropriate 

## 2016-06-10 ENCOUNTER — Ambulatory Visit (INDEPENDENT_AMBULATORY_CARE_PROVIDER_SITE_OTHER): Payer: BLUE CROSS/BLUE SHIELD | Admitting: Orthopaedic Surgery

## 2016-06-10 ENCOUNTER — Encounter (INDEPENDENT_AMBULATORY_CARE_PROVIDER_SITE_OTHER): Payer: Self-pay | Admitting: Orthopaedic Surgery

## 2016-06-10 ENCOUNTER — Ambulatory Visit (INDEPENDENT_AMBULATORY_CARE_PROVIDER_SITE_OTHER): Payer: Self-pay

## 2016-06-10 DIAGNOSIS — M25511 Pain in right shoulder: Secondary | ICD-10-CM | POA: Diagnosis not present

## 2016-06-10 DIAGNOSIS — M7541 Impingement syndrome of right shoulder: Secondary | ICD-10-CM | POA: Diagnosis not present

## 2016-06-10 MED ORDER — LIDOCAINE HCL 1 % IJ SOLN
3.0000 mL | INTRAMUSCULAR | Status: AC | PRN
  Administered 2016-06-10: 3 mL

## 2016-06-10 MED ORDER — METHYLPREDNISOLONE ACETATE 40 MG/ML IJ SUSP
40.0000 mg | INTRAMUSCULAR | Status: AC | PRN
  Administered 2016-06-10: 40 mg via INTRA_ARTICULAR

## 2016-06-10 NOTE — Progress Notes (Signed)
Office Visit Note   Patient: Kimberly Solis           Date of Birth: [birth year]           MRN: [id number] Visit Date: 06/10/2016              Requested by: Orlena Sheldon, PA-C [place], [place] [id number] PCP: Karis Juba, PA-C   Assessment & Plan: Visit Diagnoses:  1. Acute pain of right shoulder     Plan: She tolerated the steroid injection in her right shoulder subacromial space well. She is work on range of motion and strengthening of her shoulder. I'll see her back in 2 weeks to see how she doing overall. If I'm still suspicious of rotator cuff deficiency of the shoulder we will need to obtain an MRI.  Follow-Up Instructions: Return in about 2 weeks (around 06/24/2016).   Orders:  Orders Placed This Encounter  Procedures  . Large Joint Injection/Arthrocentesis  . XR Shoulder Right   No orders of the defined types were placed in this encounter.     Procedures: Large Joint Inj Date/Time: 06/10/2016 8:44 AM Performed by: Mcarthur Rossetti Authorized by: Mcarthur Rossetti   Location:  Shoulder Site:  R subacromial bursa Ultrasound Guidance: No   Fluoroscopic Guidance: No   Arthrogram: No   Medications:  3 mL lidocaine 1 %; 40 mg methylPREDNISolone acetate 40 MG/ML     Clinical Data: No additional findings.   Subjective: Chief Complaint  Patient presents with  . Right Shoulder - Pain    HPI The patient is a 56 year old right-hand-dominant female who comes in with right shoulder pain. She actually had a fall remotely landing on that right shoulder but also injuring her left knee. The left knee was more of a distracting injury and she went for a while without drilling with the shoulder. She eventually found out she had a "crack" in the humeral head. She is gotten to where now the shoulder is weak. It hurts with overhead activities and reaching behind her. It's detrimentally affected her activity is daily living and her quality of  life. It is waking her up at night as well. She's had no other treatment for the shoulder. He is here for further evaluation and treatment of her right shoulder pain and weakness. Review of Systems She denies any chest pain, headache, shortness of breath, fever, chills, nausea, vomiting.  Objective: Vital Signs: There were no vitals taken for this visit.  Physical Exam She is alert and oriented 3 and in no acute distress Ortho Exam Examination of her right shoulder shows limitations with abduction as well as external rotation. Her internal rotation with adduction is also quite limited. She has negative liftoff test definitely shows weakness with rotator cuff with abduction and rotation. It's 4 minus out of 5 in terms of strength. She is using her deltoids to abduct her shoulder more so as well. Specialty Comments:  No specialty comments available.  Imaging: Xr Shoulder Right  Result Date: 06/10/2016 3 views of her right shoulder obtained and show well located shoulder. There is no acute bony injury that can be seen. The subacromial space is open.    PMFS History: Patient Active Problem List   Diagnosis Date Noted  . Diabetic neuropathy ([place]) 03/15/2015  . Diabetes mellitus type 2 with complications ([place]) 84/66/5993  . Hypertension   . Hyperlipidemia   . Anxiety   . Ulcerative colitis ([place])  Past Medical History:  Diagnosis Date  . Anxiety   . Diabetes mellitus without complication ([place])   . Hyperlipidemia   . Hypertension   . Ulcerative colitis ([place]) 05/05/2005    No family history on file.  No past surgical history on file. Social History   Occupational History  . Not on file.   Social History Main Topics  . Smoking status: Never Smoker  . Smokeless tobacco: Never Used  . Alcohol use No  . Drug use: No  . Sexual activity: Not on file

## 2016-06-12 ENCOUNTER — Ambulatory Visit: Payer: BLUE CROSS/BLUE SHIELD | Admitting: Physician Assistant

## 2016-06-24 ENCOUNTER — Ambulatory Visit (INDEPENDENT_AMBULATORY_CARE_PROVIDER_SITE_OTHER): Payer: BLUE CROSS/BLUE SHIELD | Admitting: Physician Assistant

## 2016-06-24 DIAGNOSIS — M25511 Pain in right shoulder: Secondary | ICD-10-CM

## 2016-06-24 DIAGNOSIS — G8929 Other chronic pain: Secondary | ICD-10-CM | POA: Diagnosis not present

## 2016-06-24 NOTE — Progress Notes (Signed)
   Office Visit Note   Patient: Kimberly Solis           Date of Birth: 09/12/1960           MRN: [id number] Visit Date: 06/24/2016              Requested by: Orlena Sheldon, PA-C [place], [place] [address] PCP: Karis Juba, PA-C   Assessment & Plan: Visit Diagnoses:  1. Chronic right shoulder pain     Plan: We will send her to physical therapy for range of motion of the right shoulder strengthening home exercise program and modalities. We'll see her back in a month check her progress. Just with her getting an MRI but she does not want any type of surgery at this point in time and therefore decided that the most beneficial for her to go to therapy to see if we can get her moving and pain under better control. Right shoulder pain is been ongoing for at least 6 months now and she states that she had no known injury outside of the nondisplaced humeral head fracture some years ago Follow-Up Instructions: Return in about 4 weeks (around 07/22/2016).   Orders:  No orders of the defined types were placed in this encounter.  No orders of the defined types were placed in this encounter.     Procedures: No procedures performed   Clinical Data: No additional findings.   Subjective: No chief complaint on file.   HPI Mrs. Strauch returns today follow-up of her right shoulder status post subacromial injection. She states the injection wasn't too helpful. Does have a history of a nondisplaced right humeral head fracture years back in was found 6 months later and it was distributed conservatively. Review of Systems   Objective: Vital Signs: There were no vitals taken for this visit.  Physical Exam  Ortho Exam Shoulder she has good strength with external/internal rotation against resistance. She has limited abduction and external rotation of the right shoulder and limited forward flexion only about coming to about 120. She's tender along her biceps body on the  right and up into the bicipital groove. The distal biceps tendons intact. Specialty Comments:  No specialty comments available.  Imaging: No results found.   PMFS History: Patient Active Problem List   Diagnosis Date Noted  . Diabetic neuropathy ([place]) 03/15/2015  . Diabetes mellitus type 2 with complications ([place]) 21/03/5519  . Hypertension   . Hyperlipidemia   . Anxiety   . Ulcerative colitis ([place])    Past Medical History:  Diagnosis Date  . Anxiety   . Diabetes mellitus without complication ([place])   . Hyperlipidemia   . Hypertension   . Ulcerative colitis ([place]) 05/05/2005    No family history on file.  No past surgical history on file. Social History   Occupational History  . Not on file.   Social History Main Topics  . Smoking status: Never Smoker  . Smokeless tobacco: Never Used  . Alcohol use No  . Drug use: No  . Sexual activity: Not on file

## 2016-06-26 ENCOUNTER — Ambulatory Visit: Payer: BLUE CROSS/BLUE SHIELD | Attending: Physician Assistant

## 2016-06-26 DIAGNOSIS — R293 Abnormal posture: Secondary | ICD-10-CM | POA: Diagnosis present

## 2016-06-26 DIAGNOSIS — M25611 Stiffness of right shoulder, not elsewhere classified: Secondary | ICD-10-CM | POA: Insufficient documentation

## 2016-06-26 DIAGNOSIS — M25511 Pain in right shoulder: Secondary | ICD-10-CM | POA: Insufficient documentation

## 2016-06-26 DIAGNOSIS — G8929 Other chronic pain: Secondary | ICD-10-CM | POA: Insufficient documentation

## 2016-06-26 NOTE — Therapy (Signed)
[place], [place], [address] Phone: [phone]   Fax:  [phone]  Physical Therapy Evaluation  Patient Details  Name: Kimberly Solis MRN: [id number] Date of Birth: 03-19-1961 Referring Provider: Jean Rosenthal  ,MD  Encounter Date: 06/26/2016      PT End of Session - 06/26/16 0748    Visit Number 1   Number of Visits 12   Date for PT Re-Evaluation 08/08/16   Authorization Type BCBS   PT Start Time 0757   PT Stop Time 0835   PT Time Calculation (min) 38 min   Activity Tolerance Patient tolerated treatment well   Behavior During Therapy [facility] for tasks assessed/performed      Past Medical History:  Diagnosis Date  . Anxiety   . Diabetes mellitus without complication ([place])   . Hyperlipidemia   . Hypertension   . Ulcerative colitis ([place]) 05/05/2005    History reviewed. No pertinent surgical history.  There were no vitals filed for this visit.       Subjective Assessment - 06/26/16 0804    Subjective She reports fall 2 yearss ago and caught self on RT arm and fractured humerous. Healed but now tih pain on going for 6 months. She lifts in ceramic shop.  MD thinks issues in muscle   Pertinent History LT shoulder surgery for bursitis and 2 small tears.    Limitations Lifting  can't reach to unhook bra, pull on objects an dreaching overhead.    How long can you sit comfortably? NA   How long can you stand comfortably? NA   How long can you walk comfortably? NA   Diagnostic tests xray: negative   Patient Stated Goals She wants to be able to reach behind back and reach and pull without pain.    Currently in Pain? Yes   Pain Score 2    Pain Location Shoulder   Pain Orientation Right   Pain Descriptors / Indicators Throbbing   Pain Type Chronic pain   Pain Radiating Towards to forearm   Pain Onset More than a month ago   Pain Frequency Constant   Aggravating Factors  reaching ,    Pain  Relieving Factors rest arm , aleve , heat/cold   Multiple Pain Sites No            [facility] PT Assessment - 06/26/16 0001      Assessment   Medical Diagnosis Chronic RT shoulder pain   Referring Provider Jean Rosenthal  ,MD   Onset Date/Surgical Date --  6 months ago   Hand Dominance Right   Next MD Visit 4 weeks   Prior Therapy no     Precautions   Precautions None     Restrictions   Weight Bearing Restrictions No     Balance Screen   Has the patient fallen in the past 6 months No     Prior Function   Level of Independence Independent     Cognition   Overall Cognitive Status Within Functional Limits for tasks assessed     Observation/Other Assessments   Focus on Therapeutic Outcomes (FOTO)  49% limited     Posture/Postural Control   Posture Comments rounded shoulders RT shoulder lower than LT , decreased scapula ROM     ROM / Strength   AROM / PROM / Strength AROM;Strength;PROM     AROM   AROM Assessment Site Shoulder   Right/Left Shoulder Right;Left   Right Shoulder  Extension 40 Degrees   Right Shoulder Flexion 125 Degrees   Right Shoulder ABduction 95 Degrees   Right Shoulder Internal Rotation 35 Degrees   Right Shoulder External Rotation 45 Degrees   Left Shoulder Extension 45 Degrees   Left Shoulder Flexion 135 Degrees   Left Shoulder ABduction 145 Degrees   Left Shoulder Internal Rotation 68 Degrees   Left Shoulder External Rotation 70 Degrees   Left Shoulder Horizontal ABduction 20 Degrees   Left Shoulder Horizontal ADduction 110 Degrees     PROM   PROM Assessment Site Shoulder   Right/Left Shoulder Right     Strength   Overall Strength Comments Normal strength both UE .       Grip RT  65,62,59      LT 50, 60, 60                   [facility] Adult PT Treatment/Exercise - 06/26/16 0001      Manual Therapy   Manual Therapy Taping   Kinesiotex Create Space     Kinesiotix   Create Space Y over anteior /post delt and 1 I over trap                  PT Education - 06/26/16 [id number]    Education provided Yes   Education Details POC , management of tape with removal at first signs of irritation  OK to shower with tape.  Adhesive capsulitis    Person(s) Educated Patient   Methods Explanation   Comprehension Verbalized understanding          PT Short Term Goals - 06/26/16 0801      PT SHORT TERM GOAL #1   Title She will be independent with initial HEP   Time 3   Period Weeks   Status New     PT SHORT TERM GOAL #2   Title She will report pain eases 25% generally during day   Time 3   Period Weeks   Status New     PT SHORT TERM GOAL #3   Title She wiill improve flexion to 120 degrees active   Time 3   Period Weeks   Status New     PT SHORT TERM GOAL #4   Title She will have active RT shoulder abduciton to 110 degrees   Time 3   Period Weeks   Status New           PT Long Term Goals - 06/26/16 0845      PT LONG TERM GOAL #1   Title She will have decreased pain so pain is intermittant   Time 6   Period Weeks   Status New     PT LONG TERM GOAL #2   Title She will be able to hook bra behind back with RT arm   Time 6   Period Weeks   Status New     PT LONG TERM GOAL #3   Title she will be able to pull on objects without pain   Time 6   Period Weeks   Status New     PT LONG TERM GOAL #4   Title she will be able to reach into overhead cabinets to access objects without pain.    Time 6   Period Weeks   Status New               Plan - 06/26/16 0759    Clinical Impression Statement Ms Kimberly Solis presents with  low complexity eval for chronic right shoulder pain. She has abnormal posture and stiffness with pain that limits activity with RT shoulder and UE   Rehab Potential Good   PT Frequency 2x / week   PT Duration 6 weeks   PT Treatment/Interventions Cryotherapy;Iontophoresis 48m/ml Dexamethasone;Moist Heat;Passive range of motion;Patient/family education;Taping;Dry  needling;Therapeutic activities;Therapeutic exercise;Manual techniques   PT Next Visit Plan Strengthening and stretching , modalities and manual for pain nad ROM   PT Home Exercise Plan Management of taping    Consulted and Agree with Plan of Care Patient      Patient will benefit from skilled therapeutic intervention in order to improve the following deficits and impairments:  Impaired UE functional use, Pain, Decreased strength, Decreased range of motion, Decreased activity tolerance  Visit Diagnosis: Chronic right shoulder pain - Plan: PT plan of care cert/re-cert  Stiffness of right shoulder, not elsewhere classified - Plan: PT plan of care cert/re-cert  Abnormal posture - Plan: PT plan of care cert/re-cert     Problem List Patient Active Problem List   Diagnosis Date Noted  . Diabetic neuropathy ([place] 03/15/2015  . Diabetes mellitus type 2 with complications ([place] 193/03/2161 . Hypertension   . Hyperlipidemia   . Anxiety   . Ulcerative colitis ([facility]     CDarrel Hoover PT 06/26/2016, 9:36 AM  [facility] [address]Phone: [phone]  Fax:  [phone] Name: Kimberly GATCHELMRN: [id number] of Birth: 417-Sep-1962

## 2016-07-01 ENCOUNTER — Ambulatory Visit: Payer: BLUE CROSS/BLUE SHIELD | Admitting: Physical Therapy

## 2016-07-01 DIAGNOSIS — G8929 Other chronic pain: Secondary | ICD-10-CM

## 2016-07-01 DIAGNOSIS — M25611 Stiffness of right shoulder, not elsewhere classified: Secondary | ICD-10-CM

## 2016-07-01 DIAGNOSIS — R293 Abnormal posture: Secondary | ICD-10-CM

## 2016-07-01 DIAGNOSIS — M25511 Pain in right shoulder: Secondary | ICD-10-CM | POA: Diagnosis not present

## 2016-07-01 NOTE — Patient Instructions (Signed)
Cane Exercise: Extension / Internal Rotation   Stand holding cane behind back with both hands palm-up. Slide cane up spine toward head. Hold __3__ seconds. Repeat _10-20___ times. Do __2__ sessions per day.     Kasandra Knudsen Exercise: Extension   Stand holding cane behind back with both hands palm-up. Lift the cane away from body. Hold __3__ seconds. Repeat __10-20__ times. Do _2___ sessions per day.  Cane Overhead - Supine  Hold cane at thighs with both hands, extend arms straight over head. Hold _5__ seconds. Repeat _10-20__ times. Do __2_ times per day.    Resistive Band Rowing   With resistive band anchored in door, grasp both ends. Keeping elbows bent, pull back, squeezing shoulder blades together. Hold __5__ seconds. Repeat __10-20__ times. Do _2___ sessions per day.

## 2016-07-01 NOTE — Therapy (Signed)
[place], [place], [address] Phone: [phone]   Fax:  [phone]  Physical Therapy Treatment  Patient Details  Name: Kimberly Solis MRN: [id number] Date of Birth: 1960-12-22 Referring Provider: Jean Rosenthal  ,MD  Encounter Date: 07/01/2016      PT End of Session - 07/01/16 0934    Visit Number 2   Number of Visits 12   Date for PT Re-Evaluation 08/08/16   Authorization Type BCBS   PT Start Time 0932   PT Stop Time 1025   PT Time Calculation (min) 53 min      Past Medical History:  Diagnosis Date  . Anxiety   . Diabetes mellitus without complication ([place])   . Hyperlipidemia   . Hypertension   . Ulcerative colitis ([place]) 05/05/2005    No past surgical history on file.  There were no vitals filed for this visit.      Subjective Assessment - 07/01/16 0933    Subjective it hurts all the time.    Currently in Pain? Yes   Pain Score 4    Pain Location Shoulder   Pain Orientation Right   Pain Descriptors / Indicators Throbbing   Pain Radiating Towards to forearm                         [facility] Adult PT Treatment/Exercise - 07/01/16 0001      Shoulder Exercises: Supine   Protraction 10 reps   Protraction Limitations with cane    Other Supine Exercises supine cane pullovers, horizontal abduction, ER x 20 each      Shoulder Exercises: Standing   Row 20 reps   Theraband Level (Shoulder Row) Level 2 (Red)     Shoulder Exercises: Pulleys   Flexion 2 minutes     Shoulder Exercises: ROM/Strengthening   Other ROM/Strengthening Exercises scap squeezes and shoulder rolls    Other ROM/Strengthening Exercises standing shoulder extension and IR with Cane      Modalities   Modalities Moist Heat     Moist Heat Therapy   Number Minutes Moist Heat 15 Minutes   Moist Heat Location Shoulder                PT Education - 07/01/16 1008    Education provided Yes    Education Details HEP   Person(s) Educated Patient   Methods Explanation;Handout   Comprehension Verbalized understanding          PT Short Term Goals - 06/26/16 0801      PT SHORT TERM GOAL #1   Title She will be independent with initial HEP   Time 3   Period Weeks   Status New     PT SHORT TERM GOAL #2   Title She will report pain eases 25% generally during day   Time 3   Period Weeks   Status New     PT SHORT TERM GOAL #3   Title She wiill improve flexion to 120 degrees active   Time 3   Period Weeks   Status New     PT SHORT TERM GOAL #4   Title She will have active RT shoulder abduciton to 110 degrees   Time 3   Period Weeks   Status New           PT Long Term Goals - 06/26/16 0845      PT LONG TERM GOAL #1   Title  She will have decreased pain so pain is intermittant   Time 6   Period Weeks   Status New     PT LONG TERM GOAL #2   Title She will be able to hook bra behind back with RT arm   Time 6   Period Weeks   Status New     PT LONG TERM GOAL #3   Title she will be able to pull on objects without pain   Time 6   Period Weeks   Status New     PT LONG TERM GOAL #4   Title she will be able to reach into overhead cabinets to access objects without pain.    Time 6   Period Weeks   Status New               Plan - 07/01/16 1009    Clinical Impression Statement Pt given HEP for ROM and posture. Most pain with IR. [place] post session.    PT Next Visit Plan review HEP, Strengthening and stretching , modalities and manual for pain nad ROM   PT Home Exercise Plan standing cane IR, extension, supine cane pullovers, rows    Consulted and Agree with Plan of Care Patient      Patient will benefit from skilled therapeutic intervention in order to improve the following deficits and impairments:  Impaired UE functional use, Pain, Decreased strength, Decreased range of motion, Decreased activity tolerance  Visit Diagnosis: Chronic right  shoulder pain  Stiffness of right shoulder, not elsewhere classified  Abnormal posture     Problem List Patient Active Problem List   Diagnosis Date Noted  . Diabetic neuropathy ([place]) 03/15/2015  . Diabetes mellitus type 2 with complications ([place]) 53/20/2334  . Hypertension   . Hyperlipidemia   . Anxiety   . Ulcerative colitis [facility])     Dorene Ar, [place] 07/01/2016, 10:22 AM  [facility] [address] [place], [place], [address] Phone: [phone]   Fax:  [phone]  Name: Kimberly Solis MRN: [id number] Date of Birth: 11/23/1960

## 2016-07-03 ENCOUNTER — Ambulatory Visit: Payer: BLUE CROSS/BLUE SHIELD | Attending: Physician Assistant | Admitting: Physical Therapy

## 2016-07-03 DIAGNOSIS — M25511 Pain in right shoulder: Secondary | ICD-10-CM | POA: Insufficient documentation

## 2016-07-03 DIAGNOSIS — M25611 Stiffness of right shoulder, not elsewhere classified: Secondary | ICD-10-CM | POA: Diagnosis present

## 2016-07-03 DIAGNOSIS — R293 Abnormal posture: Secondary | ICD-10-CM | POA: Insufficient documentation

## 2016-07-03 DIAGNOSIS — G8929 Other chronic pain: Secondary | ICD-10-CM | POA: Diagnosis present

## 2016-07-03 NOTE — Patient Instructions (Signed)
Remove tape if irritating 

## 2016-07-03 NOTE — Therapy (Signed)
[place], [place], [address] Phone: [phone]   Fax:  [phone]  Physical Therapy Treatment  Patient Details  Name: Kimberly Solis MRN: [id number] Date of Birth: Sep 05, 1960 Referring Provider: Jean Rosenthal  ,MD  Encounter Date: 07/03/2016      PT End of Session - 07/03/16 0904    Visit Number 3   Number of Visits 12   Date for PT Re-Evaluation 08/08/16   PT Start Time 0802   PT Stop Time 0910   PT Time Calculation (min) 68 min   Activity Tolerance Patient tolerated treatment well   Behavior During Therapy [facility] for tasks assessed/performed      Past Medical History:  Diagnosis Date  . Anxiety   . Diabetes mellitus without complication ([place])   . Hyperlipidemia   . Hypertension   . Ulcerative colitis ([place]) 05/05/2005    No past surgical history on file.  There were no vitals filed for this visit.      Subjective Assessment - 07/03/16 0806    Subjective It hurts more than it doesn't.     Currently in Pain? Yes   Pain Score 3    Pain Location Shoulder   Pain Orientation Right   Pain Descriptors / Indicators Aching;Throbbing   Pain Radiating Towards to forearm,  shoulder girdle   Pain Frequency Constant   Aggravating Factors  lifting,  end of day,  pulling her mother up. Reaching behind.   Pain Relieving Factors rest, aleve,  heat . cold   Effect of Pain on Daily Activities unable to sleep on right side,  unable to hook bra,  reaching behind hard.                          [place] Adult PT Treatment/Exercise - 07/03/16 0001      Shoulder Exercises: Supine   External Rotation 10 reps   Theraband Level (Shoulder External Rotation) Level 1 (Yellow)   Internal Rotation Limitations Cues to drop shoulders   Other Supine Exercises extension,  yellow band 10 X ,, cues     Shoulder Exercises: Pulleys   Flexion 3 minutes     Moist Heat Therapy   Number Minutes Moist  Heat 15 Minutes   Moist Heat Location Shoulder;Cervical     Manual Therapy   Manual Therapy Soft tissue mobilization;Passive ROM;Taping   Soft tissue mobilization shoulder girdle,  lymph system vacume activation,     Kinesiotex Inhibit Muscle     Kinesiotix   Create Space 3 Y's    Inhibit Muscle  deltoid , Supraspin                PT Education - 07/03/16 0904    Education provided Yes   Education Details anatomy, posture   Person(s) Educated Patient   Methods Explanation;Demonstration   Comprehension Verbalized understanding          PT Short Term Goals - 07/03/16 0907      PT SHORT TERM GOAL #1   Title She will be independent with initial HEP   Time 3   Period Weeks   Status Unable to assess     PT SHORT TERM GOAL #2   Title She will report pain eases 25% generally during day   Baseline not yet changed.  pain correlates to activity   Time 3   Period Weeks   Status On-going     PT SHORT  TERM GOAL #3   Title She wiill improve flexion to 120 degrees active   Time 3   Period Weeks   Status Unable to assess     PT SHORT TERM GOAL #4   Title She will have active RT shoulder abduciton to 110 degrees   Time 3   Period Weeks   Status Unable to assess           PT Long Term Goals - 06/26/16 0845      PT LONG TERM GOAL #1   Title She will have decreased pain so pain is intermittant   Time 6   Period Weeks   Status New     PT LONG TERM GOAL #2   Title She will be able to hook bra behind back with RT arm   Time 6   Period Weeks   Status New     PT LONG TERM GOAL #3   Title she will be able to pull on objects without pain   Time 6   Period Weeks   Status New     PT LONG TERM GOAL #4   Title she will be able to reach into overhead cabinets to access objects without pain.    Time 6   Period Weeks   Status New               Plan - 07/03/16 0905    Clinical Impression Statement Patient is doing her HEP.  She has less pain at rest  post manual/heat.  Patient is able to improve sitting posture with cues.  No new goals met.   PT Next Visit Plan preogrss HEP.  Assess tape.  Measure and check goals.     PT Home Exercise Plan standing cane IR, extension, supine cane pullovers, rows    Consulted and Agree with Plan of Care Patient      Patient will benefit from skilled therapeutic intervention in order to improve the following deficits and impairments:  Impaired UE functional use, Pain, Decreased strength, Decreased range of motion, Decreased activity tolerance  Visit Diagnosis: Chronic right shoulder pain  Stiffness of right shoulder, not elsewhere classified  Abnormal posture     Problem List Patient Active Problem List   Diagnosis Date Noted  . Diabetic neuropathy ([place]) 03/15/2015  . Diabetes mellitus type 2 with complications ([place]) 32/91/9166  . Hypertension   . Hyperlipidemia   . Anxiety   . Ulcerative colitis ([place])     HARRIS,KAREN  PTA 07/03/2016, 9:08 AM  [facility] [address] [place], [place], [address] Phone: [phone]   Fax:  [phone]  Name: Kimberly Solis MRN: [id number] Date of Birth: April 12, 1961

## 2016-07-08 ENCOUNTER — Ambulatory Visit: Payer: BLUE CROSS/BLUE SHIELD | Admitting: Physical Therapy

## 2016-07-08 DIAGNOSIS — G8929 Other chronic pain: Secondary | ICD-10-CM

## 2016-07-08 DIAGNOSIS — M25511 Pain in right shoulder: Secondary | ICD-10-CM | POA: Diagnosis not present

## 2016-07-08 DIAGNOSIS — R293 Abnormal posture: Secondary | ICD-10-CM

## 2016-07-08 DIAGNOSIS — M25611 Stiffness of right shoulder, not elsewhere classified: Secondary | ICD-10-CM

## 2016-07-08 NOTE — Therapy (Signed)
[place], [place], [address] Phone: [phone]   Fax:  [phone]  Physical Therapy Treatment  Patient Details  Name: Kimberly Solis MRN: [id number] Date of Birth: Oct 11, 1960 Referring Provider: Jean Rosenthal  ,MD  Encounter Date: 07/08/2016      PT End of Session - 07/08/16 0853    Visit Number 4   Number of Visits 12   Date for PT Re-Evaluation 08/08/16   PT Start Time 0803   PT Stop Time 0905   PT Time Calculation (min) 62 min   Activity Tolerance Patient tolerated treatment well   Behavior During Therapy [facility] for tasks assessed/performed      Past Medical History:  Diagnosis Date  . Anxiety   . Diabetes mellitus without complication ([place])   . Hyperlipidemia   . Hypertension   . Ulcerative colitis ([place]) 05/05/2005    No past surgical history on file.  There were no vitals filed for this visit.      Subjective Assessment - 07/08/16 0807    Subjective 2/10  .I have been doing the exercises.  Worse at the end if the day.  Reaching behind is still hard   Currently in Pain? Yes   Pain Score 4   2/10 now   Pain Descriptors / Indicators Aching;Constant   Pain Type Chronic pain   Pain Frequency Constant   Aggravating Factors  end of day,  Reaching,  pulling her Mother up. Lifting items from [employer]   Pain Relieving Factors rest ,  heat,  cold   Effect of Pain on Daily Activities sleeping a little better.  reaaching behind            [facility] PT Assessment - 07/08/16 0001      AROM   Right Shoulder Flexion 120 Degrees                     [facility] Adult PT Treatment/Exercise - 07/08/16 0001      Shoulder Exercises: Supine   Other Supine Exercises Decompression,  extra pillows needed,  for posture correction,  HEP  5 x 5 seconds each,   decompression position 5 minutes.  Rt LE needed 3 pillows     Shoulder Exercises: Standing   Other Standing Exercises self Mobs with  towel roll.  HEP  ROM increased.      Shoulder Exercises: ROM/Strengthening   Other ROM/Strengthening Exercises pilates sitting and supine rows and extension ,, yellow springs,  red/ yellow hooks sitting,,, supine red, yellow and bluein sets of 10,  intermittant cues.     Moist Heat Therapy   Number Minutes Moist Heat 15 Minutes   Moist Heat Location Shoulder;Lumbar Spine                PT Education - 07/08/16 0851    Education provided Yes   Education Details HEP   Person(s) Educated Patient   Methods Explanation;Demonstration;Tactile cues;Verbal cues;Handout   Comprehension Verbalized understanding;Returned demonstration          PT Short Term Goals - 07/08/16 0855      PT SHORT TERM GOAL #1   Title She will be independent with initial HEP   Baseline independent with exercises issued so far   Time 3     PT SHORT TERM GOAL #2   Title She will report pain eases 25% generally during day   Baseline Less pain ,  % not given, pain correlates to  activity   Time 3   Period Weeks   Status On-going     PT SHORT TERM GOAL #3   Title She wiill improve flexion to 120 degrees active   Baseline 120   Time 3   Period Weeks   Status Achieved     PT SHORT TERM GOAL #4   Time 3   Period Weeks   Status Unable to assess           PT Long Term Goals - 06/26/16 0845      PT LONG TERM GOAL #1   Title She will have decreased pain so pain is intermittant   Time 6   Period Weeks   Status New     PT LONG TERM GOAL #2   Title She will be able to hook bra behind back with RT arm   Time 6   Period Weeks   Status New     PT LONG TERM GOAL #3   Title she will be able to pull on objects without pain   Time 6   Period Weeks   Status New     PT LONG TERM GOAL #4   Title she will be able to reach into overhead cabinets to access objects without pain.    Time 6   Period Weeks   Status New               Plan - 07/08/16 2025    Clinical Impression Statement  Pain improving however it is still constant.  She is adherent with her HEP.  Progress toward her HEP.  Shoulder extension improved 6 inches after exercise.     PT Next Visit Plan review decompression and self mobs.  Measure,     PT Home Exercise Plan standing cane IR, extension, supine cane pullovers, rows decompression,  Self mobs   Consulted and Agree with Plan of Care Patient      Patient will benefit from skilled therapeutic intervention in order to improve the following deficits and impairments:  Impaired UE functional use, Pain, Decreased strength, Decreased range of motion, Decreased activity tolerance  Visit Diagnosis: Chronic right shoulder pain  Stiffness of right shoulder, not elsewhere classified  Abnormal posture     Problem List Patient Active Problem List   Diagnosis Date Noted  . Diabetic neuropathy ([place]) 03/15/2015  . Diabetes mellitus type 2 with complications ([place]) 42/70/6237  . Hypertension   . Hyperlipidemia   . Anxiety   . Ulcerative colitis ([place])     Yazmen Briones  PTA 07/08/2016, 9:02 AM  [facility] [address] [place], [place], [address] Phone: [phone]   Fax:  [phone]  Name: KYNSIE FALKNER MRN: [id number] Date of Birth: [birth year]

## 2016-07-08 NOTE — Patient Instructions (Signed)
Decompression exercises added from  Exercise drawer.  All issued daily 5 X 5   Daily Self Mobilization  Hand drawn for shoulder IR.  1-3 x a day 10 X 10 seconds

## 2016-07-10 ENCOUNTER — Ambulatory Visit: Payer: BLUE CROSS/BLUE SHIELD | Admitting: Physical Therapy

## 2016-07-10 DIAGNOSIS — M25511 Pain in right shoulder: Secondary | ICD-10-CM | POA: Diagnosis not present

## 2016-07-10 DIAGNOSIS — G8929 Other chronic pain: Secondary | ICD-10-CM

## 2016-07-10 DIAGNOSIS — M25611 Stiffness of right shoulder, not elsewhere classified: Secondary | ICD-10-CM

## 2016-07-10 DIAGNOSIS — R293 Abnormal posture: Secondary | ICD-10-CM

## 2016-07-10 NOTE — Patient Instructions (Signed)

## 2016-07-10 NOTE — Therapy (Signed)
[place], [place], [address] Phone: [phone]   Fax:  [phone]  Physical Therapy Treatment  Patient Details  Name: Kimberly Solis MRN: [id number] Date of Birth: 09/19/1960 Referring Provider: Jean Rosenthal  ,MD  Encounter Date: 07/10/2016      PT End of Session - 07/10/16 0954    Visit Number 5   Number of Visits 12   Date for PT Re-Evaluation 08/08/16   PT Start Time 0803   PT Stop Time 0900   PT Time Calculation (min) 57 min   Activity Tolerance Patient tolerated treatment well   Behavior During Therapy [facility] for tasks assessed/performed      Past Medical History:  Diagnosis Date  . Anxiety   . Diabetes mellitus without complication ([place])   . Hyperlipidemia   . Hypertension   . Ulcerative colitis ([place]) 05/05/2005    No past surgical history on file.  There were no vitals filed for this visit.      Subjective Assessment - 07/10/16 0808    Subjective She is doing her exercises with her Mother.      She was sore after the last session most of the day yesterday.   2/10.  yesterday a 4/10.  in joint line and into deltoid.    Currently in Pain? Yes   Pain Score 2    Pain Location Shoulder            [facility] PT Assessment - 07/10/16 0001      AROM   Right Shoulder Extension 43 Degrees   Right Shoulder Flexion 120 Degrees   Right Shoulder ABduction 104 Degrees  3/10  into upper forearm   Right Shoulder External Rotation 65 Degrees                     [facility] Adult PT Treatment/Exercise - 07/10/16 0001      Shoulder Exercises: Supine   External Rotation AROM;5 reps   Internal Rotation 5 reps;AROM   ABduction 5 reps;AROM     Shoulder Exercises: Pulleys   Flexion 3 minutes     Modalities   Modalities Moist Heat;Iontophoresis     Moist Heat Therapy   Number Minutes Moist Heat 15 Minutes   Moist Heat Location Shoulder     Iontophoresis   Type of  Iontophoresis Dexamethasone   Location shoulder,  anterior   Dose 1cc,  4 mg/ml   Time 8     Manual Therapy   Manual Therapy Soft tissue mobilization;Passive ROM   Manual therapy comments biceps congester and tender distal elbow to shoulder   Soft tissue mobilization A/P glides,  gentle distraction,    Passive ROM gentle right shoulder                PT Education - 07/10/16 0954    Education provided Yes   Education Details ionto information   Person(s) Educated Patient   Methods Explanation;Handout   Comprehension Verbalized understanding          PT Short Term Goals - 07/08/16 0855      PT SHORT TERM GOAL #1   Title She will be independent with initial HEP   Baseline independent with exercises issued so far   Time 3     PT SHORT TERM GOAL #2   Title She will report pain eases 25% generally during day   Baseline Less pain ,  % not given, pain correlates to activity  Time 3   Period Weeks   Status On-going     PT SHORT TERM GOAL #3   Title She wiill improve flexion to 120 degrees active   Baseline 120   Time 3   Period Weeks   Status Achieved     PT SHORT TERM GOAL #4   Time 3   Period Weeks   Status Unable to assess           PT Long Term Goals - 06/26/16 0845      PT LONG TERM GOAL #1   Title She will have decreased pain so pain is intermittant   Time 6   Period Weeks   Status New     PT LONG TERM GOAL #2   Title She will be able to hook bra behind back with RT arm   Time 6   Period Weeks   Status New     PT LONG TERM GOAL #3   Title she will be able to pull on objects without pain   Time 6   Period Weeks   Status New     PT LONG TERM GOAL #4   Title she will be able to reach into overhead cabinets to access objects without pain.    Time 6   Period Weeks   Status New               Plan - 07/10/16 0569    Clinical Impression Statement ROM improving 120 flexion,  Abduction 104,  External rotation 65.  Trial of ionto  today.    Progress toward ROM goals.  Pain and motion improving.   PT Next Visit Plan review decompression if needed.  assess ionto and continue.  work toward specific goals.     PT Home Exercise Plan standing cane IR, extension, supine cane pullovers, rows decompression,  Self mobs   Consulted and Agree with Plan of Care Patient      Patient will benefit from skilled therapeutic intervention in order to improve the following deficits and impairments:  Impaired UE functional use, Pain, Decreased strength, Decreased range of motion, Decreased activity tolerance  Visit Diagnosis: Chronic right shoulder pain  Stiffness of right shoulder, not elsewhere classified  Abnormal posture     Problem List Patient Active Problem List   Diagnosis Date Noted  . Diabetic neuropathy ([place]) 03/15/2015  . Diabetes mellitus type 2 with complications ([place]) 79/48/0165  . Hypertension   . Hyperlipidemia   . Anxiety   . Ulcerative colitis ([place])     Starlit Raburn PTA 07/10/2016, 11:27 AM  [facility] [address] [place], [place], [address] Phone: [phone]   Fax:  [phone]  Name: Kimberly Solis MRN: [id number] Date of Birth: July 17, 1960

## 2016-07-15 ENCOUNTER — Ambulatory Visit: Payer: BLUE CROSS/BLUE SHIELD | Admitting: Physical Therapy

## 2016-07-17 ENCOUNTER — Encounter: Payer: Self-pay | Admitting: Physical Therapy

## 2016-07-17 ENCOUNTER — Ambulatory Visit: Payer: BLUE CROSS/BLUE SHIELD | Admitting: Physical Therapy

## 2016-07-17 DIAGNOSIS — M25511 Pain in right shoulder: Secondary | ICD-10-CM | POA: Diagnosis not present

## 2016-07-17 DIAGNOSIS — G8929 Other chronic pain: Secondary | ICD-10-CM

## 2016-07-17 DIAGNOSIS — M25611 Stiffness of right shoulder, not elsewhere classified: Secondary | ICD-10-CM

## 2016-07-17 DIAGNOSIS — R293 Abnormal posture: Secondary | ICD-10-CM

## 2016-07-17 NOTE — Therapy (Signed)
[place], [place], [address] Phone: [phone]   Fax:  [phone]  Physical Therapy Treatment  Patient Details  Name: Kimberly Solis MRN: [id number] Date of Birth: 1960-06-12 Referring Provider: Jean Rosenthal  ,MD  Encounter Date: 07/17/2016      PT End of Session - 07/17/16 1729    Visit Number 6   Number of Visits 12   Date for PT Re-Evaluation 08/08/16   PT Start Time 0804   PT Stop Time 0900   PT Time Calculation (min) 56 min   Activity Tolerance Patient tolerated treatment well   Behavior During Therapy [facility] for tasks assessed/performed      Past Medical History:  Diagnosis Date  . Anxiety   . Diabetes mellitus without complication ([place])   . Hyperlipidemia   . Hypertension   . Ulcerative colitis ([place]) 05/05/2005    History reviewed. No pertinent surgical history.  There were no vitals filed for this visit.      Subjective Assessment - 07/17/16 0810    Subjective She has an elbow splint.  She wears when she gets home and doing housework.    Have been doing my home exercises.  Reaching behind is better.  My posture is better.   Currently in Pain? Yes   Pain Location Shoulder   Pain Orientation Right   Pain Descriptors / Indicators Aching;Constant  torn, pinches   Pain Radiating Towards to 3 inches above wrist   Aggravating Factors  shopping , cleaning, mopping day.  Using it hurts it worse    Pain Relieving Factors rest, ace, heat cold   Effect of Pain on Daily Activities ADL's limiting                         [facility] Adult PT Treatment/Exercise - 07/17/16 0001      Shoulder Exercises: Standing   Other Standing Exercises self mobs with towel roll     Shoulder Exercises: Stretch   Corner Stretch Limitations door way stretch  3 x 30      Moist Heat Therapy   Number Minutes Moist Heat 15 Minutes   Moist Heat Location Shoulder     Iontophoresis   Type of  Iontophoresis Dexamethasone   Location shoulder,  anterior   Dose 1cc,  4 mg/ml   Time 8     Manual Therapy   Manual Therapy Soft tissue mobilization   Manual therapy comments upper traps shoulder girdle,  able to reproduce                PT Education - 07/17/16 1721    Education provided Yes   Education Details HEP   Person(s) Educated Patient   Methods Explanation;Demonstration   Comprehension Verbalized understanding;Returned demonstration          PT Short Term Goals - 07/08/16 0855      PT SHORT TERM GOAL #1   Title She will be independent with initial HEP   Baseline independent with exercises issued so far   Time 3     PT SHORT TERM GOAL #2   Title She will report pain eases 25% generally during day   Baseline Less pain ,  % not given, pain correlates to activity   Time 3   Period Weeks   Status On-going     PT SHORT TERM GOAL #3   Title She wiill improve flexion to 120 degrees active  Baseline 120   Time 3   Period Weeks   Status Achieved     PT SHORT TERM GOAL #4   Time 3   Period Weeks   Status Unable to assess           PT Long Term Goals - 06/26/16 0845      PT LONG TERM GOAL #1   Title She will have decreased pain so pain is intermittant   Time 6   Period Weeks   Status New     PT LONG TERM GOAL #2   Title She will be able to hook bra behind back with RT arm   Time 6   Period Weeks   Status New     PT LONG TERM GOAL #3   Title she will be able to pull on objects without pain   Time 6   Period Weeks   Status New     PT LONG TERM GOAL #4   Title she will be able to reach into overhead cabinets to access objects without pain.    Time 6   Period Weeks   Status New               Plan - 07/17/16 1731    Clinical Impression Statement Able to reproduce pain to wrist with soft tissue work.  1st rib may be involved.  Ionto continued .   Patient had 4 inches increased reach.  able to reach 1 inch below bra line.  session.   PT Next Visit Plan review decompression if needed.  assess ionto and continue.  work toward specific goals.     PT Home Exercise Plan standing cane IR, extension, supine cane pullovers, rows decompression,  Self mobs,  door way stretch   Consulted and Agree with Plan of Care Patient      Patient will benefit from skilled therapeutic intervention in order to improve the following deficits and impairments:  Impaired UE functional use, Pain, Decreased strength, Decreased range of motion, Decreased activity tolerance  Visit Diagnosis: Chronic right shoulder pain  Stiffness of right shoulder, not elsewhere classified  Abnormal posture     Problem List Patient Active Problem List   Diagnosis Date Noted  . Diabetic neuropathy ([place]) 03/15/2015  . Diabetes mellitus type 2 with complications ([place]) 22/29/7989  . Hypertension   . Hyperlipidemia   . Anxiety   . Ulcerative colitis ([place])     HARRIS,KAREN PTA 07/17/2016, 5:35 PM  [facility] [address] [place], [place], [address] Phone: [phone]   Fax:  [phone]  Name: Kimberly Solis MRN: [id number] Date of Birth: [birth year]

## 2016-07-21 ENCOUNTER — Ambulatory Visit (INDEPENDENT_AMBULATORY_CARE_PROVIDER_SITE_OTHER): Payer: BLUE CROSS/BLUE SHIELD | Admitting: Physician Assistant

## 2016-07-22 ENCOUNTER — Ambulatory Visit: Payer: BLUE CROSS/BLUE SHIELD | Admitting: Physical Therapy

## 2016-07-22 ENCOUNTER — Encounter: Payer: Self-pay | Admitting: Physical Therapy

## 2016-07-22 DIAGNOSIS — G8929 Other chronic pain: Secondary | ICD-10-CM

## 2016-07-22 DIAGNOSIS — R293 Abnormal posture: Secondary | ICD-10-CM

## 2016-07-22 DIAGNOSIS — M25611 Stiffness of right shoulder, not elsewhere classified: Secondary | ICD-10-CM

## 2016-07-22 DIAGNOSIS — M25511 Pain in right shoulder: Secondary | ICD-10-CM | POA: Diagnosis not present

## 2016-07-22 NOTE — Therapy (Signed)
[place], [place], [address] Phone: [phone]   Fax:  [phone]  Physical Therapy Treatment  Patient Details  Name: Kimberly Solis MRN: [id number] Date of Birth: [birth year] Referring Provider: Jean Rosenthal  ,MD  Encounter Date: 07/22/2016      PT End of Session - 07/22/16 1048    Visit Number 7   Number of Visits 12   Date for PT Re-Evaluation 08/08/16   PT Start Time 0805   PT Stop Time 0903   PT Time Calculation (min) 58 min   Activity Tolerance Patient tolerated treatment well   Behavior During Therapy [facility] for tasks assessed/performed      Past Medical History:  Diagnosis Date  . Anxiety   . Diabetes mellitus without complication ([place])   . Hyperlipidemia   . Hypertension   . Ulcerative colitis ([place]) 05/05/2005    History reviewed. No pertinent surgical history.  There were no vitals filed for this visit.      Subjective Assessment - 07/22/16 0806    Subjective Pain 3-4/10 .  The stretches have helped the most,   Currently in Pain? Yes   Pain Score 4    Pain Location Shoulder   Pain Orientation Right   Pain Descriptors / Indicators Aching;Constant   Pain Type Chronic pain   Pain Radiating Towards to wrist  If she uses it a lot   Pain Frequency Constant   Aggravating Factors  using arm a lot   Pain Relieving Factors resting, heat >cold              [facility] PT Assessment - 07/22/16 0001      AROM   Right Shoulder Flexion 136 Degrees   Right Shoulder ABduction 128 Degrees                     [facility] Adult PT Treatment/Exercise - 07/22/16 0001      Shoulder Exercises: Supine   Other Supine Exercises UE ranger 3 directions  15-20 X     Shoulder Exercises: Standing   Row 20 reps   Theraband Level (Shoulder Row) Level 2 (Red)  not ready to progress to grees,  heavy cues     Shoulder Exercises: Pulleys   Flexion 3 minutes   ABduction 3 minutes     Shoulder Exercises: Stretch   Corner Stretch Limitations Single arm door way stretch,  HEP     Moist Heat Therapy   Number Minutes Moist Heat 15 Minutes   Moist Heat Location Shoulder     Iontophoresis   Type of Iontophoresis Dexamethasone   Location shoulder,  anterior   Dose 1cc, 56m/Ml   Time 8     Manual Therapy   Manual Therapy Soft tissue mobilization   Manual therapy comments upper traps shoulder girdle,  able to reproduce  teres minor reproduces symptoms,  tissue softened                PT Education - 07/22/16 1047    Education provided Yes   Education Details HEP   Person(s) Educated Patient   Methods Explanation   Comprehension Verbalized understanding          PT Short Term Goals - 07/22/16 0814      PT SHORT TERM GOAL #1   Title She will be independent with initial HEP   Baseline independent   Time 3   Period Weeks   Status Achieved  PT SHORT TERM GOAL #2   Title She will report pain eases 25% generally during day   Baseline 20 %   Time 3   Period Weeks   Status On-going     PT SHORT TERM GOAL #3   Title She wiill improve flexion to 120 degrees active   Time 3   Period Weeks   Status Achieved     PT SHORT TERM GOAL #4   Title She will have active RT shoulder abduciton to 110 degrees   Baseline with some scaption 128   Time 3   Period Weeks   Status Partially Met           PT Long Term Goals - 06/26/16 0845      PT LONG TERM GOAL #1   Title She will have decreased pain so pain is intermittant   Time 6   Period Weeks   Status New     PT LONG TERM GOAL #2   Title She will be able to hook bra behind back with RT arm   Time 6   Period Weeks   Status New     PT LONG TERM GOAL #3   Title she will be able to pull on objects without pain   Time 6   Period Weeks   Status New     PT LONG TERM GOAL #4   Title she will be able to reach into overhead cabinets to access objects without pain.    Time 6   Period Weeks    Status New               Plan - 07/22/16 1049    Clinical Impression Statement STG #1 met, #2 part met. #3 met, #4 partially met.  Shoulder AROM continues to improve see flow sheet.  Less pain notee ay the end of the session.  Ionto continued # 3 today.   PT Next Visit Plan progress HEP when, continue mnual    PT Home Exercise Plan standing cane IR, extension, supine cane pullovers, rows decompression,  Self mobs,  door way stretch   Consulted and Agree with Plan of Care Patient      Patient will benefit from skilled therapeutic intervention in order to improve the following deficits and impairments:  Impaired UE functional use, Pain, Decreased strength, Decreased range of motion, Decreased activity tolerance  Visit Diagnosis: Chronic right shoulder pain  Stiffness of right shoulder, not elsewhere classified  Abnormal posture     Problem List Patient Active Problem List   Diagnosis Date Noted  . Diabetic neuropathy ([place]) 03/15/2015  . Diabetes mellitus type 2 with complications ([place]) 21/30/8657  . Hypertension   . Hyperlipidemia   . Anxiety   . Ulcerative colitis ([place])     HARRIS,KAREN PTA 07/22/2016, 10:54 AM  [facility] [address] [place], [place], [address] Phone: [phone]   Fax:  [phone]  Name: Kimberly Solis MRN: [id number] Date of Birth: 05-31-1960

## 2016-07-22 NOTE — Patient Instructions (Signed)
Issued from exercise drawer single arm door stretch into ER. 3 X 30 seconds 1-2 X a day

## 2016-07-24 ENCOUNTER — Ambulatory Visit: Payer: BLUE CROSS/BLUE SHIELD

## 2016-07-28 ENCOUNTER — Ambulatory Visit: Payer: BLUE CROSS/BLUE SHIELD | Admitting: Physical Therapy

## 2016-07-28 ENCOUNTER — Encounter: Payer: Self-pay | Admitting: Physical Therapy

## 2016-07-28 DIAGNOSIS — M25611 Stiffness of right shoulder, not elsewhere classified: Secondary | ICD-10-CM

## 2016-07-28 DIAGNOSIS — R293 Abnormal posture: Secondary | ICD-10-CM

## 2016-07-28 DIAGNOSIS — M25511 Pain in right shoulder: Principal | ICD-10-CM

## 2016-07-28 DIAGNOSIS — G8929 Other chronic pain: Secondary | ICD-10-CM

## 2016-07-28 NOTE — Therapy (Signed)
[place], [place], [address] Phone: [phone]   Fax:  [phone]  Physical Therapy Treatment  Patient Details  Name: Kimberly Solis MRN: [id number] Date of Birth: June 24, 1960 Referring Provider: Jean Rosenthal  ,MD  Encounter Date: 07/28/2016      PT End of Session - 07/28/16 1823    Visit Number 8   Number of Visits 12   Date for PT Re-Evaluation 08/08/16   PT Start Time 0847   PT Stop Time 0945   PT Time Calculation (min) 58 min   Activity Tolerance Patient tolerated treatment well   Behavior During Therapy [facility] for tasks assessed/performed      Past Medical History:  Diagnosis Date  . Anxiety   . Diabetes mellitus without complication ([place])   . Hyperlipidemia   . Hypertension   . Ulcerative colitis ([place]) 05/05/2005    History reviewed. No pertinent surgical history.  There were no vitals filed for this visit.      Subjective Assessment - 07/28/16 0849    Subjective Had heavy lifting and activity over the weekend, It eased Sunday and eased even more after she did exercises.  Exercises did not help Friday.  She did not try exercises  Sat Tennis ball are helpful.   Currently in Pain? Yes   Pain Score 2   Up to 6/10   Pain Location Shoulder   Pain Orientation Right   Pain Descriptors / Indicators Throbbing   Pain Radiating Towards to wrist,  this morning is in my elbow   Aggravating Factors  using arm a lot     Pain Relieving Factors Zanex,  tennis balls   Effect of Pain on Daily Activities LDL's limited   Multiple Pain Sites No            [facility] PT Assessment - 07/28/16 0001      AROM   Right Shoulder ABduction 75 Degrees  if no scaption allowed.  with scaption 110                     [facility] Adult PT Treatment/Exercise - 07/28/16 0001      Self-Care   Self-Care ADL's   ADL's Practiced vacume  Patient able to modift as needed to get lower.  She is using  some correct techniques already.      Shoulder Exercises: Supine   External Rotation 10 reps   Theraband Level (Shoulder External Rotation) Level 1 (Yellow)  Both,  towel roll and 3 inch bolster under elbow.    Flexion 10 reps   Flexion Limitations Cane, limited by cane even after cues,  No stretches felt.  Will D/ C thos for now   Other Supine Exercises From flexion to hip with straight elbow.  yellow band 10 X 3 sets without pain increase     Shoulder Exercises: Standing   Internal Rotation Limitations Cane cues needed,  pain shoulder right.    Extension 5 reps   Extension Limitations cues for technique,  squeeze  Place cane on hold not helping     Shoulder Exercises: Pulleys   Flexion --  5 minutes     Shoulder Exercises: Isometric Strengthening   Extension Limitations 10 X 5 seconds  These feel good.    External Rotation --  10 X 5 seconds with rolled towel , supine    External Rotation Limitations No pain with any isometrics except for "Burn" with abduction isometrics.  Internal Rotation Limitations 10 x 5 with rolled towel supine   ADduction --  10 x 5 seconds, supine,  Felt the Burn wit presses only     Moist Heat Therapy   Number Minutes Moist Heat 15 Minutes   Moist Heat Location Shoulder     Iontophoresis   Type of Iontophoresis Dexamethasone   Location shoulder , anterior   Dose 1cc, 4 mg/ml   Time 8     Manual Therapy   Manual Therapy Soft tissue mobilization   Manual therapy comments upper traps shoulder girdle,  able to reproduce  teres minor reproduces symptoms,  tissue softened   Soft tissue mobilization A/P glides inferior and posterior glides   grade 2 gith gentle distraction.   Passive ROM passive ROM scapular mobs                PT Education - 07/28/16 0855    Education provided Yes   Education Details ADL handout   Person(s) Educated Patient   Methods Explanation;Demonstration;Verbal cues;Handout   Comprehension Verbalized  understanding          PT Short Term Goals - 07/22/16 4098      PT SHORT TERM GOAL #1   Title She will be independent with initial HEP   Baseline independent   Time 3   Period Weeks   Status Achieved     PT SHORT TERM GOAL #2   Title She will report pain eases 25% generally during day   Baseline 20 %   Time 3   Period Weeks   Status On-going     PT SHORT TERM GOAL #3   Title She wiill improve flexion to 120 degrees active   Time 3   Period Weeks   Status Achieved     PT SHORT TERM GOAL #4   Title She will have active RT shoulder abduciton to 110 degrees   Baseline with some scaption 128   Time 3   Period Weeks   Status Partially Met           PT Long Term Goals - 06/26/16 0845      PT LONG TERM GOAL #1   Title She will have decreased pain so pain is intermittant   Time 6   Period Weeks   Status New     PT LONG TERM GOAL #2   Title She will be able to hook bra behind back with RT arm   Time 6   Period Weeks   Status New     PT LONG TERM GOAL #3   Title she will be able to pull on objects without pain   Time 6   Period Weeks   Status New     PT LONG TERM GOAL #4   Title she will be able to reach into overhead cabinets to access objects without pain.    Time 6   Period Weeks   Status New               Plan - 07/28/16 1823    Clinical Impression Statement ROM, Manual  and pain free exercise are focus of session.  I[place] continued.  Patient is more functional however she has arm pain return moderately high at times. No new goals met,   PT Next Visit Plan progress HEP when, continue mnual    PT Home Exercise Plan standing cane IR, extension, supine cane pullovers, rows decompression,  Self mobs,  door way stretch  Consulted and Agree with Plan of Care Patient      Patient will benefit from skilled therapeutic intervention in order to improve the following deficits and impairments:  Impaired UE functional use, Pain, Decreased strength,  Decreased range of motion, Decreased activity tolerance  Visit Diagnosis: Chronic right shoulder pain  Stiffness of right shoulder, not elsewhere classified  Abnormal posture     Problem List Patient Active Problem List   Diagnosis Date Noted  . Diabetic neuropathy ([place]) 03/15/2015  . Diabetes mellitus type 2 with complications ([place]) 86/75/4492  . Hypertension   . Hyperlipidemia   . Anxiety   . Ulcerative colitis ([place])     Dickey Caamano PTA 07/28/2016, 6:26 PM  [facility] [address] [place], [place], [address] Phone: [phone]   Fax:  [phone]  Name: Kimberly Solis MRN: [id number] Date of Birth: Feb 28, 1961

## 2016-07-28 NOTE — Patient Instructions (Signed)

## 2016-07-29 ENCOUNTER — Encounter: Payer: Self-pay | Admitting: Physical Therapy

## 2016-07-29 ENCOUNTER — Ambulatory Visit: Payer: BLUE CROSS/BLUE SHIELD | Admitting: Physical Therapy

## 2016-07-29 DIAGNOSIS — M25511 Pain in right shoulder: Secondary | ICD-10-CM | POA: Diagnosis not present

## 2016-07-29 DIAGNOSIS — G8929 Other chronic pain: Secondary | ICD-10-CM

## 2016-07-29 DIAGNOSIS — M25611 Stiffness of right shoulder, not elsewhere classified: Secondary | ICD-10-CM

## 2016-07-29 DIAGNOSIS — R293 Abnormal posture: Secondary | ICD-10-CM

## 2016-07-29 NOTE — Therapy (Addendum)
[facility] [address] [place], [place], [address] Phone: [phone]   Fax:  [phone]  Physical Therapy Treatment/Discharge  Patient Details  Name: Krisinda R Key MRN: [id number] Date of Birth: 11/20/1960 Referring Provider: Christopher  Blackman  ,MD  Encounter Date: 07/29/2016      PT End of Session - 07/29/16 1229    Visit Number 9   Number of Visits 12   Date for PT Re-Evaluation 08/08/16   PT Start Time 0803   PT Stop Time 0900   PT Time Calculation (min) 57 min   Activity Tolerance Patient tolerated treatment well   Behavior During Therapy [facility]FL for tasks assessed/performed      Past Medical History:  Diagnosis Date  . Anxiety   . Diabetes mellitus without complication ([facility])   . Hyperlipidemia   . Hypertension   . Ulcerative colitis ([facility]) 05/05/2005    History reviewed. No pertinent surgical history.  There were no vitals filed for this visit.      Subjective Assessment - 07/29/16 0809    Subjective Did well after last visit.  Feels looser   Currently in Pain? Yes   Pain Score 2    Pain Location Shoulder   Pain Frequency Constant                         [facility] Adult PT Treatment/Exercise - 07/29/16 0001      Shoulder Exercises: Supine   External Rotation 10 reps   Flexion 10 reps   ABduction 10 reps   Other Supine Exercises From flexion to hip with straight elbow.  Red band 10 X 3 sets without pain increase     Shoulder Exercises: Pulleys   Other Pulley Exercises 5 minutes flexion     Shoulder Exercises: Isometric Strengthening   Extension Limitations 10 X 5 seconds  These feel good.    External Rotation Limitations No pain with any isometrics except for "Burn" with abduction isometrics.    Internal Rotation Limitations 10 x 5 with rolled towel supine     Wrist Exercises   Other wrist exercises flexion/extension stretches 5-10 x each 10 x each,  HEP     Moist Heat Therapy    Number Minutes Moist Heat 15 Minutes   Moist Heat Location Shoulder     Iontophoresis   Type of Iontophoresis Dexamethasone   Location lateral elbow   Dose 1cc,  4 mg/ml   Time 8     Manual Therapy   Manual Therapy Soft tissue mobilization   Manual therapy comments medial and lateral soft tissue work,k  retrograde  pain decreased   Soft tissue mobilization A/P glides inferior and posterior glides   grade 2 gith gentle distraction.                  PT Education - 07/29/16 0853    Education provided Yes   Education Details HEP   Person(s) Educated Patient   Methods Explanation;Demonstration;Verbal cues;Handout   Comprehension Verbalized understanding;Returned demonstration          PT Short Term Goals - 07/29/16 1339      PT SHORT TERM GOAL #1   Title She will be independent with initial HEP   Baseline independent   Time 3   Period Weeks   Status Achieved     PT SHORT TERM GOAL #2   Title She will report pain eases 25% generally during day   Baseline   20 %   Time 3   Period Weeks   Status Partially Met     PT SHORT TERM GOAL #3   Title She wiill improve flexion to 120 degrees active   Baseline 136   Time 3   Period Weeks   Status Achieved     PT SHORT TERM GOAL #4   Title She will have active RT shoulder abduciton to 110 degrees   Baseline 75( measured on pain flare day)  scaption 110   Time 3   Period Weeks   Status Partially Met           PT Long Term Goals - 07/29/16 1342      PT LONG TERM GOAL #1   Title She will have decreased pain so pain is intermittant   Baseline constant   Time 6   Period Weeks   Status On-going     PT LONG TERM GOAL #2   Title She will be able to hook bra behind back with RT arm   Baseline unable   Time 6   Period Weeks   Status On-going     PT LONG TERM GOAL #3   Title she will be able to pull on objects without pain   Baseline able to pull things at waist height.  unable lower and higher without  pain   Time 6   Period Weeks   Status On-going     PT LONG TERM GOAL #4   Title she will be able to reach into overhead cabinets to access objects without pain.    Baseline painful   Time 6   Period Weeks   Status On-going               Plan - 07/29/16 1329    Clinical Impression Statement This is the 8 th session for PT.  Pain is improving in general however she has pain flares that last the whole day.  She is independent with her exercises and is adherent. Painhas improved 20 % in general with intermitant pain spikes.  Pain has decreased from whole arm to 2 areas: anterior shoulder and elbow.  Her pain is constant.  Pulling and reaching above and below continue to be difficult.  She can pull things that are waist height. ROM last measured yesterday:  flexion136,  abd 75,  scaption 110 AROM.  STG #1 and #3 met.  STG#2 and #4 partally met.  All LTG's ongoing.    PT Next Visit Plan See what MD said.  Review HEP.and progress when ready. Assess ionto ,  continue manual.   PT Home Exercise Plan standing cane IR, extension, supine cane pullovers, rows decompression,  Self mobs,  door way stretch,  wrist extension anf flexion stretches with straight elbow.     Consulted and Agree with Plan of Care Patient      Patient will benefit from skilled therapeutic intervention in order to improve the following deficits and impairments:  Impaired UE functional use, Pain, Decreased strength, Decreased range of motion, Decreased activity tolerance  Visit Diagnosis: Chronic right shoulder pain  Stiffness of right shoulder, not elsewhere classified  Abnormal posture     Problem List Patient Active Problem List   Diagnosis Date Noted  . Diabetic neuropathy ([place]) 03/15/2015  . Diabetes mellitus type 2 with complications ([place]) 18/29/9371  . Hypertension   . Hyperlipidemia   . Anxiety   . Ulcerative colitis ([place])     Amoria Mclees PTA 07/29/2016, 1:48  PM  [facility] [address] [place], [place], [address] Phone: [phone]   Fax:  [phone]  Name: Jeanetta R Pamer MRN: [id number] Date of Birth: 03/17/1961  PHYSICAL THERAPY DISCHARGE SUMMARY  Visits from Start of Care: 9  Current functional level related to goals / functional outcomes:deficits: Unknown as she stopped coming after this visit   Education / Equipment: HEP Plan:                                                    Patient goals were partially met. Patient is being discharged due to not returning since the last visit.  ?????    Stephen Chasse, PT    09/08/16    2:50 PM 

## 2016-07-29 NOTE — Patient Instructions (Signed)
Wrist Flexor Stretch    Keeping elbow straight, grasp left hand and slowly bend wrist back until stretch is felt. Hold 10____ seconds. Relax. Repeat __5__ times per set. Do _1___ sets per session. Do 1-2 ____ sessions per day.  Copyright  VHI. All rights reserved.  Wrist Extensor Stretch    Keeping elbow straight, grasp left hand and slowly bend wrist forward until stretch is felt. Hold _10___ seconds. Relax. Repeat     5 ____ times per set. Do _1___ sets per session. Do _1-2___ sessions per day.  Copyright  VHI. All rights reserved.  Wrist Extensors      Copyright  VHI. All rights reserved.

## 2016-08-05 ENCOUNTER — Ambulatory Visit (INDEPENDENT_AMBULATORY_CARE_PROVIDER_SITE_OTHER): Payer: BLUE CROSS/BLUE SHIELD | Admitting: Physician Assistant

## 2016-08-05 DIAGNOSIS — M7711 Lateral epicondylitis, right elbow: Secondary | ICD-10-CM

## 2016-08-05 DIAGNOSIS — G8929 Other chronic pain: Secondary | ICD-10-CM

## 2016-08-05 DIAGNOSIS — M25511 Pain in right shoulder: Secondary | ICD-10-CM | POA: Diagnosis not present

## 2016-08-05 MED ORDER — METHYLPREDNISOLONE ACETATE 40 MG/ML IJ SUSP
20.0000 mg | INTRAMUSCULAR | Status: AC | PRN
  Administered 2016-08-05: 20 mg

## 2016-08-05 MED ORDER — DICLOFENAC SODIUM 75 MG PO TBEC: 75.0000 mg | DELAYED_RELEASE_TABLET | Freq: Two times a day (BID) | ORAL | 0 refills | Status: DC

## 2016-08-05 NOTE — Progress Notes (Signed)
Office Visit Note   Patient: Kimberly Solis           Date of Birth: Mar 26, 1961           MRN: [id number] Visit Date: 08/05/2016              Requested by: Orlena Sheldon, PA-C [place], [place] [address] PCP: Karis Juba, PA-C   Assessment & Plan: Visit Diagnoses:  1. Lateral epicondylitis, right elbow   2. Chronic right shoulder pain     Plan: We'll place her on diclofenac she'll stop taking any other NSAIDs. This is discussed with the patient. Next stretching for lateral epicondylitis is discussed also lifting techniques. Regards to her shoulder should continue work on range of motion strengthening. However if her shoulder pain does not continue to improve or becomes worse she'll return for further evaluation.  Follow-Up Instructions: Return if symptoms worsen or fail to improve.   Orders:  Orders Placed This Encounter  Procedures  . Hand/Upper Extremity Injection/Arthrocentesis   Meds ordered this encounter  Medications  . diclofenac (VOLTAREN) 75 MG EC tablet    Sig: Take 1 tablet (75 mg total) by mouth 2 (two) times daily.    Dispense:  60 tablet    Refill:  0      Procedures: Hand/UE Inj Date/Time: 08/05/2016 10:54 AM Performed by: Pete Pelt Authorized by: Pete Pelt   Consent Given by:  Patient Indications:  Pain Condition: lateral epicondylitis   Site:  R elbow Needle Size:  25 G Approach:  Lateral Ultrasound Guidance: No   Medications:  20 mg methylPREDNISolone acetate 40 MG/ML Patient tolerance:  Patient tolerated the procedure well with no immediate complications      Clinical Data: No additional findings.   Subjective: Right shoulder pain   right elbow pain  HPI Kimberly Solis returns today stating that her shoulder pain is overall improving and that her range of motion is improving and she still having some pain with overhead activities but particularly with extreme internal rotation. She is also having pain  lateral aspect of her right elbow. Elbow pains worse with lifting objects like a Tea pitcher or turning a doorknob. No numbness or tingling down the right arm Review of Systems See history of present illness  Objective: Vital Signs: There were no vitals taken for this visit.  Physical Exam  Constitutional: She is oriented to person, place, and time. She appears well-developed and well-nourished. No distress.  Neurological: She is alert and oriented to person, place, and time.    Ortho Exam Right elbow excellent range of motion. Tenderness over the lateral epicondyle region. Extension of the wrist against resistance causes pain lateral elbow. No rashes skin lesions ulcerations erythema or severe swelling about the elbow. Specialty Comments:  No specialty comments available.  Imaging: No results found.   PMFS History: Patient Active Problem List   Diagnosis Date Noted  . Diabetic neuropathy ([place]) 03/15/2015  . Diabetes mellitus type 2 with complications ([place]) 71/24/5809  . Hypertension   . Hyperlipidemia   . Anxiety   . Ulcerative colitis ([place])    Past Medical History:  Diagnosis Date  . Anxiety   . Diabetes mellitus without complication ([place])   . Hyperlipidemia   . Hypertension   . Ulcerative colitis ([place]) 05/05/2005    No family history on file.  No past surgical history on file. Social History   Occupational History  . Not on file.  Social History Main Topics  . Smoking status: Never Smoker  . Smokeless tobacco: Never Used  . Alcohol use No  . Drug use: No  . Sexual activity: Not on file

## 2016-08-19 ENCOUNTER — Other Ambulatory Visit: Payer: Self-pay | Admitting: Physician Assistant

## 2016-08-20 NOTE — Telephone Encounter (Signed)
Refill appropriate 

## 2016-09-11 ENCOUNTER — Encounter: Payer: Self-pay | Admitting: Physician Assistant

## 2016-09-11 ENCOUNTER — Ambulatory Visit (INDEPENDENT_AMBULATORY_CARE_PROVIDER_SITE_OTHER): Payer: BLUE CROSS/BLUE SHIELD | Admitting: Physician Assistant

## 2016-09-11 VITALS — BP 120/80 | HR 73 | Temp 97.8°F | Resp 16 | Wt 237.6 lb

## 2016-09-11 DIAGNOSIS — E785 Hyperlipidemia, unspecified: Secondary | ICD-10-CM | POA: Diagnosis not present

## 2016-09-11 DIAGNOSIS — E118 Type 2 diabetes mellitus with unspecified complications: Secondary | ICD-10-CM

## 2016-09-11 DIAGNOSIS — F419 Anxiety disorder, unspecified: Secondary | ICD-10-CM | POA: Diagnosis not present

## 2016-09-11 DIAGNOSIS — I10 Essential (primary) hypertension: Secondary | ICD-10-CM | POA: Diagnosis not present

## 2016-09-11 LAB — HEMOGLOBIN A1C, FINGERSTICK: Hgb A1C (fingerstick): 6.7 % — ABNORMAL HIGH (ref <5.7)

## 2016-09-11 MED ORDER — ROSUVASTATIN CALCIUM 10 MG PO TABS: 10.0000 mg | ORAL_TABLET | Freq: Every day | ORAL | 3 refills | Status: DC

## 2016-09-11 MED ORDER — ALPRAZOLAM 0.5 MG PO TABS: 0.5000 mg | ORAL_TABLET | Freq: Two times a day (BID) | ORAL | 2 refills | Status: DC | PRN

## 2016-09-11 NOTE — Progress Notes (Signed)
Patient ID: Kimberly Solis MRN: [id number], DOB: [birth year], 56 y.o. Date of Encounter: @DATE @  Chief Complaint:  Chief Complaint  Patient presents with  . Hypertension    3 month f/u    HPI: 56 y.o. year old white female  presents for routine followup office visit.  At her OV 02/03/13  she reported that her father recently passed away. She reported that he had been very ill for quite some time prior to his death. During that time she was just having to do what she could do to get through each day. Was having to eat whatever she could. Was having to help with transporting her mother back and forth to the hospital et [name]. She was not exercising. At 02/2014 OV she reports that she recently well and her mother to a donor tissue program at [facility]. Says it was very nice and they honored the people who have donated tissues and organs over the past year including her father.  Also at the 02/2013 visit she reported that her husband has lupus and that her daughter had just recently been diagnosed with lupus as well.   She says that she is used to working with seniors and being around seniors.   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  She says that her husband and daughter both see Dr. [name] for their Lupus.   At her OV 08/2013 she reported that her husband was having bilateral knee replacements by Dr. [name] the next day.  At f/u OV she reported that he did have the bilateral knee replacements. She says that he has done excellent. She says that rather than sending him somewhere for a rehabilitation/physical therapy, Dr. [name] allowed him to go home and for her to care for him. She says that he did excellent with this. She was able to cook healthy foods for him and keep him much more comfortable. Says that he is was just able to return to work this week. Says she is delighted to  see that he is able to return to his work which he enjoys and not be in pain.  At prior OV she told me that in the past they have already gone through their 401(k) money to pay for medical expenses. Says that now he recently had to change the mortgage so that they could figure out ways to pay for medical expenses now. Says that all of this has been extremely stressful in addition to fact that like this upcoming week she will need to be out of work to help care for him. As well her mom as well as his mom to keep him busy with them try to help care for them. Said that there was constant stress. However she says that she thinks feels that she is handling it pretty well. Today she seems to be passed the high stress and much better.   At [place] 08/1013 she reported that she recently had been having to take one Xanax every night. Says that otherwise her muscles are extremely tight and she is not sleeping well. Said the Xanax relaxes her muscles and also helps her get to sleep. She never takes it during the day and only takes it at night.  At OV--11/2013--reports that she has not needed her Xanax in over 3 weeks.  Says at this point she would like to just have  them available if she does have a day of high stress and feels uptight and having hard time to relax. However is not needing them on a routine basis like she did in the past.   At [place] says she thinks she has lost some weight since the last visit. says they have been eating lots of vegetables and eating very healthy.  Did check. At her visit 08/2013 weight was 220. Today 11/2013 weight 215. Weight down 5 pounds!!  At 08/2013 office visit-- did lab work-- revealed A1c up to 7.7. At that result note, I said to add Actos 30 mg daily. At [place] 11/2013  She said that she did not start the medicine yet because she was on his talk to me about long-term safety first.  At that 11/2013 OV we did discuss this and she was agreeable to take it if needed. However , her weight was  down and we  rechecked A1c that day --told her she did not have to add Actos. She never had to add Actos.   08/31/2014 OV--She says that she has been feeling good.  (Says that her mom has been having a lot of problems secondary to her Parkinson's). Pt states that she did not add the Actos that I recommended at the last lab 05/25/14. At that time A1c came back 7.2 and recommended to add Actos 15 mg. Today I asked her she was worried about that drug in particular that we have plenty of other options and we can add a different oral medication. She says that she was just concerned that her sugar would get too low. Says that if A1c comes back high again she is agreeable to add this and is fine with taking Actos--Has no concerns about that medication in particular--says was just concerned about BS getting too low.   08/31/2014--A1C was 7.3---At OV 12/07/2014--She reports that she DID add the Actos. Is taking Actos 27m 1 po QD.   She is taking Crestor -- however she does tell me that quite a while back she decreased the dose to taking one half of a pill. Says that she was having significant discomfort in her feet. She wasn't sure if the symptoms were from diabetic neuropathy or not. However she can cut the Crestor in half and says that the symptoms in her feet improved 90%. Says that she was on a half tablet for a long time prior to the labs in 08/2013.  05/25/14, 08/31/2014, 12/07/2014----states still cutting Crestor in half and taking CoQ10.  No symptoms and last FLP was on this dose and excellent.   12/07/2014:  She says that things have been good. Says that the only thing as the GYN added hormones mid and the and progesterone for hot flashes and this is caused weight gain. Says that she is eating the exact same thing she was before but feels like she is just blowing up. Otherwise has been feeling great. Is very excited. Her, her husband, her daughter and son[place]in[place]  Says that it has been many years since they have gone on any vacation except for just things with family [facility]  03/[facility]/15: She is in very good spirits today. Says a new baby coming to the family. [place]  Says her Gyn Rxed Phenteramine but she wanted to aks me about it before she started it.  Says he is going through  menopause. However because she had an IUD in for years she has been having problems with bleeding. Says that for these reasons her gynecologist has increased the dose of her hormones. This is causing her to have weight gain and therefore GYN prescribed phentermine. Patient states that she is taking the Actos that have been added by me in the past. Is taking all other medications as directed/as listed. She reports that her feet hurt at the end of the day and some of her toes burn. Today I discussed medications for this and initially she was deferring medication but then was agreeable to go ahead and add medicine for this. At that visit--prescribed Neurontin.  06/21/2015: Patient states that she did take the Neurontin but she isn't even having to take it anymore. Says that her feet are not even hurting anymore. Says that her gynecologist still has her on hormones and "everything is better except weight gain". She says that she really doesn't think that she is eating any more than she used to in the past and really does not think that it is causing food cravings but feels like it "is just making her swell up and blow up and gain weight". Did not use any phentermine that the gynecologist prescribed. Reviewed the labs performed here at last visit 03/15/2015 A1c was 7.0 and I recommended her increase Actos to 45 mg. She says she did make that change and is taking the Actos 45 mg. No complaints or concerns today other than the weight gain. Says that they are going to have her mother move in with them. However says that it will  be a transitional process as she has to get her pottery materials from her mom's house and make a space for those --then work on making her mother.  10/25/2015; Says her husband had rotator cuff surgery. He is out of work for 3 months. He is helping with laundry, helping with housework and it is driving her crazy b/c he does everything different than she does.  Says she never took the phenteramine. Again, says not eating any different, not doing anything different, except for the hormones from Gyn. Did finally get building to put her ceramic supplies in so can prepare for mom to move in with them.  01/31/2016: Asks what her weight is today compared to last visit. Reviewed that her weight is actually down a few pounds. She says that she is so glad. Says that since these hormone issues with GYN she has really struggled with her weight. Says that she has been eating extremely healthy and making sure not to eat anything after 6:30 PM. Says that she has her annual physical with GYN next Friday. Says that she does need a refill on her Xanax. Says that she only uses it occasionally at night about once a week. Taking Diabetes meds as directed. No adv effects.  Taking statin. No myalgias or other adv effects.  Zoloft still working well. Not feeling anxious or depressed. No adv effects.  05/29/2016: Today she reports that even in the very cold temperatures they moved her pottery and have made that space and has been working on that project. Also constantly helping with her mother who has Parkinson's and caring for her. She reports that she has had sore area in the back of her right throat that started sometime in December. Says that in the beginning she could feel something that felt like a little skin tag that she Scratched off but still has some  sore areas with redness around them. Right side of neck feels a little bit sore as well. No other specific concerns to address today. Taking Diabetes meds as  directed. No adv effects.  Taking statin. No myalgias or other adv effects.  Zoloft still working well. Not feeling anxious or depressed. No adv effects.   09/11/2016: She reports that she has gotten her mother moved in with her. She also has gotten all of her pottery moved to her house. Says she feels like some burden has been released from her-- to know that her mom is safe and sound-- and living with her. Says that she was having a lot of problems with her right shoulder and right elbow and has had a cortisone shot to the shoulder and the elbow so wanted me to be aware this may have caused increase in blood sugar/A1c. Also did physical therapy for 1-1/2 months. Says that it is much better but still has issues with the right shoulder and will continue to do the stretches and exercises. She has continued to cut Crestor 20 mg in half but is asking if I can just reduce the dose to where she doesn't have to continue cutting pills so we'll do so. Also does want refill on Xanax just to have available to use if needed. Also sometimes uses this at night and this seems to help relax her tight muscles and relax her to sleep better than any other type of medicine. Taking Diabetes meds as directed. No adv effects.  Taking statin. No myalgias or other adv effects.  Zoloft still working well. Not feeling anxious or depressed. No adv effects. --------------------------WILL DISCUSS [place] AT NEXT OV----------------------------------------------------------------   Past Medical History:  Diagnosis Date  . Anxiety   . Diabetes mellitus without complication ([place])   . Hyperlipidemia   . Hypertension   . Ulcerative colitis ([place]) 05/05/2005     Home Meds:  Outpatient Prescriptions Prior to Visit  Medication Sig Dispense Refill  . ALPRAZolam (XANAX) 0.5 MG tablet Take 1 tablet (0.5 mg total) by mouth 2 (two) times daily as needed for sleep.  60 tablet  2  . aspirin 81 MG tablet Take 81 mg by mouth daily.       . Coenzyme Q10 (COQ10 PO) Take 1 tablet by mouth daily.      . Multiple Vitamin (MULTIVITAMIN) tablet Take 1 tablet by mouth daily. [facility] Women[facility] +50      . ONE TOUCH ULTRA TEST test strip TEST SUGAR TWICE A DAY  50 each  12  . ONETOUCH DELICA LANCETS 42P MISC 1 each by Other route 2 (two) times daily.  100 each  11  . CRESTOR 20 MG tablet TAKE 1 TABLET BY MOUTH EVERY DAY  30 tablet  0  . metFORMIN (GLUCOPHAGE) 1000 MG tablet TAKE 1 TABLET BY MOUTH TWICE A DAY  60 tablet  5  . sertraline (ZOLOFT) 100 MG tablet TAKE 1 TABLET EVERY DAY   30 tablet  5  . pioglitazone (ACTOS) 30 MG tablet Take 1 tablet (30 mg total) by mouth daily.  30 tablet  2   No facility-administered medications prior to visit.     Allergies:  Allergies  Allergen Reactions  . Doxycycline Shortness Of Breath    Social History   Social History  . Marital status: Married    Spouse name: N/A  . Number of children: N/A  . Years of education: N/A   Occupational History  . Not on file.  Social History Main Topics  . Smoking status: Never Smoker  . Smokeless tobacco: Never Used  . Alcohol use No  . Drug use: No  . Sexual activity: Not on file   Other Topics Concern  . 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   She is married.   Her husband has lupus.   Her daughter was recently diagnosed with lupus.   Her mother has Parkinson's disease. Patient is involved with helping care for her.    No family history on file.   Review of Systems:  See HPI for pertinent ROS. All other ROS negative.    Physical Exam: Blood pressure 120/80, pulse 73, temperature 97.8 F (36.6 C), temperature source Oral, resp. rate 16, weight 237 lb 9.6 oz (107.8 kg), SpO2 98 %., Body mass index is 38.35 kg/m. General: Overweight white female .Appears in no acute distress. Neck: Supple. No thyromegaly. No carotid bruits. Lungs: Clear bilaterally to auscultation  without wheezes, rales, or rhonchi. Breathing is unlabored. Heart: RRR with S1 S2. No murmurs, rubs, or gallops. Abdomen: Soft, non-tender, non-distended with normoactive bowel sounds. No hepatomegaly. No rebound/guarding. No obvious abdominal masses. Musculoskeletal:  Strength and tone normal for age. Extremities/Skin: Warm and dry. No clubbing or cyanosis. No edema. No rashes or suspicious lesions. Neuro: Alert and oriented X 3. Moves all extremities spontaneously. Gait is normal. CNII-XII grossly in tact. Psych:  Responds to questions appropriately with a normal affect. Diabetic foot exam: Inspection is normal. No deformities. No ulcerations or skin breakdown. Sensation is intact to touch and monofilament test bilaterally. She has trace bilateral dorsalis pedis and posterior tibial pulses.      ASSESSMENT AND PLAN:  56 y.o. year old female with    1. Diabetes mellitus type 2 with complications - Hemoglobin A1c  - Microalbumin, urine--done 05/25/14, 10/25/2015   She had diabetic eye exam  ADDENDUM----- added 01/24/2015--- received note from her ophthalmologist from exam 01/17/2015. Exam showed no diabetic retinopathy.--YAY!! Foot exam is normal  She is on Aspirin 49m QD She is on statin. FLP excellent 02/2014 LDL 70s She is on no ACE inhibitor  secondary to low blood pressure. I have discussed again today the long-term benefits of renal protection of being on an ACE inhibitor. Discussed trying a very small dose. She defers. Says that she does not want to take any medications that she does not absolutely have to.  Pneumovax 23 given here 08/2013.  09/11/2016: Check  A1c  2. Diabetic polyneuropathy associated with type 2 diabetes mellitus ([facility] [place]---Presrcibed: Wrote down the following instructions for her:    Day 1: take 1 at night time.    Day 2: take 1 twice daily.    Starting on day 3: take one 3 times daily - gabapentin (NEURONTIN) 300 MG capsule; Take 1 capsule (300  mg total) by mouth 3 (three) times daily.  Dispense: 90 capsule; Refill: 3 At OV 06/21/15-- says that she is no longer needing this medicine and is no longer having pain in her feet even off of medication.  3. Hyperlipidemia She is on Crestor See HPI. She really ONLY TAKES 1/2 TABLET. Lipid panel was excellent at check 10/152015. LFTs normal. She says that taking 1/2 of Crestor in combination with CoQ 10 is perfect for her.  05/29/2016: Taking med as above. Recheck lipid panel and LFT today to monitor. 09/11/2016--- lipids were at goal 1/18. LFTs were  normal. Can wait to recheck these labs at next visit.  4. BP: Blood pressure is on the low side. Therefore can not add ACE inhibitor. Discussed the renal protection of an ACE inhibitor given her diabetes. However she does not want to take any medications as she does absolutely does not have to take. She defers this prescription even after I discussed prescribing just a very low dose. 09/11/2016: Blood Pressure is well controlled at goal today.  5. Anxiety This is well controlled with Zoloft. Currently, she is rarely needing Xanax. Continue to use this as needed. 09/11/2016:  stable/controlled with current medication. Taking Zoloft daily and rarely uses Xanax.  6. Mammogram: She has had in the past year per patient report. Up-to-date. Sees Gyn routinely. 02/2014--Reports that she just recently had her annual GYN exam. Reports  GYN added HRT and says that this is controlling her hot flashes and that they are 100% better and she feels so much better now. 05/2014--Reports they removed IUD  7. Colonoscopy: 2007. Repeat 10 years per patient. ---------- at [place] 06/21/15 discussed with her that colonoscopy is due this year. She says that she has already received a reminder / notification from Dr. Collene Mares and patient states that she will call and schedule follow-up there.  8. Immunizations: Influenza vaccine given here 02/16/2014, 01/31/2016 Tetanus: She reports  that this is up-to-date. Pneumonia vaccine:  Pneumovax 23 given here 08/2013.  No further pneumonia vaccine until age 40 --------------------------WILL DISCUSS [place]----------------------------------------------------------------  Weight Gain At [place] 06/21/15 I reviewed her prior weights. 08/31/14------------- 216 12/07/14----------------221 03/15/15------------ 225 06/21/15------------ 229 10/25/2015----------234 01/31/2016----------228 At [place] 06/21/2015--Recommend that she go ahead and take the phentermine daily. Recommend that she check her weight on her home scales today and document that as her baseline weight and then wait 2 weeks to recheck her weight. If weight stable or lower at that time, then continue the phentermine. If weight higher in 2 weeks-- then she needs to call gynecologist and inform them and see about changing the hormones. At [place] 10/25/2015---says she never took phenteramine.   Follow up office visit 3 months, sooner if needed.   Signed, [address] [place], [place], [facility] 09/11/2016 8:49 AM

## 2016-09-17 ENCOUNTER — Other Ambulatory Visit: Payer: Self-pay | Admitting: Physician Assistant

## 2016-09-17 NOTE — Telephone Encounter (Signed)
Patient is asking for 90 day supply is it okay?

## 2016-09-17 NOTE — Telephone Encounter (Signed)
Rx filled

## 2016-09-17 NOTE — Telephone Encounter (Signed)
Yes. Approved. #[id number].

## 2016-10-21 ENCOUNTER — Other Ambulatory Visit: Payer: Self-pay | Admitting: Physician Assistant

## 2016-10-21 NOTE — Telephone Encounter (Signed)
Refill appropriate 

## 2016-12-06 ENCOUNTER — Other Ambulatory Visit: Payer: Self-pay | Admitting: Physician Assistant

## 2016-12-08 NOTE — Telephone Encounter (Signed)
Refill appropriate 

## 2016-12-11 ENCOUNTER — Ambulatory Visit (INDEPENDENT_AMBULATORY_CARE_PROVIDER_SITE_OTHER): Payer: BLUE CROSS/BLUE SHIELD | Admitting: Physician Assistant

## 2016-12-11 ENCOUNTER — Encounter: Payer: Self-pay | Admitting: Physician Assistant

## 2016-12-11 VITALS — BP 118/74 | HR 85 | Temp 97.8°F | Resp 16 | Wt 233.4 lb

## 2016-12-11 DIAGNOSIS — E118 Type 2 diabetes mellitus with unspecified complications: Secondary | ICD-10-CM | POA: Diagnosis not present

## 2016-12-11 DIAGNOSIS — I10 Essential (primary) hypertension: Secondary | ICD-10-CM | POA: Diagnosis not present

## 2016-12-11 DIAGNOSIS — K51919 Ulcerative colitis, unspecified with unspecified complications: Secondary | ICD-10-CM | POA: Diagnosis not present

## 2016-12-11 DIAGNOSIS — E1342 Other specified diabetes mellitus with diabetic polyneuropathy: Secondary | ICD-10-CM | POA: Diagnosis not present

## 2016-12-11 DIAGNOSIS — E785 Hyperlipidemia, unspecified: Secondary | ICD-10-CM

## 2016-12-11 DIAGNOSIS — F419 Anxiety disorder, unspecified: Secondary | ICD-10-CM

## 2016-12-11 DIAGNOSIS — Z23 Encounter for immunization: Secondary | ICD-10-CM | POA: Diagnosis not present

## 2016-12-11 LAB — COMPLETE METABOLIC PANEL WITH GFR
ALT: 12 U/L (ref 6–29)
AST: 16 U/L (ref 10–35)
Albumin: 4.3 g/dL (ref 3.6–5.1)
Alkaline Phosphatase: 69 U/L (ref 33–130)
BUN: 14 mg/dL (ref 7–25)
CO2: 25 mmol/L (ref 20–32)
Calcium: 9.6 mg/dL (ref 8.6–10.4)
Chloride: 103 mmol/L (ref 98–110)
Creat: 0.77 mg/dL (ref 0.50–1.05)
GFR, EST NON AFRICAN AMERICAN: 87 mL/min (ref >=60)
GFR, Est African American: >89 mL/min (ref >=60)
GLUCOSE: 119 mg/dL — AB (ref 70–99)
POTASSIUM: 5.1 mmol/L (ref 3.5–5.3)
SODIUM: 140 mmol/L (ref 135–146)
Total Bilirubin: 0.3 mg/dL (ref 0.2–1.2)
Total Protein: 6.8 g/dL (ref 6.1–8.1)

## 2016-12-11 LAB — LIPID PANEL
CHOLESTEROL: 169 mg/dL (ref <200)
HDL: 50 mg/dL — AB (ref >50)
LDL Cholesterol: 89 mg/dL (ref <100)
TRIGLYCERIDES: 151 mg/dL — AB (ref <150)
Total CHOL/HDL Ratio: 3.4 Ratio (ref <5.0)
VLDL: 30 mg/dL (ref <30)

## 2016-12-11 NOTE — Addendum Note (Signed)
Addended by: Vonna Kotyk A on: 12/11/2016 08:45 AM   Modules accepted: Orders

## 2016-12-11 NOTE — Progress Notes (Addendum)
Patient ID: Kimberly Solis MRN: [id number], DOB: [birth year], 56 y.o. Date of Encounter: @DATE @  Chief Complaint:  No chief complaint on file.   HPI: 56 y.o. year old white female  presents for routine followup office visit.  At her OV 02/03/13  she reported that her father recently passed away. She reported that he had been very ill for quite some time prior to his death. During that time she was just having to do what she could do to get through each day. Was having to eat whatever she could. Was having to help with transporting her mother back and forth to the hospital et [name]. She was not exercising. At 02/2014 OV she reports that she recently well and her mother to a donor tissue program at [facility]. Says it was very nice and they honored the people who have donated tissues and organs over the past year including her father.  Also at the 02/2013 visit she reported that her husband has lupus and that her daughter had just recently been diagnosed with lupus as well.   She says that she is used to working with seniors and being around seniors.   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  She says that her husband and daughter both see Dr. [name] for their Lupus.   At her OV 08/2013 she reported that her husband was having bilateral knee replacements by Dr. [name] the next day.  At f/u OV she reported that he did have the bilateral knee replacements. She says that he has done excellent. She says that rather than sending him somewhere for a rehabilitation/physical therapy, Dr. [name] allowed him to go home and for her to care for him. She says that he did excellent with this. She was able to cook healthy foods for him and keep him much more comfortable. Says that he is was just able to return to work this week. Says she is delighted to see that he is able to return to his work which  he enjoys and not be in pain.  At prior OV she told me that in the past they have already gone through their 401(k) money to pay for medical expenses. Says that now he recently had to change the mortgage so that they could figure out ways to pay for medical expenses now. Says that all of this has been extremely stressful in addition to fact that like this upcoming week she will need to be out of work to help care for him. As well her mom as well as his mom to keep him busy with them try to help care for them. Said that there was constant stress. However she says that she thinks feels that she is handling it pretty well. Today she seems to be passed the high stress and much better.   At [place] 08/1013 she reported that she recently had been having to take one Xanax every night. Says that otherwise her muscles are extremely tight and she is not sleeping well. Said the Xanax relaxes her muscles and also helps her get to sleep. She never takes it during the day and only takes it at night.  At OV--11/2013--reports that she has not needed her Xanax in over 3 weeks.  Says at this point she would like to just have them available if she does have a day of high stress  and feels uptight and having hard time to relax. However is not needing them on a routine basis like she did in the past.   At [place] says she thinks she has lost some weight since the last visit. says they have been eating lots of vegetables and eating very healthy.  Did check. At her visit 08/2013 weight was 220. Today 11/2013 weight 215. Weight down 5 pounds!!  At 08/2013 office visit-- did lab work-- revealed A1c up to 7.7. At that result note, I said to add Actos 30 mg daily. At [place] 11/2013  She said that she did not start the medicine yet because she was on his talk to me about long-term safety first.  At that 11/2013 OV we did discuss this and she was agreeable to take it if needed. However , her weight was down and we  rechecked A1c that day --told her  she did not have to add Actos. She never had to add Actos.   08/31/2014 OV--She says that she has been feeling good.  (Says that her mom has been having a lot of problems secondary to her Parkinson's). Pt states that she did not add the Actos that I recommended at the last lab 05/25/14. At that time A1c came back 7.2 and recommended to add Actos 15 mg. Today I asked her she was worried about that drug in particular that we have plenty of other options and we can add a different oral medication. She says that she was just concerned that her sugar would get too low. Says that if A1c comes back high again she is agreeable to add this and is fine with taking Actos--Has no concerns about that medication in particular--says was just concerned about BS getting too low.   08/31/2014--A1C was 7.3---At OV 12/07/2014--She reports that she DID add the Actos. Is taking Actos 67m 1 po QD.   She is taking Crestor -- however she does tell me that quite a while back she decreased the dose to taking one half of a pill. Says that she was having significant discomfort in her feet. She wasn't sure if the symptoms were from diabetic neuropathy or not. However she can cut the Crestor in half and says that the symptoms in her feet improved 90%. Says that she was on a half tablet for a long time prior to the labs in 08/2013.  05/25/14, 08/31/2014, 12/07/2014----states still cutting Crestor in half and taking CoQ10.  No symptoms and last FLP was on this dose and excellent.   12/07/2014:  She says that things have been good. Says that the only thing as the GYN added hormones mid and the and progesterone for hot flashes and this is caused weight gain. Says that she is eating the exact same thing she was before but feels like she is just blowing up. Otherwise has been feeling great. Is very excited. Her, her husband, her daughter and son-in[place]. Says that it has been many years since they  have gone on any vacation except for just things with family or just [facility]  03/15/15: She is in very good spirits today. Says a new baby coming to the family. [place]  Says her Gyn Rxed Phenteramine but she wanted to aks me about it before she started it.  Says he is going through menopause. However because she had an IUD in for years she  has been having problems with bleeding. Says that for these reasons her gynecologist has increased the dose of her hormones. This is causing her to have weight gain and therefore GYN prescribed phentermine. Patient states that she is taking the Actos that have been added by me in the past. Is taking all other medications as directed/as listed. She reports that her feet hurt at the end of the day and some of her toes burn. Today I discussed medications for this and initially she was deferring medication but then was agreeable to go ahead and add medicine for this. At that visit--prescribed Neurontin.  06/21/2015: Patient states that she did take the Neurontin but she isn't even having to take it anymore. Says that her feet are not even hurting anymore. Says that her gynecologist still has her on hormones and "everything is better except weight gain". She says that she really doesn't think that she is eating any more than she used to in the past and really does not think that it is causing food cravings but feels like it "is just making her swell up and blow up and gain weight". Did not use any phentermine that the gynecologist prescribed. Reviewed the labs performed here at last visit 03/15/2015 A1c was 7.0 and I recommended her increase Actos to 45 mg. She says she did make that change and is taking the Actos 45 mg. No complaints or concerns today other than the weight gain. Says that they are going to have her mother move in with them. However says that it will be a transitional process as she has to get  her pottery materials from her mom's house and make a space for those --then work on making her mother.  10/25/2015; Says her husband had rotator cuff surgery. He is out of work for 3 months. He is helping with laundry, helping with housework and it is driving her crazy b/c he does everything different than she does.  Says she never took the phenteramine. Again, says not eating any different, not doing anything different, except for the hormones from Gyn. Did finally get building to put her ceramic supplies in so can prepare for mom to move in with them.  01/31/2016: Asks what her weight is today compared to last visit. Reviewed that her weight is actually down a few pounds. She says that she is so glad. Says that since these hormone issues with GYN she has really struggled with her weight. Says that she has been eating extremely healthy and making sure not to eat anything after 6:30 PM. Says that she has her annual physical with GYN next Friday. Says that she does need a refill on her Xanax. Says that she only uses it occasionally at night about once a week. Taking Diabetes meds as directed. No adv effects.  Taking statin. No myalgias or other adv effects.  Zoloft still working well. Not feeling anxious or depressed. No adv effects.  05/29/2016: Today she reports that even in the very cold temperatures they moved her pottery and have made that space and has been working on that project. Also constantly helping with her mother who has Parkinson's and caring for her. She reports that she has had sore area in the back of her right throat that started sometime in December. Says that in the beginning she could feel something that felt like a little skin tag that she Scratched off but still has some sore areas with redness around them. Right side of neck feels  a little bit sore as well. No other specific concerns to address today. Taking Diabetes meds as directed. No adv effects.  Taking statin. No  myalgias or other adv effects.  Zoloft still working well. Not feeling anxious or depressed. No adv effects.   09/11/2016: She reports that she has gotten her mother moved in with her. She also has gotten all of her pottery moved to her house. Says she feels like some burden has been released from her-- to know that her mom is safe and sound-- and living with her. Says that she was having a lot of problems with her right shoulder and right elbow and has had a cortisone shot to the shoulder and the elbow so wanted me to be aware this may have caused increase in blood sugar/A1c. Also did physical therapy for 1-1/2 months. Says that it is much better but still has issues with the right shoulder and will continue to do the stretches and exercises. She has continued to cut Crestor 20 mg in half but is asking if I can just reduce the dose to where she doesn't have to continue cutting pills so we'll do so. Also does want refill on Xanax just to have available to use if needed. Also sometimes uses this at night and this seems to help relax her tight muscles and relax her to sleep better than any other type of medicine. Taking Diabetes meds as directed. No adv effects.  Taking statin. No myalgias or other adv effects.  Zoloft still working well. Not feeling anxious or depressed. No adv effects.    12/11/2016: I asked how things are going now that she has her mom moved in and her pottery moved to her house. I thought she would finally feel settled and have some peace of mine. Instead, she says that her mom is about driving her crazy. Says her mom will ask the same question or say the same thing over and over. She says that once she gets something on her mind she will not let it go. They have been getting boxes of tomatoes from [employer] and making spaghetti sauce. Has gone through many many boxes of tomatoes. Also her mom was in the habit of doing very small load of laundry but doing it every day. Would wash  the clothes from the day before and also would wash her sheets etc. Patient has been having to get her to realize that they are not continue that habit. Otherwise patient has been doing well. She is taking the Crestor daily. Myalgias or other adverse effects. She is taking the Zoloft daily and uses the Xanax if needed. The Zoloft is working well and is controlling her anxiety depression. He denies and it is an adjustment with her mom and she does feel that these are controlled. She is taking her diabetic medicines as directed. Is having no low blood sugars.  Addendum Added:  I received office note from diabetic eye exam. Performed 02/24/2017. Revealed no retinopathy.   Past Medical History:  Diagnosis Date  . Anxiety   . Diabetes mellitus without complication ([place])   . Hyperlipidemia   . Hypertension   . Ulcerative colitis ([place]) 05/05/2005     Home Meds:  Outpatient Prescriptions Prior to Visit  Medication Sig Dispense Refill  . ALPRAZolam (XANAX) 0.5 MG tablet Take 1 tablet (0.5 mg total) by mouth 2 (two) times daily as needed for sleep.  60 tablet  2  . aspirin 81 MG tablet Take  81 mg by mouth daily.      . Coenzyme Q10 (COQ10 PO) Take 1 tablet by mouth daily.      . Multiple Vitamin (MULTIVITAMIN) tablet Take 1 tablet by mouth daily. [facility] Women[facility] +50      . ONE TOUCH ULTRA TEST test strip TEST SUGAR TWICE A DAY  50 each  12  . ONETOUCH DELICA LANCETS 40C MISC 1 each by Other route 2 (two) times daily.  100 each  11  . CRESTOR 20 MG tablet TAKE 1 TABLET BY MOUTH EVERY DAY  30 tablet  0  . metFORMIN (GLUCOPHAGE) 1000 MG tablet TAKE 1 TABLET BY MOUTH TWICE A DAY  60 tablet  5  . sertraline (ZOLOFT) 100 MG tablet TAKE 1 TABLET EVERY DAY   30 tablet  5  . pioglitazone (ACTOS) 30 MG tablet Take 1 tablet (30 mg total) by mouth daily.  30 tablet  2   No facility-administered medications prior to visit.     Allergies:  Allergies  Allergen Reactions  . Doxycycline Shortness Of Breath     Social History   Social History  . Marital status: Married    Spouse name: N/A  . Number of children: N/A  . Years of education: N/A   Occupational History  . Not on file.   Social History Main Topics  . Smoking status: Never Smoker  . Smokeless tobacco: Never Used  . Alcohol use No  . Drug use: No  . Sexual activity: Not on file   Other Topics Concern  . 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   She is married.   Her husband has lupus.   Her daughter was recently diagnosed with lupus.   Her mother has Parkinson's disease. Patient is involved with helping care for her.    No family history on file.   Review of Systems:  See HPI for pertinent ROS. All other ROS negative.    Physical Exam: Blood pressure 118/74, pulse 85, temperature 97.8 F (36.6 C), resp. rate 16, weight 233 lb 6.4 oz (105.9 kg), SpO2 98 %., There is no height or weight on file to calculate BMI. General: Overweight white female .Appears in no acute distress. Neck: Supple. No thyromegaly. No carotid bruits. Lungs: Clear bilaterally to auscultation without wheezes, rales, or rhonchi. Breathing is unlabored. Heart: RRR with S1 S2. No murmurs, rubs, or gallops. Abdomen: Soft, non-tender, non-distended with normoactive bowel sounds. No hepatomegaly. No rebound/guarding. No obvious abdominal masses. Musculoskeletal:  Strength and tone normal for age. Extremities/Skin: Warm and dry. No clubbing or cyanosis. No edema. No rashes or suspicious lesions. Neuro: Alert and oriented X 3. Moves all extremities spontaneously. Gait is normal. CNII-XII grossly in tact. Psych:  Responds to questions appropriately with a normal affect. Diabetic foot exam: Inspection is normal. No deformities. No ulcerations or skin breakdown. Sensation is intact to touch and monofilament test bilaterally. She has trace bilateral dorsalis pedis and posterior tibial  pulses.      ASSESSMENT AND PLAN:  56 y.o. year old female with    1. Diabetes mellitus type 2 with complications - Hemoglobin A1c  - Microalbumin, urine--done 05/25/14, 10/25/2015, 12/11/2016   She had diabetic eye exam  ADDENDUM----- added 01/24/2015--- received note from her ophthalmologist from exam 01/17/2015. Exam showed no diabetic retinopathy. Addendum Added:  I received office note from diabetic eye exam. Performed 02/24/2017. Revealed no retinopathy.  Foot  exam is normal  She is on Aspirin 38m QD She is on statin. FLP excellent 02/2014 LDL 70s She is on no ACE inhibitor  secondary to low blood pressure. I have discussed again today the long-term benefits of renal protection of being on an ACE inhibitor. Discussed trying a very small dose. She defers. Says that she does not want to take any medications that she does not absolutely have to.  Pneumovax 23 given here 08/2013.  12/11/2016: Check  A1c, MicroAlbumin   2. Diabetic polyneuropathy associated with type 2 diabetes mellitus ([place] [employer] At [place]11/16/2016---Presrcibed: Wrote down the following instructions for her:    Day 1: take 1 at night time.    Day 2: take 1 twice daily.    Starting on day 3: take one 3 times daily - gabapentin (NEURONTIN) 300 MG capsule; Take 1 capsule (300 mg total) by mouth 3 (three) times daily.  Dispense: 90 capsule; Refill: 3 At OV 06/21/15-- says that she is no longer needing this medicine and is no longer having pain in her feet even off of medication.  3. Hyperlipidemia She is on Crestor See HPI. She really ONLY TAKES 1/2 TABLET. Lipid panel was excellent at check 10/152015. LFTs normal. She says that taking 1/2 of Crestor in combination with CoQ 10 is perfect for her.  05/29/2016: Taking med as above. Recheck lipid panel and LFT today to monitor. 09/11/2016--- lipids were at goal 1/18. LFTs were normal. Can wait to recheck these labs at next visit. 12/11/2016----On Crestor. Recheck FLP/LFT now.  4.  BP: Blood pressure is on the low side. Therefore can not add ACE inhibitor. Discussed the renal protection of an ACE inhibitor given her diabetes. However she does not want to take any medications as she does absolutely does not have to take. She defers this prescription even after I discussed prescribing just a very low dose. 12/11/2016: Blood Pressure is well controlled at goal today.  5. Anxiety This is well controlled with Zoloft. Currently, she is rarely needing Xanax. Continue to use this as needed. 8/92018:  stable/controlled with current medication. Taking Zoloft daily and rarely uses Xanax.  6. Mammogram: She has had in the past year per patient report. Up-to-date. Sees Gyn routinely. 02/2014--Reports that she just recently had her annual GYN exam. Reports  GYN added HRT and says that this is controlling her hot flashes and that they are 100% better and she feels so much better now. 05/2014--Reports they removed IUD  7. Colonoscopy: 2007. Repeat 10 years per patient. ---------- at [place]/16/17 discussed with her that colonoscopy is due this year. She says that she has already received a reminder / notification from Dr. MCollene Maresand patient states that she will call and schedule follow-up there. At OHospers8/2018 asked patient if she ever had follow-up colonoscopy with Dr. MCollene Mares She says that she has not that that she had problems with the last one because she has ulcerative colitis. Is not want to do another colonoscopy right now. Says that her gynecologist has her do Hemoccult cards every year.  8. Immunizations: Influenza vaccine given here 02/16/2014, 01/31/2016 Tetanus:  Given 12/11/2016 Pneumonia vaccine:  Pneumovax 23 given here 08/2013. No further pneumonia vaccine until age [id number]---Discussed at OBieber8/01/2017----she is to contact her insurance and find out regarding coverage and cost then let [place] if she wants this.  Weight Gain At OV 06/21/15 I reviewed her prior  weights. 08/31/14------------- 216 12/07/14----------------221 03/15/15------------ 225 06/21/15------------ 229 10/25/2015----------234 01/31/2016----------228 At  OV 06/21/2015--Recommend that she go ahead and take the phentermine daily. Recommend that she check her weight on her home scales today and document that as her baseline weight and then wait 2 weeks to recheck her weight. If weight stable or lower at that time, then continue the phentermine. If weight higher in 2 weeks-- then she needs to call gynecologist and inform them and see about changing the hormones. At [place] 10/25/2015---says she never took phenteramine.   Follow up office visit 3 months, sooner if needed.   Signed, [address] [place], [place], [facility] 12/11/2016 8:02 AM

## 2016-12-12 LAB — HEMOGLOBIN A1C
Hgb A1c MFr Bld: 6.3 % — ABNORMAL HIGH (ref <5.7)
Mean Plasma Glucose: 134 mg/dL

## 2016-12-12 LAB — MICROALBUMIN, URINE: Microalb, Ur: 1.2 mg/dL

## 2016-12-19 ENCOUNTER — Other Ambulatory Visit: Payer: Self-pay | Admitting: Physician Assistant

## 2016-12-22 NOTE — Telephone Encounter (Signed)
Refill appropriate 

## 2017-02-06 ENCOUNTER — Other Ambulatory Visit: Payer: Self-pay | Admitting: Family Medicine

## 2017-02-06 DIAGNOSIS — Z1231 Encounter for screening mammogram for malignant neoplasm of breast: Secondary | ICD-10-CM

## 2017-02-24 LAB — HM DIABETES EYE EXAM

## 2017-02-27 ENCOUNTER — Ambulatory Visit: Payer: BLUE CROSS/BLUE SHIELD

## 2017-03-03 ENCOUNTER — Encounter: Payer: Self-pay | Admitting: Family Medicine

## 2017-03-06 ENCOUNTER — Other Ambulatory Visit: Payer: Self-pay | Admitting: Physician Assistant

## 2017-03-06 NOTE — Telephone Encounter (Signed)
Refill appropriate 

## 2017-03-11 ENCOUNTER — Encounter: Payer: Self-pay | Admitting: Physician Assistant

## 2017-03-12 ENCOUNTER — Ambulatory Visit (INDEPENDENT_AMBULATORY_CARE_PROVIDER_SITE_OTHER): Payer: BLUE CROSS/BLUE SHIELD | Admitting: Physician Assistant

## 2017-03-12 ENCOUNTER — Other Ambulatory Visit: Payer: Self-pay

## 2017-03-12 ENCOUNTER — Encounter: Payer: Self-pay | Admitting: Physician Assistant

## 2017-03-12 VITALS — BP 120/72 | HR 72 | Temp 98.3°F | Resp 14 | Ht 66.0 in | Wt 234.4 lb

## 2017-03-12 DIAGNOSIS — E118 Type 2 diabetes mellitus with unspecified complications: Secondary | ICD-10-CM | POA: Diagnosis not present

## 2017-03-12 DIAGNOSIS — Z1159 Encounter for screening for other viral diseases: Secondary | ICD-10-CM | POA: Diagnosis not present

## 2017-03-12 DIAGNOSIS — IMO0002 Reserved for concepts with insufficient information to code with codable children: Secondary | ICD-10-CM

## 2017-03-12 DIAGNOSIS — E1165 Type 2 diabetes mellitus with hyperglycemia: Secondary | ICD-10-CM

## 2017-03-12 DIAGNOSIS — E785 Hyperlipidemia, unspecified: Secondary | ICD-10-CM | POA: Diagnosis not present

## 2017-03-12 DIAGNOSIS — Z23 Encounter for immunization: Secondary | ICD-10-CM

## 2017-03-12 DIAGNOSIS — F419 Anxiety disorder, unspecified: Secondary | ICD-10-CM | POA: Diagnosis not present

## 2017-03-12 DIAGNOSIS — Z114 Encounter for screening for human immunodeficiency virus [HIV]: Secondary | ICD-10-CM | POA: Diagnosis not present

## 2017-03-12 DIAGNOSIS — E0841 Diabetes mellitus due to underlying condition with diabetic mononeuropathy: Secondary | ICD-10-CM | POA: Diagnosis not present

## 2017-03-12 DIAGNOSIS — I10 Essential (primary) hypertension: Secondary | ICD-10-CM | POA: Diagnosis not present

## 2017-03-12 MED ORDER — ONETOUCH DELICA LANCETS FINE MISC: 11 refills | Status: DC

## 2017-03-12 MED ORDER — GLUCOSE BLOOD VI STRP
1.0000 | ORAL_STRIP | Freq: Two times a day (BID) | 12 refills | Status: DC
Start: 2017-03-12 — End: 2017-05-04

## 2017-03-12 NOTE — Progress Notes (Signed)
Patient ID: Kimberly Solis MRN: [id number], DOB: [birth year], 56 y.o. Date of Encounter: @DATE @  Chief Complaint:  Chief Complaint  Patient presents with  . 3 month routine follow up    HPI: 56 y.o. year old white female  presents for routine followup office visit.  At her OV 02/03/13  she reported that her father recently passed away. She reported that he had been very ill for quite some time prior to his death. During that time she was just having to do what she could do to get through each day. Was having to eat whatever she could. Was having to help with transporting her mother back and forth to the hospital et [name]. She was not exercising. At 02/2014 OV she reports that she recently well and her mother to a donor tissue program at [facility]. Says it was very nice and they honored the people who have donated tissues and organs over the past year including her father.  Also at the 02/2013 visit she reported that her husband has lupus and that her daughter had just recently been diagnosed with lupus as well.   She says that she is used to working with seniors and being around seniors.   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  She says that her husband and daughter both see Dr. [name] for their Lupus.   At her OV 08/2013 she reported that her husband was having bilateral knee replacements by Dr. [name] the next day.  At f/u OV she reported that he did have the bilateral knee replacements. She says that he has done excellent. She says that rather than sending him somewhere for a rehabilitation/physical therapy, Dr. [name] allowed him to go home and for her to care for him. She says that he did excellent with this. She was able to cook healthy foods for him and keep him much more comfortable. Says that he is was just able to return to work this week. Says she is delighted to see  that he is able to return to his work which he enjoys and not be in pain.  At prior OV she told me that in the past they have already gone through their 401(k) money to pay for medical expenses. Says that now he recently had to change the mortgage so that they could figure out ways to pay for medical expenses now. Says that all of this has been extremely stressful in addition to fact that like this upcoming week she will need to be out of work to help care for him. As well her mom as well as his mom to keep him busy with them try to help care for them. Said that there was constant stress. However she says that she thinks feels that she is handling it pretty well. Today she seems to be passed the high stress and much better.   At [place] 08/1013 she reported that she recently had been having to take one Xanax every night. Says that otherwise her muscles are extremely tight and she is not sleeping well. Said the Xanax relaxes her muscles and also helps her get to sleep. She never takes it during the day and only takes it at night.  At OV--11/2013--reports that she has not needed her Xanax in over 3 weeks.  Says at this point she would like to just have them available  if she does have a day of high stress and feels uptight and having hard time to relax. However is not needing them on a routine basis like she did in the past.   At [place] says she thinks she has lost some weight since the last visit. says they have been eating lots of vegetables and eating very healthy.  Did check. At her visit 08/2013 weight was 220. Today 11/2013 weight 215. Weight down 5 pounds!!  At 08/2013 office visit-- did lab work-- revealed A1c up to 7.7. At that result note, I said to add Actos 30 mg daily. At [place] 11/2013  She said that she did not start the medicine yet because she was on his talk to me about long-term safety first.  At that 11/2013 OV we did discuss this and she was agreeable to take it if needed. However , her weight was  down and we  rechecked A1c that day --told her she did not have to add Actos. She never had to add Actos.   08/31/2014 OV--She says that she has been feeling good.  (Says that her mom has been having a lot of problems secondary to her Parkinson's). Pt states that she did not add the Actos that I recommended at the last lab 05/25/14. At that time A1c came back 7.2 and recommended to add Actos 15 mg. Today I asked her she was worried about that drug in particular that we have plenty of other options and we can add a different oral medication. She says that she was just concerned that her sugar would get too low. Says that if A1c comes back high again she is agreeable to add this and is fine with taking Actos--Has no concerns about that medication in particular--says was just concerned about BS getting too low.   08/31/2014--A1C was 7.3---At OV 12/07/2014--She reports that she DID add the Actos. Is taking Actos 84m 1 po QD.   She is taking Crestor -- however she does tell me that quite a while back she decreased the dose to taking one half of a pill. Says that she was having significant discomfort in her feet. She wasn't sure if the symptoms were from diabetic neuropathy or not. However she can cut the Crestor in half and says that the symptoms in her feet improved 90%. Says that she was on a half tablet for a long time prior to the labs in 08/2013.  05/25/14, 08/31/2014, 12/07/2014----states still cutting Crestor in half and taking CoQ10.  No symptoms and last FLP was on this dose and excellent.   12/07/2014:  She says that things have been good. Says that the only thing as the GYN added hormones mid and the and progesterone for hot flashes and this is caused weight gain. Says that she is eating the exact same thing she was before but feels like she is just blowing up. Otherwise has been feeling great. Is very excited. Her, her husband, her daughter and son-in[place]  Says that it has been many years since they have gone on any vacation except for just things with family or [facility]  03/15/15: She is in very good spirits today. Says a new baby coming to the family. Says her [place] time in [place]  Says her Gyn Rxed Phenteramine but she wanted to aks me about it before she started it.  Says he is going through menopause. However  because she had an IUD in for years she has been having problems with bleeding. Says that for these reasons her gynecologist has increased the dose of her hormones. This is causing her to have weight gain and therefore GYN prescribed phentermine. Patient states that she is taking the Actos that have been added by me in the past. Is taking all other medications as directed/as listed. She reports that her feet hurt at the end of the day and some of her toes burn. Today I discussed medications for this and initially she was deferring medication but then was agreeable to go ahead and add medicine for this. At that visit--prescribed Neurontin.  06/21/2015: Patient states that she did take the Neurontin but she isn't even having to take it anymore. Says that her feet are not even hurting anymore. Says that her gynecologist still has her on hormones and "everything is better except weight gain". She says that she really doesn't think that she is eating any more than she used to in the past and really does not think that it is causing food cravings but feels like it "is just making her swell up and blow up and gain weight". Did not use any phentermine that the gynecologist prescribed. Reviewed the labs performed here at last visit 03/15/2015 A1c was 7.0 and I recommended her increase Actos to 45 mg. She says she did make that change and is taking the Actos 45 mg. No complaints or concerns today other than the weight gain. Says that they are going to have her mother move in with them. However says that it will  be a transitional process as she has to get her pottery materials from her mom's house and make a space for those --then work on making her mother.  10/25/2015; Says her husband had rotator cuff surgery. He is out of work for 3 months. He is helping with laundry, helping with housework and it is driving her crazy b/c he does everything different than she does.  Says she never took the phenteramine. Again, says not eating any different, not doing anything different, except for the hormones from Gyn. Did finally get building to put her ceramic supplies in so can prepare for mom to move in with them.  01/31/2016: Asks what her weight is today compared to last visit. Reviewed that her weight is actually down a few pounds. She says that she is so glad. Says that since these hormone issues with GYN she has really struggled with her weight. Says that she has been eating extremely healthy and making sure not to eat anything after 6:30 PM. Says that she has her annual physical with GYN next Friday. Says that she does need a refill on her Xanax. Says that she only uses it occasionally at night about once a week. Taking Diabetes meds as directed. No adv effects.  Taking statin. No myalgias or other adv effects.  Zoloft still working well. Not feeling anxious or depressed. No adv effects.  05/29/2016: Today she reports that even in the very cold temperatures they moved her pottery and have made that space and has been working on that project. Also constantly helping with her mother who has Parkinson's and caring for her. She reports that she has had sore area in the back of her right throat that started sometime in December. Says that in the beginning she could feel something that felt like a little skin tag that she Scratched off but still has some sore areas  with redness around them. Right side of neck feels a little bit sore as well. No other specific concerns to address today. Taking Diabetes meds as  directed. No adv effects.  Taking statin. No myalgias or other adv effects.  Zoloft still working well. Not feeling anxious or depressed. No adv effects.   09/11/2016: She reports that she has gotten her mother moved in with her. She also has gotten all of her pottery moved to her house. Says she feels like some burden has been released from her-- to know that her mom is safe and sound-- and living with her. Says that she was having a lot of problems with her right shoulder and right elbow and has had a cortisone shot to the shoulder and the elbow so wanted me to be aware this may have caused increase in blood sugar/A1c. Also did physical therapy for 1-1/2 months. Says that it is much better but still has issues with the right shoulder and will continue to do the stretches and exercises. She has continued to cut Crestor 20 mg in half but is asking if I can just reduce the dose to where she doesn't have to continue cutting pills so we'll do so. Also does want refill on Xanax just to have available to use if needed. Also sometimes uses this at night and this seems to help relax her tight muscles and relax her to sleep better than any other type of medicine. Taking Diabetes meds as directed. No adv effects.  Taking statin. No myalgias or other adv effects.  Zoloft still working well. Not feeling anxious or depressed. No adv effects.    12/11/2016: I asked how things are going now that she has her mom moved in and her pottery moved to her house. I thought she would finally feel settled and have some peace of mine. Instead, she says that her mom is about driving her crazy. Says her mom will ask the same question or say the same thing over and over. She says that once she gets something on her mind she will not let it go. They have been getting boxes of tomatoes from [place] and making spaghetti sauce. Has gone through many many boxes of tomatoes. Also her mom was in the habit of doing very small load of  laundry but doing it every day. Would wash the clothes from the day before and also would wash her sheets etc. Patient has been having to get her to realize that they are not continue that habit. Otherwise patient has been doing well. She is taking the Crestor daily. Myalgias or other adverse effects. She is taking the Zoloft daily and uses the Xanax if needed. The Zoloft is working well and is controlling her anxiety depression. He denies and it is an adjustment with her mom and she does feel that these are controlled. She is taking her diabetic medicines as directed. Is having no low blood sugars.  Addendum Added:  I received office note from diabetic eye exam. Performed 02/24/2017. Revealed no retinopathy.  03/12/2017: Today patient states that her mom stays with her 3 days a week and stays with her sister the other days of the week.  Says that this is working much better.  Says that her mom is getting worse regarding her condition and it was just too much to have her mom 24/7.  Now she can have a break from taking care of her mom on the days that the mom is with  the sister. Otherwise says that things have been stable and going well.  No specific concerns to address today. She is taking the Crestor daily. Myalgias or other adverse effects. She is taking the Zoloft daily and uses the Xanax if needed. The Zoloft is working well and is controlling her anxiety depression. He denies and it is an adjustment with her mom and she does feel that these are controlled. She is taking her diabetic medicines as directed. Is having no low blood sugars.   Past Medical History:  Diagnosis Date  . Anxiety   . Diabetes mellitus without complication ([place])   . Hyperlipidemia   . Hypertension   . Ulcerative colitis ([facility]) 05/05/2005     Home Meds:  Outpatient Prescriptions Prior to Visit  Medication Sig Dispense Refill  . ALPRAZolam (XANAX) 0.5 MG tablet Take 1 tablet (0.5 mg total) by mouth 2 (two) times  daily as needed for sleep.  60 tablet  2  . aspirin 81 MG tablet Take 81 mg by mouth daily.      . Coenzyme Q10 (COQ10 PO) Take 1 tablet by mouth daily.      . Multiple Vitamin (MULTIVITAMIN) tablet Take 1 tablet by mouth daily. [facility] +50      . ONE TOUCH ULTRA TEST test strip TEST SUGAR TWICE A DAY  50 each  12  . ONETOUCH DELICA LANCETS 30Z MISC 1 each by Other route 2 (two) times daily.  100 each  11  . CRESTOR 20 MG tablet TAKE 1 TABLET BY MOUTH EVERY DAY  30 tablet  0  . metFORMIN (GLUCOPHAGE) 1000 MG tablet TAKE 1 TABLET BY MOUTH TWICE A DAY  60 tablet  5  . sertraline (ZOLOFT) 100 MG tablet TAKE 1 TABLET EVERY DAY   30 tablet  5  . pioglitazone (ACTOS) 30 MG tablet Take 1 tablet (30 mg total) by mouth daily.  30 tablet  2   No facility-administered medications prior to visit.     Allergies:  Allergies  Allergen Reactions  . Doxycycline Shortness Of Breath    Social History   Socioeconomic History  . Marital status: Married    Spouse name: Not on file  . Number of children: Not on file  . Years of education: Not on file  . Highest education level: Not on file  Social Needs  . Financial resource strain: Not on file  . Food insecurity - worry: Not on file  . Food insecurity - inability: Not on file  . Transportation needs - medical: Not on file  . Transportation needs - non-medical: Not on file  Occupational History  . Not on file  Tobacco Use  . Smoking status: Never Smoker  . Smokeless tobacco: Never Used  Substance and Sexual Activity  . Alcohol use: No  . Drug use: No  . Sexual activity: Not on file  Other Topics Concern  . 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   She is married.   Her husband has lupus.   Her daughter was recently diagnosed with lupus.   Her mother has Parkinson's disease. Patient is involved with helping care for her.    No family history on file.   Review of  Systems:  See HPI for pertinent ROS. All other ROS negative.    Physical Exam: Blood pressure 120/72, pulse 72, temperature 98.3 F (36.8 C), temperature source Oral, resp. rate 14, height  5' 6"  (1.676 m), weight 106.3 kg (234 lb 6.4 oz), SpO2 98 %., Body mass index is 37.83 kg/m. General: Overweight white female .Appears in no acute distress. Neck: Supple. No thyromegaly. No carotid bruits. Lungs: Clear bilaterally to auscultation without wheezes, rales, or rhonchi. Breathing is unlabored. Heart: RRR with S1 S2. No murmurs, rubs, or gallops. Abdomen: Soft, non-tender, non-distended with normoactive bowel sounds. No hepatomegaly. No rebound/guarding. No obvious abdominal masses. Musculoskeletal:  Strength and tone normal for age. Extremities/Skin: Warm and dry. No clubbing or cyanosis. No edema. No rashes or suspicious lesions. Neuro: Alert and oriented X 3. Moves all extremities spontaneously. Gait is normal. CNII-XII grossly in tact. Psych:  Responds to questions appropriately with a normal affect. Diabetic foot exam: Inspection is normal. No deformities. No ulcerations or skin breakdown. Sensation is intact to touch and monofilament test bilaterally. She has trace bilateral dorsalis pedis and posterior tibial pulses.      ASSESSMENT AND PLAN:  56 y.o. year old female with    1. Diabetes mellitus type 2 with complications - Hemoglobin A1c  - Microalbumin, urine--done 05/25/14, 10/25/2015, 12/11/2016   She had diabetic eye exam  ADDENDUM----- added 01/24/2015--- received note from her ophthalmologist from exam 01/17/2015. Exam showed no diabetic retinopathy. Addendum Added:  I received office note from diabetic eye exam. Performed 02/24/2017. Revealed no retinopathy.  Foot exam is normal  She is on Aspirin 29m QD She is on statin. FLP excellent 02/2014 LDL 70s She is on no ACE inhibitor  secondary to low blood pressure. I have discussed again today the long-term benefits of renal  protection of being on an ACE inhibitor. Discussed trying a very small dose. She defers. Says that she does not want to take any medications that she does not absolutely have to.  Pneumovax 23 given here 08/2013.  12/11/2016: Check  A1c, MicroAlbumin 03/12/2017--Check A1C  2. Diabetic polyneuropathy associated with type 2 diabetes mellitus ([place]---Presrcibed: Wrote down the following instructions for her:    Day 1: take 1 at night time.    Day 2: take 1 twice daily.    Starting on day 3: take one 3 times daily - gabapentin (NEURONTIN) 300 MG capsule; Take 1 capsule (300 mg total) by mouth 3 (three) times daily.  Dispense: 90 capsule; Refill: 3 At OV 06/21/15-- says that she is no longer needing this medicine and is no longer having pain in her feet even off of medication.  3. Hyperlipidemia She is on Crestor See HPI. She really ONLY TAKES 1/2 TABLET. Lipid panel was excellent at check 10/152015. LFTs normal. She says that taking 1/2 of Crestor in combination with CoQ 10 is perfect for her.  05/29/2016: Taking med as above. Recheck lipid panel and LFT today to monitor. 09/11/2016--- lipids were at goal 1/18. LFTs were normal. Can wait to recheck these labs at next visit. 12/11/2016----On Crestor. Recheck FLP/LFT now. 03/12/2017--- continue Crestor.  Lipid panel was excellent 12/2016 and LFTs were normal.  4. BP: Blood pressure is on the low side. Therefore can not add ACE inhibitor. Discussed the renal protection of an ACE inhibitor given her diabetes. However she does not want to take any medications as she does absolutely does not have to take. She defers this prescription even after I discussed prescribing just a very low dose. 03/12/2017: Blood Pressure is well controlled at goal today.  5. Anxiety This is well controlled with Zoloft. Currently, she is rarely needing Xanax. Continue to use this  as needed. 03/12/2017:  stable/controlled with current medication. Taking Zoloft daily and  rarely uses Xanax.  6. Mammogram: She has had in the past year per patient report. Up-to-date. Sees Gyn routinely. 02/2014--Reports that she just recently had her annual GYN exam. Reports  GYN added HRT and says that this is controlling her hot flashes and that they are 100% better and she feels so much better now. 05/2014--Reports they removed IUD 03/12/2017: She states that she sees gynecologist routinely.  Has mammogram scheduled for next week.  Has her Pap smear performed by GYN Dr. Tressia Danas.  7. Colonoscopy: 2007. Repeat 10 years per patient. ---------- at [place] 06/21/15 discussed with her that colonoscopy is due this year. She says that she has already received a reminder / notification from Dr. Collene Mares and patient states that she will call and schedule follow-up there. At [place] 12/2016 asked patient if she ever had follow-up colonoscopy with Dr. Collene Mares. She says that she has not that that she had problems with the last one because she has ulcerative colitis. Is not want to do another colonoscopy right now. Says that her gynecologist has her do Hemoccult cards every year.  8. Immunizations: Influenza vaccine given here 02/16/2014, 01/31/2016, 03/12/2017 Tetanus:  Given 12/11/2016 Pneumonia vaccine:  Pneumovax 23 given here 08/2013. No further pneumonia vaccine until age 43 Shingrix---Discussed at [place] 12/11/2016----she is to contact her insurance and find out regarding coverage and cost then let us know if she wants this.    Follow up office visit 3 months, sooner if needed.   Signed, [address] [place], [place], [facility] 03/12/2017 8:35 AM

## 2017-03-13 ENCOUNTER — Telehealth: Payer: Self-pay | Admitting: Family Medicine

## 2017-03-13 ENCOUNTER — Other Ambulatory Visit: Payer: Self-pay

## 2017-03-13 DIAGNOSIS — R768 Other specified abnormal immunological findings in serum: Secondary | ICD-10-CM

## 2017-03-13 MED ORDER — BLOOD GLUCOSE MONITOR KIT: PACK | 0 refills | Status: DC

## 2017-03-13 NOTE — Telephone Encounter (Signed)
Pharmacy needed new rx for diabetic supplies

## 2017-03-13 NOTE — Telephone Encounter (Signed)
Pt made aware of Hep C positive lab result.  Referral to Inf Dis started.

## 2017-03-16 LAB — HEPATITIS C ANTIBODY
Hepatitis C Ab: REACTIVE — AB
SIGNAL TO CUT-OFF: 8.5 — AB (ref <1.00)

## 2017-03-16 LAB — HIV ANTIBODY (ROUTINE TESTING W REFLEX): HIV 1&2 Ab, 4th Generation: NONREACTIVE

## 2017-03-16 LAB — HCV RNA,QUANTITATIVE REAL TIME PCR
HCV QUANT LOG: NOT DETECTED {Log_IU}/mL
HCV RNA, PCR, QN: <15 IU/mL

## 2017-03-16 LAB — HEMOGLOBIN A1C
EAG (MMOL/L): 7.7 (calc)
HEMOGLOBIN A1C: 6.5 %{Hb} — AB (ref <5.7)
Mean Plasma Glucose: 140 (calc)

## 2017-03-17 ENCOUNTER — Ambulatory Visit
Admission: RE | Admit: 2017-03-17 | Discharge: 2017-03-17 | Disposition: A | Discharge status: Home / Self Care | Payer: BLUE CROSS/BLUE SHIELD | Source: Ambulatory Visit | Attending: Family Medicine | Admitting: Family Medicine

## 2017-03-17 DIAGNOSIS — Z1231 Encounter for screening mammogram for malignant neoplasm of breast: Secondary | ICD-10-CM

## 2017-03-31 ENCOUNTER — Encounter: Payer: Self-pay | Admitting: Family Medicine

## 2017-04-03 LAB — HM PAP SMEAR

## 2017-04-12 ENCOUNTER — Other Ambulatory Visit: Payer: Self-pay | Admitting: Physician Assistant

## 2017-05-03 ENCOUNTER — Other Ambulatory Visit: Payer: Self-pay | Admitting: Physician Assistant

## 2017-05-04 ENCOUNTER — Other Ambulatory Visit: Payer: Self-pay | Admitting: Physician Assistant

## 2017-05-04 DIAGNOSIS — E1165 Type 2 diabetes mellitus with hyperglycemia: Secondary | ICD-10-CM

## 2017-05-04 DIAGNOSIS — IMO0002 Reserved for concepts with insufficient information to code with codable children: Secondary | ICD-10-CM

## 2017-06-12 ENCOUNTER — Other Ambulatory Visit: Payer: Self-pay | Admitting: Physician Assistant

## 2017-06-12 NOTE — Telephone Encounter (Signed)
Refill appropriate 

## 2017-06-18 ENCOUNTER — Encounter: Payer: Self-pay | Admitting: Physician Assistant

## 2017-06-18 ENCOUNTER — Ambulatory Visit (INDEPENDENT_AMBULATORY_CARE_PROVIDER_SITE_OTHER): Payer: Commercial Managed Care - PPO | Admitting: Physician Assistant

## 2017-06-18 VITALS — BP 124/76 | HR 69 | Temp 98.0°F | Resp 14 | Ht 66.0 in | Wt 236.0 lb

## 2017-06-18 DIAGNOSIS — F419 Anxiety disorder, unspecified: Secondary | ICD-10-CM

## 2017-06-18 DIAGNOSIS — E118 Type 2 diabetes mellitus with unspecified complications: Secondary | ICD-10-CM | POA: Diagnosis not present

## 2017-06-18 DIAGNOSIS — E785 Hyperlipidemia, unspecified: Secondary | ICD-10-CM | POA: Diagnosis not present

## 2017-06-18 DIAGNOSIS — I10 Essential (primary) hypertension: Secondary | ICD-10-CM

## 2017-06-18 MED ORDER — ALPRAZOLAM 0.5 MG PO TABS: 0.5000 mg | ORAL_TABLET | Freq: Two times a day (BID) | ORAL | 2 refills | Status: DC | PRN

## 2017-06-18 NOTE — Progress Notes (Signed)
Patient ID: Kimberly Solis MRN: [id number], DOB: 18-Nov-1960, 57 y.o. Date of Encounter: @DATE @  Chief Complaint:  No chief complaint on file.   HPI: 57 y.o. year old white female  presents for routine followup office visit.  At her OV 02/03/13  she reported that her father recently passed away. She reported that he had been very ill for quite some time prior to his death. During that time she was just having to do what she could do to get through each day. Was having to eat whatever she could. Was having to help with transporting her mother back and forth to the hospital et [name]. She was not exercising. At 02/2014 OV she reports that she recently well and her mother to a donor tissue program at [facility]. Says it was very nice and they honored the people who have donated tissues and organs over the past year including her father.  Also at the 02/2013 visit she reported that her husband has lupus and that her daughter had just recently been diagnosed with lupus as well.   She says that she is used to working with seniors and being around seniors.   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  She says that her husband and daughter both see Dr. [name] for their Lupus.   At her OV 08/2013 she reported that her husband was having bilateral knee replacements by Dr. [name] the next day.  At f/u OV she reported that he did have the bilateral knee replacements. She says that he has done excellent. She says that rather than sending him somewhere for a rehabilitation/physical therapy, Dr. [name] allowed him to go home and for her to care for him. She says that he did excellent with this. She was able to cook healthy foods for him and keep him much more comfortable. Says that he is was just able to return to work this week. Says she is delighted to see that he is able to return to his work which  he enjoys and not be in pain.  At prior OV she told me that in the past they have already gone through their 401(k) money to pay for medical expenses. Says that now he recently had to change the mortgage so that they could figure out ways to pay for medical expenses now. Says that all of this has been extremely stressful in addition to fact that like this upcoming week she will need to be out of work to help care for him. As well her mom as well as his mom to keep him busy with them try to help care for them. Said that there was constant stress. However she says that she thinks feels that she is handling it pretty well. Today she seems to be passed the high stress and much better.   At [place] 08/1013 she reported that she recently had been having to take one Xanax every night. Says that otherwise her muscles are extremely tight and she is not sleeping well. Said the Xanax relaxes her muscles and also helps her get to sleep. She never takes it during the day and only takes it at night.  At OV--11/2013--reports that she has not needed her Xanax in over 3 weeks.  Says at this point she would like to just have them available if she does have a day of high stress  and feels uptight and having hard time to relax. However is not needing them on a routine basis like she did in the past.   At [place] says she thinks she has lost some weight since the last visit. says they have been eating lots of vegetables and eating very healthy.  Did check. At her visit 08/2013 weight was 220. Today 11/2013 weight 215. Weight down 5 pounds!!  At 08/2013 office visit-- did lab work-- revealed A1c up to 7.7. At that result note, I said to add Actos 30 mg daily. At [place] 11/2013  She said that she did not start the medicine yet because she was on his talk to me about long-term safety first.  At that 11/2013 OV we did discuss this and she was agreeable to take it if needed. However , her weight was down and we  rechecked A1c that day --told her  she did not have to add Actos. She never had to add Actos.   08/31/2014 OV--She says that she has been feeling good.  (Says that her mom has been having a lot of problems secondary to her Parkinson's). Pt states that she did not add the Actos that I recommended at the last lab 05/25/14. At that time A1c came back 7.2 and recommended to add Actos 15 mg. Today I asked her she was worried about that drug in particular that we have plenty of other options and we can add a different oral medication. She says that she was just concerned that her sugar would get too low. Says that if A1c comes back high again she is agreeable to add this and is fine with taking Actos--Has no concerns about that medication in particular--says was just concerned about BS getting too low.   08/31/2014--A1C was 7.3---At OV 12/07/2014--She reports that she DID add the Actos. Is taking Actos 68m 1 po QD.   She is taking Crestor -- however she does tell me that quite a while back she decreased the dose to taking one half of a pill. Says that she was having significant discomfort in her feet. She wasn't sure if the symptoms were from diabetic neuropathy or not. However she can cut the Crestor in half and says that the symptoms in her feet improved 90%. Says that she was on a half tablet for a long time prior to the labs in 08/2013.  05/25/14, 08/31/2014, 12/07/2014----states still cutting Crestor in half and taking CoQ10.  No symptoms and last FLP was on this dose and excellent.   12/07/2014:  She says that things have been good. Says that the only thing as the GYN added hormones mid and the and progesterone for hot flashes and this is caused weight gain. Says that she is eating the exact same thing she was before but feels like she is just blowing up. Otherwise has been feeling great. Is very excited. Her, her husband, her daughter and son-in[place]. Says that it has been many years since they  have gone on any vacation except for just things with family or [facility]  03/15/15: She is in very good spirits today. Says a new baby coming to the family. [place]  Says her Gyn Rxed Phenteramine but she wanted to aks me about it before she started it.  Says he is going through menopause. However because she had an IUD in for years she  has been having problems with bleeding. Says that for these reasons her gynecologist has increased the dose of her hormones. This is causing her to have weight gain and therefore GYN prescribed phentermine. Patient states that she is taking the Actos that have been added by me in the past. Is taking all other medications as directed/as listed. She reports that her feet hurt at the end of the day and some of her toes burn. Today I discussed medications for this and initially she was deferring medication but then was agreeable to go ahead and add medicine for this. At that visit--prescribed Neurontin.  06/21/2015: Patient states that she did take the Neurontin but she isn't even having to take it anymore. Says that her feet are not even hurting anymore. Says that her gynecologist still has her on hormones and "everything is better except weight gain". She says that she really doesn't think that she is eating any more than she used to in the past and really does not think that it is causing food cravings but feels like it "is just making her swell up and blow up and gain weight". Did not use any phentermine that the gynecologist prescribed. Reviewed the labs performed here at last visit 03/15/2015 A1c was 7.0 and I recommended her increase Actos to 45 mg. She says she did make that change and is taking the Actos 45 mg. No complaints or concerns today other than the weight gain. Says that they are going to have her mother move in with them. However says that it will be a transitional process as she has to get  her pottery materials from her mom's house and make a space for those --then work on making her mother.  10/25/2015; Says her husband had rotator cuff surgery. He is out of work for 3 months. He is helping with laundry, helping with housework and it is driving her crazy b/c he does everything different than she does.  Says she never took the phenteramine. Again, says not eating any different, not doing anything different, except for the hormones from Gyn. Did finally get building to put her ceramic supplies in so can prepare for mom to move in with them.  01/31/2016: Asks what her weight is today compared to last visit. Reviewed that her weight is actually down a few pounds. She says that she is so glad. Says that since these hormone issues with GYN she has really struggled with her weight. Says that she has been eating extremely healthy and making sure not to eat anything after 6:30 PM. Says that she has her annual physical with GYN next Friday. Says that she does need a refill on her Xanax. Says that she only uses it occasionally at night about once a week. Taking Diabetes meds as directed. No adv effects.  Taking statin. No myalgias or other adv effects.  Zoloft still working well. Not feeling anxious or depressed. No adv effects.  05/29/2016: Today she reports that even in the very cold temperatures they moved her pottery and have made that space and has been working on that project. Also constantly helping with her mother who has Parkinson's and caring for her. She reports that she has had sore area in the back of her right throat that started sometime in December. Says that in the beginning she could feel something that felt like a little skin tag that she Scratched off but still has some sore areas with redness around them. Right side of neck feels  a little bit sore as well. No other specific concerns to address today. Taking Diabetes meds as directed. No adv effects.  Taking statin. No  myalgias or other adv effects.  Zoloft still working well. Not feeling anxious or depressed. No adv effects.   09/11/2016: She reports that she has gotten her mother moved in with her. She also has gotten all of her pottery moved to her house. Says she feels like some burden has been released from her-- to know that her mom is safe and sound-- and living with her. Says that she was having a lot of problems with her right shoulder and right elbow and has had a cortisone shot to the shoulder and the elbow so wanted me to be aware this may have caused increase in blood sugar/A1c. Also did physical therapy for 1-1/2 months. Says that it is much better but still has issues with the right shoulder and will continue to do the stretches and exercises. She has continued to cut Crestor 20 mg in half but is asking if I can just reduce the dose to where she doesn't have to continue cutting pills so we'll do so. Also does want refill on Xanax just to have available to use if needed. Also sometimes uses this at night and this seems to help relax her tight muscles and relax her to sleep better than any other type of medicine. Taking Diabetes meds as directed. No adv effects.  Taking statin. No myalgias or other adv effects.  Zoloft still working well. Not feeling anxious or depressed. No adv effects.    12/11/2016: I asked how things are going now that she has her mom moved in and her pottery moved to her house. I thought she would finally feel settled and have some peace of mine. Instead, she says that her mom is about driving her crazy. Says her mom will ask the same question or say the same thing over and over. She says that once she gets something on her mind she will not let it go. They have been getting boxes of tomatoes from [place] and making spaghetti sauce. Has gone through many many boxes of tomatoes. Also her mom was in the habit of doing very small load of laundry but doing it every day. Would wash  the clothes from the day before and also would wash her sheets etc. Patient has been having to get her to realize that they are not continue that habit. Otherwise patient has been doing well. She is taking the Crestor daily. Myalgias or other adverse effects. She is taking the Zoloft daily and uses the Xanax if needed. The Zoloft is working well and is controlling her anxiety depression. He denies and it is an adjustment with her mom and she does feel that these are controlled. She is taking her diabetic medicines as directed. Is having no low blood sugars.  Addendum Added:  I received office note from diabetic eye exam. Performed 02/24/2017. Revealed no retinopathy.  03/12/2017: Today patient states that her mom stays with her 3 days a week and stays with her sister the other days of the week.  Says that this is working much better.  Says that her mom is getting worse regarding her condition and it was just too much to have her mom 24/7.  Now she can have a break from taking care of her mom on the days that the mom is with the sister. Otherwise says that things have been stable  and going well.  No specific concerns to address today. She is taking the Crestor daily. No myalgias or other adverse effects. She is taking the Zoloft daily and uses the Xanax if needed. The Zoloft is working well and is controlling her anxiety depression. He denies and it is an adjustment with her mom and she does feel that these are controlled. She is taking her diabetic medicines as directed. Is having no low blood sugars.   06/18/2017: Reports that things with her mom have been worse recently.  She had to go to the hospital around Christmas and is in the hospital now.  Says that she is in a behavioral health Hospital in [place].  Says that at 2 AM every night she would be wide awake doing things.  Recent example-- thought that there was a group of people there that she needed to be cooking a big meal for and it was 2  in the morning.  States that her emotions have been like a roller coaster.  Also have been having to deal with trying to find facilities that can take care of her mom and dealing with financial issues etc.  We had discussion that my family has been going through the same thing with my grandfather.  Needing a refill on her Xanax.  Says that she has to take a half at night and then her back relaxes and she is able to sleep. She is taking the Crestor daily. No myalgias or other adverse effects. She is taking the Zoloft daily and uses the Xanax if needed. The Zoloft is working well and is controlling her anxiety depression. He denies and it is an adjustment with her mom and she does feel that these are controlled. She is taking her diabetic medicines as directed. Is having no low blood sugars.    Past Medical History:  Diagnosis Date  . Anxiety   . Diabetes mellitus without complication ([place])   . Hyperlipidemia   . Hypertension   . Ulcerative colitis ([place]) 05/05/2005     Home Meds:  Outpatient Prescriptions Prior to Visit  Medication Sig Dispense Refill  . ALPRAZolam (XANAX) 0.5 MG tablet Take 1 tablet (0.5 mg total) by mouth 2 (two) times daily as needed for sleep.  60 tablet  2  . aspirin 81 MG tablet Take 81 mg by mouth daily.      . Coenzyme Q10 (COQ10 PO) Take 1 tablet by mouth daily.      . Multiple Vitamin (MULTIVITAMIN) tablet Take 1 tablet by mouth daily. [facility] Women[facility] +50      . ONE TOUCH ULTRA TEST test strip TEST SUGAR TWICE A DAY  50 each  12  . ONETOUCH DELICA LANCETS 42V MISC 1 each by Other route 2 (two) times daily.  100 each  11  . CRESTOR 20 MG tablet TAKE 1 TABLET BY MOUTH EVERY DAY  30 tablet  0  . metFORMIN (GLUCOPHAGE) 1000 MG tablet TAKE 1 TABLET BY MOUTH TWICE A DAY  60 tablet  5  . sertraline (ZOLOFT) 100 MG tablet TAKE 1 TABLET EVERY DAY   30 tablet  5  . pioglitazone (ACTOS) 30 MG tablet Take 1 tablet (30 mg total) by mouth daily.  30 tablet  2   No  facility-administered medications prior to visit.     Allergies:  Allergies  Allergen Reactions  . Doxycycline Shortness Of Breath    Social History   Socioeconomic History  . Marital status: Married  Spouse name: Not on file  . Number of children: Not on file  . Years of education: Not on file  . Highest education level: Not on file  Social Needs  . Financial resource strain: Not on file  . Food insecurity - worry: Not on file  . Food insecurity - inability: Not on file  . Transportation needs - medical: Not on file  . Transportation needs - non-medical: Not on file  Occupational History  . Not on file  Tobacco Use  . Smoking status: Never Smoker  . Smokeless tobacco: Never Used  Substance and Sexual Activity  . Alcohol use: No  . Drug use: No  . Sexual activity: Not on file  Other Topics Concern  . 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   She is married.   Her husband has lupus.   Her daughter was recently diagnosed with lupus.   Her mother has Parkinson's disease. Patient is involved with helping care for her.    History reviewed. No pertinent family history.   Review of Systems:  See HPI for pertinent ROS. All other ROS negative.    Physical Exam: Blood pressure 124/76, pulse 69, temperature 98 F (36.7 C), temperature source Oral, resp. rate 14, height 5' 6"  (1.676 m), weight 107 kg (236 lb), SpO2 99 %., There is no height or weight on file to calculate BMI. General: Overweight white female .Appears in no acute distress. Neck: Supple. No thyromegaly. No carotid bruits. Lungs: Clear bilaterally to auscultation without wheezes, rales, or rhonchi. Breathing is unlabored. Heart: RRR with S1 S2. No murmurs, rubs, or gallops. Abdomen: Soft, non-tender, non-distended with normoactive bowel sounds. No hepatomegaly. No rebound/guarding. No obvious abdominal masses. Musculoskeletal:  Strength and  tone normal for age. Extremities/Skin: Warm and dry. No clubbing or cyanosis. No edema. No rashes or suspicious lesions. Neuro: Alert and oriented X 3. Moves all extremities spontaneously. Gait is normal. CNII-XII grossly in tact. Psych:  Responds to questions appropriately with a normal affect. Diabetic foot exam: Inspection is normal. No deformities. No ulcerations or skin breakdown. Sensation is intact to touch and monofilament test bilaterally. She has trace bilateral dorsalis pedis and posterior tibial pulses.      ASSESSMENT AND PLAN:  57 y.o. year old female with    1. Diabetes mellitus type 2 with complications - Hemoglobin A1c  - Microalbumin, urine--done 05/25/14, 10/25/2015, 12/11/2016   She had diabetic eye exam  ADDENDUM----- added 01/24/2015--- received note from her ophthalmologist from exam 01/17/2015. Exam showed no diabetic retinopathy. Addendum Added:  I received office note from diabetic eye exam. Performed 02/24/2017. Revealed no retinopathy.  Foot exam is normal  She is on Aspirin 44m QD She is on statin. FLP excellent 02/2014 LDL 70s She is on no ACE inhibitor  secondary to low blood pressure. I have discussed again today the long-term benefits of renal protection of being on an ACE inhibitor. Discussed trying a very small dose. She defers. Says that she does not want to take any medications that she does not absolutely have to.  Pneumovax 23 given here 08/2013.  12/11/2016: Check  A1c, MicroAlbumin 03/12/2017--Check A1C  2. Diabetic polyneuropathy associated with type 2 diabetes mellitus [place]---Presrcibed: Wrote down the following instructions for her:    Day 1: take 1 at night time.    Day 2: take 1 twice daily.    Starting on day 3: take  one 3 times daily - gabapentin (NEURONTIN) 300 MG capsule; Take 1 capsule (300 mg total) by mouth 3 (three) times daily.  Dispense: 90 capsule; Refill: 3 At OV 06/21/15-- says that she is no longer needing this  medicine and is no longer having pain in her feet even off of medication.  3. Hyperlipidemia She is on Crestor See HPI. She really ONLY TAKES 1/2 TABLET. Lipid panel was excellent at check 10/152015. LFTs normal. She says that taking 1/2 of Crestor in combination with CoQ 10 is perfect for her.  05/29/2016: Taking med as above. Recheck lipid panel and LFT today to monitor. 09/11/2016--- lipids were at goal 1/18. LFTs were normal. Can wait to recheck these labs at next visit. 12/11/2016----On Crestor. Recheck FLP/LFT now. 03/12/2017--- continue Crestor.  Lipid panel was excellent 12/2016 and LFTs were normal. 06/18/2017: She is fasting.  Recheck FLP/LFT to monitor.  4. BP: Blood pressure is on the low side. Therefore can not add ACE inhibitor. Discussed the renal protection of an ACE inhibitor given her diabetes. However she does not want to take any medications as she does absolutely does not have to take. She defers this prescription even after I discussed prescribing just a very low dose. 06/18/2017:: Blood Pressure is well controlled at goal today.  5. Anxiety This is well controlled with Zoloft. Currently, she is rarely needing Xanax. Continue to use this as needed. 06/18/2017::  stable/controlled with current medication. Taking Zoloft daily and rarely uses Xanax.  Is now taking one half Xanax at night so that she can relax and sleep.  See HPI for detail from today.  6. Mammogram: She has had in the past year per patient report. Up-to-date. Sees Gyn routinely. 02/2014--Reports that she just recently had her annual GYN exam. Reports  GYN added HRT and says that this is controlling her hot flashes and that they are 100% better and she feels so much better now. 05/2014--Reports they removed IUD 03/12/2017: She states that she sees gynecologist routinely.  Has mammogram scheduled for next week.  Has her Pap smear performed by GYN Dr. Tressia Danas.  7. Colonoscopy: 2007. Repeat 10 years per  patient. ---------- at [place] 06/21/15 discussed with her that colonoscopy is due this year. She says that she has already received a reminder / notification from Dr. Collene Mares and patient states that she will call and schedule follow-up there. At [place] 12/2016 asked patient if she ever had follow-up colonoscopy with Dr. Collene Mares. She says that she has not that that she had problems with the last one because she has ulcerative colitis. Is not want to do another colonoscopy right now. Says that her gynecologist has her do Hemoccult cards every year.  8. Immunizations: Influenza vaccine given here 02/16/2014, 01/31/2016, 03/12/2017 Tetanus:  Given 12/11/2016 Pneumonia vaccine:  Pneumovax 23 given here 08/2013. No further pneumonia vaccine until age 39 Shingrix---Discussed at [place] 12/11/2016----she is to contact her insurance and find out regarding coverage and cost then let us know if she wants this.    Follow up office visit 3 months, sooner if needed.   Signed, [address] [place], [place], [facility] 06/18/2017 8:04 AM

## 2017-06-19 LAB — COMPLETE METABOLIC PANEL WITH GFR
AG Ratio: 1.8 (calc) (ref 1.0–2.5)
ALT: 14 U/L (ref 6–29)
AST: 16 U/L (ref 10–35)
Albumin: 4.5 g/dL (ref 3.6–5.1)
Alkaline phosphatase (APISO): 68 U/L (ref 33–130)
BILIRUBIN TOTAL: 0.2 mg/dL (ref 0.2–1.2)
BUN: 14 mg/dL (ref 7–25)
CALCIUM: 9.9 mg/dL (ref 8.6–10.4)
CHLORIDE: 105 mmol/L (ref 98–110)
CO2: 27 mmol/L (ref 20–32)
Creat: 0.84 mg/dL (ref 0.50–1.05)
GFR, EST AFRICAN AMERICAN: 91 mL/min/{1.73_m2} (ref >=60)
GFR, EST NON AFRICAN AMERICAN: 78 mL/min/{1.73_m2} (ref >=60)
GLOBULIN: 2.5 g/dL (ref 1.9–3.7)
Glucose, Bld: 111 mg/dL — ABNORMAL HIGH (ref 65–99)
POTASSIUM: 5 mmol/L (ref 3.5–5.3)
SODIUM: 141 mmol/L (ref 135–146)
Total Protein: 7 g/dL (ref 6.1–8.1)

## 2017-06-19 LAB — LIPID PANEL
CHOL/HDL RATIO: 3.2 (calc) (ref <5.0)
Cholesterol: 182 mg/dL (ref <200)
HDL: 57 mg/dL (ref >50)
LDL CHOLESTEROL (CALC): 98 mg/dL
NON-HDL CHOLESTEROL (CALC): 125 mg/dL (ref <130)
TRIGLYCERIDES: 163 mg/dL — AB (ref <150)

## 2017-06-19 LAB — HEMOGLOBIN A1C
Hgb A1c MFr Bld: 6.6 % of total Hgb — ABNORMAL HIGH (ref <5.7)
Mean Plasma Glucose: 143 (calc)
eAG (mmol/L): 7.9 (calc)

## 2017-07-02 ENCOUNTER — Other Ambulatory Visit: Payer: Self-pay | Admitting: Physician Assistant

## 2017-07-02 NOTE — Telephone Encounter (Signed)
Refill appropriate 

## 2017-07-14 ENCOUNTER — Other Ambulatory Visit: Payer: Self-pay | Admitting: Physician Assistant

## 2017-07-14 NOTE — Telephone Encounter (Signed)
Refill appropriate 

## 2017-08-09 ENCOUNTER — Other Ambulatory Visit: Payer: Self-pay | Admitting: Physician Assistant

## 2017-09-04 ENCOUNTER — Other Ambulatory Visit: Payer: Self-pay | Admitting: Physician Assistant

## 2017-09-17 ENCOUNTER — Encounter: Payer: Self-pay | Admitting: Physician Assistant

## 2017-09-17 ENCOUNTER — Other Ambulatory Visit: Payer: Self-pay

## 2017-09-17 ENCOUNTER — Ambulatory Visit (INDEPENDENT_AMBULATORY_CARE_PROVIDER_SITE_OTHER): Payer: Commercial Managed Care - PPO | Admitting: Physician Assistant

## 2017-09-17 VITALS — BP 128/70 | HR 71 | Temp 98.1°F | Resp 16 | Ht 66.0 in | Wt 237.2 lb

## 2017-09-17 DIAGNOSIS — E785 Hyperlipidemia, unspecified: Secondary | ICD-10-CM

## 2017-09-17 DIAGNOSIS — I10 Essential (primary) hypertension: Secondary | ICD-10-CM

## 2017-09-17 DIAGNOSIS — R109 Unspecified abdominal pain: Secondary | ICD-10-CM | POA: Diagnosis not present

## 2017-09-17 DIAGNOSIS — E118 Type 2 diabetes mellitus with unspecified complications: Secondary | ICD-10-CM | POA: Diagnosis not present

## 2017-09-17 DIAGNOSIS — F419 Anxiety disorder, unspecified: Secondary | ICD-10-CM | POA: Diagnosis not present

## 2017-09-17 LAB — MICROSCOPIC MESSAGE

## 2017-09-17 LAB — URINALYSIS, ROUTINE W REFLEX MICROSCOPIC
Bilirubin Urine: NEGATIVE
Glucose, UA: NEGATIVE
Hgb urine dipstick: NEGATIVE
Hyaline Cast: NONE SEEN /LPF
Ketones, ur: NEGATIVE
Nitrite: NEGATIVE
Protein, ur: NEGATIVE
Specific Gravity, Urine: 1.025 (ref 1.001–1.03)
pH: 5.5 (ref 5.0–8.0)

## 2017-09-17 NOTE — Progress Notes (Signed)
Patient ID: Kimberly Solis MRN: [id number], DOB: 1960/09/16, 57 y.o. Date of Encounter: @DATE @  Chief Complaint:  Chief Complaint  Patient presents with  . discuss blood pressure  . discuss diabetes    HPI: 57 y.o. year old white female  presents for routine followup office visit.  At her OV 02/03/13  she reported that her father recently passed away. She reported that he had been very ill for quite some time prior to his death. During that time she was just having to do what she could do to get through each day. Was having to eat whatever she could. Was having to help with transporting her mother back and forth to the hospital et [name]. She was not exercising. At 02/2014 OV she reports that she recently well and her mother to a donor tissue program at [facility]. Says it was very nice and they honored the people who have donated tissues and organs over the past year including her father.  Also at the 02/2013 visit she reported that her husband has lupus and that her daughter had just recently been diagnosed with lupus as well.   She says that she is used to working with seniors and being around seniors.   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  She says that her husband and daughter both see Dr. [name] for their Lupus.   At her OV 08/2013 she reported that her husband was having bilateral knee replacements by Dr. [name] the next day.  At f/u OV she reported that he did have the bilateral knee replacements. She says that he has done excellent. She says that rather than sending him somewhere for a rehabilitation/physical therapy, Dr. [name] allowed him to go home and for her to care for him. She says that he did excellent with this. She was able to cook healthy foods for him and keep him much more comfortable. Says that he is was just able to return to work this week. Says she is  delighted to see that he is able to return to his work which he enjoys and not be in pain.  At prior OV she told me that in the past they have already gone through their 401(k) money to pay for medical expenses. Says that now he recently had to change the mortgage so that they could figure out ways to pay for medical expenses now. Says that all of this has been extremely stressful in addition to fact that like this upcoming week she will need to be out of work to help care for him. As well her mom as well as his mom to keep him busy with them try to help care for them. Said that there was constant stress. However she says that she thinks feels that she is handling it pretty well. Today she seems to be passed the high stress and much better.   At [place] 08/1013 she reported that she recently had been having to take one Xanax every night. Says that otherwise her muscles are extremely tight and she is not sleeping well. Said the Xanax relaxes her muscles and also helps her get to sleep. She never takes it during the day and only takes it at night.  At OV--11/2013--reports that she has not needed her Xanax in over 3 weeks.  Says at this point she would like to just have  them available if she does have a day of high stress and feels uptight and having hard time to relax. However is not needing them on a routine basis like she did in the past.   At [place] says she thinks she has lost some weight since the last visit. says they have been eating lots of vegetables and eating very healthy.  Did check. At her visit 08/2013 weight was 220. Today 11/2013 weight 215. Weight down 5 pounds!!  At 08/2013 office visit-- did lab work-- revealed A1c up to 7.7. At that result note, I said to add Actos 30 mg daily. At [place] 11/2013  She said that she did not start the medicine yet because she was on his talk to me about long-term safety first.  At that 11/2013 OV we did discuss this and she was agreeable to take it if needed. However ,  her weight was down and we  rechecked A1c that day --told her she did not have to add Actos. She never had to add Actos.   08/31/2014 OV--She says that she has been feeling good.  (Says that her mom has been having a lot of problems secondary to her Parkinson's). Pt states that she did not add the Actos that I recommended at the last lab 05/25/14. At that time A1c came back 7.2 and recommended to add Actos 15 mg. Today I asked her she was worried about that drug in particular that we have plenty of other options and we can add a different oral medication. She says that she was just concerned that her sugar would get too low. Says that if A1c comes back high again she is agreeable to add this and is fine with taking Actos--Has no concerns about that medication in particular--says was just concerned about BS getting too low.   08/31/2014--A1C was 7.3---At OV 12/07/2014--She reports that she DID add the Actos. Is taking Actos 62m 1 po QD.   She is taking Crestor -- however she does tell me that quite a while back she decreased the dose to taking one half of a pill. Says that she was having significant discomfort in her feet. She wasn't sure if the symptoms were from diabetic neuropathy or not. However she can cut the Crestor in half and says that the symptoms in her feet improved 90%. Says that she was on a half tablet for a long time prior to the labs in 08/2013.  05/25/14, 08/31/2014, 12/07/2014----states still cutting Crestor in half and taking CoQ10.  No symptoms and last FLP was on this dose and excellent.   12/07/2014:  She says that things have been good. Says that the only thing as the GYN added hormones mid and the and progesterone for hot flashes and this is caused weight gain. Says that she is eating the exact same thing she was before but feels like she is just blowing up. Otherwise has been feeling great. Is very excited. Her, her husband, her daughter and son[place]in[place] Says that it has been many years since they have gone on any vacation except for just things with family [facility]  03/[facility]/15: She is in very good spirits today. Says a new baby coming to the family. [place]  Says her Gyn Rxed Phenteramine but she wanted to aks me about it before she started it.  Says he is going through  menopause. However because she had an IUD in for years she has been having problems with bleeding. Says that for these reasons her gynecologist has increased the dose of her hormones. This is causing her to have weight gain and therefore GYN prescribed phentermine. Patient states that she is taking the Actos that have been added by me in the past. Is taking all other medications as directed/as listed. She reports that her feet hurt at the end of the day and some of her toes burn. Today I discussed medications for this and initially she was deferring medication but then was agreeable to go ahead and add medicine for this. At that visit--prescribed Neurontin.  06/21/2015: Patient states that she did take the Neurontin but she isn't even having to take it anymore. Says that her feet are not even hurting anymore. Says that her gynecologist still has her on hormones and "everything is better except weight gain". She says that she really doesn't think that she is eating any more than she used to in the past and really does not think that it is causing food cravings but feels like it "is just making her swell up and blow up and gain weight". Did not use any phentermine that the gynecologist prescribed. Reviewed the labs performed here at last visit 03/15/2015 A1c was 7.0 and I recommended her increase Actos to 45 mg. She says she did make that change and is taking the Actos 45 mg. No complaints or concerns today other than the weight gain. Says that they are going to have her mother move in with them.  However says that it will be a transitional process as she has to get her pottery materials from her mom's house and make a space for those --then work on making her mother.  10/25/2015; Says her husband had rotator cuff surgery. He is out of work for 3 months. He is helping with laundry, helping with housework and it is driving her crazy b/c he does everything different than she does.  Says she never took the phenteramine. Again, says not eating any different, not doing anything different, except for the hormones from Gyn. Did finally get building to put her ceramic supplies in so can prepare for mom to move in with them.  01/31/2016: Asks what her weight is today compared to last visit. Reviewed that her weight is actually down a few pounds. She says that she is so glad. Says that since these hormone issues with GYN she has really struggled with her weight. Says that she has been eating extremely healthy and making sure not to eat anything after 6:30 PM. Says that she has her annual physical with GYN next Friday. Says that she does need a refill on her Xanax. Says that she only uses it occasionally at night about once a week. Taking Diabetes meds as directed. No adv effects.  Taking statin. No myalgias or other adv effects.  Zoloft still working well. Not feeling anxious or depressed. No adv effects.  05/29/2016: Today she reports that even in the very cold temperatures they moved her pottery and have made that space and has been working on that project. Also constantly helping with her mother who has Parkinson's and caring for her. She reports that she has had sore area in the back of her right throat that started sometime in December. Says that in the beginning she could feel something that felt like a little skin tag that she Scratched off but still has some  sore areas with redness around them. Right side of neck feels a little bit sore as well. No other specific concerns to address  today. Taking Diabetes meds as directed. No adv effects.  Taking statin. No myalgias or other adv effects.  Zoloft still working well. Not feeling anxious or depressed. No adv effects.   09/11/2016: She reports that she has gotten her mother moved in with her. She also has gotten all of her pottery moved to her house. Says she feels like some burden has been released from her-- to know that her mom is safe and sound-- and living with her. Says that she was having a lot of problems with her right shoulder and right elbow and has had a cortisone shot to the shoulder and the elbow so wanted me to be aware this may have caused increase in blood sugar/A1c. Also did physical therapy for 1-1/2 months. Says that it is much better but still has issues with the right shoulder and will continue to do the stretches and exercises. She has continued to cut Crestor 20 mg in half but is asking if I can just reduce the dose to where she doesn't have to continue cutting pills so we'll do so. Also does want refill on Xanax just to have available to use if needed. Also sometimes uses this at night and this seems to help relax her tight muscles and relax her to sleep better than any other type of medicine. Taking Diabetes meds as directed. No adv effects.  Taking statin. No myalgias or other adv effects.  Zoloft still working well. Not feeling anxious or depressed. No adv effects.    12/11/2016: I asked how things are going now that she has her mom moved in and her pottery moved to her house. I thought she would finally feel settled and have some peace of mine. Instead, she says that her mom is about driving her crazy. Says her mom will ask the same question or say the same thing over and over. She says that once she gets something on her mind she will not let it go. They have been getting boxes of tomatoes from [place] and making spaghetti sauce. Has gone through many many boxes of tomatoes. Also her mom was in the  habit of doing very small load of laundry but doing it every day. Would wash the clothes from the day before and also would wash her sheets etc. Patient has been having to get her to realize that they are not continue that habit. Otherwise patient has been doing well. She is taking the Crestor daily. Myalgias or other adverse effects. She is taking the Zoloft daily and uses the Xanax if needed. The Zoloft is working well and is controlling her anxiety depression. He denies and it is an adjustment with her mom and she does feel that these are controlled. She is taking her diabetic medicines as directed. Is having no low blood sugars.  Addendum Added:  I received office note from diabetic eye exam. Performed 02/24/2017. Revealed no retinopathy.  03/12/2017: Today patient states that her mom stays with her 3 days a week and stays with her sister the other days of the week.  Says that this is working much better.  Says that her mom is getting worse regarding her condition and it was just too much to have her mom 24/7.  Now she can have a break from taking care of her mom on the days that the mom  is with the sister. Otherwise says that things have been stable and going well.  No specific concerns to address today. She is taking the Crestor daily. No myalgias or other adverse effects. She is taking the Zoloft daily and uses the Xanax if needed. The Zoloft is working well and is controlling her anxiety depression. He denies and it is an adjustment with her mom and she does feel that these are controlled. She is taking her diabetic medicines as directed. Is having no low blood sugars.   06/18/2017: Reports that things with her mom have been worse recently.  She had to go to the hospital around Christmas and is in the hospital now.  Says that she is in a behavioral health Hospital in [place].  Says that at 2 AM every night she would be wide awake doing things.  Recent example-- thought that there was a  group of people there that she needed to be cooking a big meal for and it was 2 in the morning.  States that her emotions have been like a roller coaster.  Also have been having to deal with trying to find facilities that can take care of her mom and dealing with financial issues etc.  We had discussion that my family has been going through the same thing with my grandfather.  Needing a refill on her Xanax.  Says that she has to take a half at night and then her back relaxes and she is able to sleep. She is taking the Crestor daily. No myalgias or other adverse effects. She is taking the Zoloft daily and uses the Xanax if needed. The Zoloft is working well and is controlling her anxiety depression. He denies and it is an adjustment with her mom and she does feel that these are controlled. She is taking her diabetic medicines as directed. Is having no low blood sugars.   09/17/2017: Today she reports that her mother actually passed away on Mother's Day.  States that she feels very at peace with all of this.  States that her mother had gone from the hospital to her sister's house and was at her sister's house when she passed away.  Says that the sister had felt that the medications they had the mother on were making her worse and got her home and got her off of some of those medicines and that she had been up walking around doing great the day before she passed away.  Also names off multiple family members who had time to come and spend time with her mom before she passed.  She says they weill have a very private, intimate "service". She talks about the fact that they had her father cremated and they are going to do the same with her mother and what she is going to do with those ashes.  They are distributing them sharing them among family members but for her part she has seen a beautiful way to use them in a display.  Says that her sister has already said that she wants to buy the mom's house and that her  daughter is already renting and living there.  She seems very at peace with everything. She states that she does hope to start to focus on her own health now that she will not have these other distractions and issues to do deal with. Otherwise has no specific concerns to address and has been feeling stable in regards to her own health. Continues to take the Crestor daily  and is having no myalgias or other adverse effects. She is taking the diabetic medications as directed and these are causing no adverse effects. The Zoloft is working well at keeping her anxiety and depression controlled and the medicine is causing no adverse effects. She reports that recently she has noticed a mild pressure in her low mid abdomen.  Says that while she is here she just wants to check to make sure she is not starting a UTI.  States that if this is normal then the abdominal pain has been extremely mild and not anything real concerning but just wanted to check for a UTI while she is here.  Otherwise we will continue to monitor symptoms and follow-up if symptoms worsen.   Past Medical History:  Diagnosis Date  . Anxiety   . Diabetes mellitus without complication ([place])   . Hyperlipidemia   . Hypertension   . Ulcerative colitis ([place]) 05/05/2005     Home Meds:  Outpatient Prescriptions Prior to Visit  Medication Sig Dispense Refill  . ALPRAZolam (XANAX) 0.5 MG tablet Take 1 tablet (0.5 mg total) by mouth 2 (two) times daily as needed for sleep.  60 tablet  2  . aspirin 81 MG tablet Take 81 mg by mouth daily.      . Coenzyme Q10 (COQ10 PO) Take 1 tablet by mouth daily.      . Multiple Vitamin (MULTIVITAMIN) tablet Take 1 tablet by mouth daily. [facility] +50      . ONE TOUCH ULTRA TEST test strip TEST SUGAR TWICE A DAY  50 each  12  . ONETOUCH DELICA LANCETS 94R MISC 1 each by Other route 2 (two) times daily.  100 each  11  . CRESTOR 20 MG tablet TAKE 1 TABLET BY MOUTH EVERY DAY  30 tablet  0  . metFORMIN  (GLUCOPHAGE) 1000 MG tablet TAKE 1 TABLET BY MOUTH TWICE A DAY  60 tablet  5  . sertraline (ZOLOFT) 100 MG tablet TAKE 1 TABLET EVERY DAY   30 tablet  5  . pioglitazone (ACTOS) 30 MG tablet Take 1 tablet (30 mg total) by mouth daily.  30 tablet  2   No facility-administered medications prior to visit.     Allergies:  Allergies  Allergen Reactions  . Doxycycline Shortness Of Breath    Social History   Socioeconomic History  . Marital status: Married    Spouse name: Not on file  . Number of children: Not on file  . Years of education: Not on file  . Highest education level: Not on file  Occupational History  . Not on file  Social Needs  . Financial resource strain: Not on file  . Food insecurity:    Worry: Not on file    Inability: Not on file  . Transportation needs:    Medical: Not on file    Non-medical: Not on file  Tobacco Use  . Smoking status: Never Smoker  . Smokeless tobacco: Never Used  Substance and Sexual Activity  . Alcohol use: No  . Drug use: No  . Sexual activity: Not on file  Lifestyle  . Physical activity:    Days per week: Not on file    Minutes per session: Not on file  . Stress: Not on file  Relationships  . Social connections:    Talks on phone: Not on file    Gets together: Not on file    Attends religious service: Not on file    Active  member of club or organization: Not on file    Attends meetings of clubs or organizations: Not on file    Relationship status: Not on file  . Intimate partner violence:    Fear of current or ex partner: Not on file    Emotionally abused: Not on file    Physically abused: Not on file    Forced sexual activity: Not on file  Other Topics Concern  . 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   She is married.   Her husband has lupus.   Her daughter was recently diagnosed with lupus.   Her mother has Parkinson's disease. Patient is involved  with helping care for her.    History reviewed. No pertinent family history.   Review of Systems:  See HPI for pertinent ROS. All other ROS negative.    Physical Exam: Blood pressure 128/70, pulse 71, temperature 98.1 F (36.7 C), temperature source Oral, resp. rate 16, height 5' 6"  (1.676 m), weight 107.6 kg (237 lb 3.2 oz), SpO2 100 %., Body mass index is 38.29 kg/m. General: WF. Appears in no acute distress. Neck: Supple. No thyromegaly. No lymphadenopathy.No carotid bruits.  Lungs: Clear bilaterally to auscultation without wheezes, rales, or rhonchi. Breathing is unlabored. Heart: RRR with S1 S2. No murmurs, rubs, or gallops. Abdomen: Soft, non-tender, non-distended with normoactive bowel sounds. No hepatomegaly. No rebound/guarding. No obvious abdominal masses. Musculoskeletal:  Strength and tone normal for age. Extremities/Skin: Warm and dry. No LE edema.  Neuro: Alert and oriented X 3. Moves all extremities spontaneously. Gait is normal. CNII-XII grossly in tact. Psych:  Responds to questions appropriately with a normal affect.   Diabetic foot exam: Inspection is normal. No deformities. No ulcerations or skin breakdown. Sensation is intact to touch and monofilament test bilaterally. She has trace bilateral dorsalis pedis and posterior tibial pulses.      ASSESSMENT AND PLAN:  57 y.o. year old female with    Abdominal pressure 09/17/2017: See HPI for details.  Will check UA.  If this shows UTI will treat accordingly.  If this is negative then she will simply monitor and will follow-up if symptoms worsen. - Urinalysis, Routine w reflex microscopic - Urine Culture    Diabetes mellitus type 2 with complications  - Microalbumin, urine--done 05/25/14, 10/25/2015, 12/11/2016   She had diabetic eye exam  ADDENDUM----- added 01/24/2015--- received note from her ophthalmologist from exam 01/17/2015. Exam showed no diabetic retinopathy. Addendum Added:  I received office note from  diabetic eye exam. Performed 02/24/2017. Revealed no retinopathy.  Foot exam is normal  She is on Aspirin 48m QD She is on statin.  She is on no ACE inhibitor  secondary to low blood pressure. I have discussed again today the long-term benefits of renal protection of being on an ACE inhibitor. Discussed trying a very small dose. She defers. Says that she does not want to take any medications that she does not absolutely have to.  Pneumovax 23 given here 08/2013.  12/11/2016: Check  A1c, MicroAlbumin 03/12/2017--Check A1C 09/17/2017: Recheck A1C  Diabetic polyneuropathy associated with type 2 diabetes mellitus ([place]2016---Presrcibed: Wrote down the following instructions for her:    Day 1: take 1 at night time.    Day 2: take 1 twice daily.    Starting on day 3: take one 3 times daily - gabapentin (NEURONTIN) 300 MG capsule; Take 1 capsule (300 mg total) by  mouth 3 (three) times daily.  Dispense: 90 capsule; Refill: 3 At OV 06/21/15-- says that she is no longer needing this medicine and is no longer having pain in her feet even off of medication.  Hyperlipidemia She is on Crestor See HPI. She really ONLY TAKES 1/2 TABLET. Lipid panel was excellent at check 10/152015. LFTs normal. She says that taking 1/2 of Crestor in combination with CoQ 10 is perfect for her.  05/29/2016: Taking med as above. Recheck lipid panel and LFT today to monitor. 09/11/2016--- lipids were at goal 1/18. LFTs were normal. Can wait to recheck these labs at next visit. 12/11/2016----On Crestor. Recheck FLP/LFT now. 03/12/2017--- continue Crestor.  Lipid panel was excellent 12/2016 and LFTs were normal. 06/18/2017: She is fasting.  Recheck FLP/LFT to monitor. 09/17/2017: She had [place] 06/18/2017.  LDL 98.  Continue current dose of Crestor.  BP: Blood pressure is on the low side. Therefore can not add ACE inhibitor. Discussed the renal protection of an ACE inhibitor given her diabetes. However she does not want to take  any medications as she does absolutely does not have to take. She defers this prescription even after I discussed prescribing just a very low dose. 09/17/2017:  Blood Pressure is well controlled/ at goal today.  Anxiety This is well controlled with Zoloft. Currently, she is rarely needing Xanax. Continue to use this as needed. 06/18/2017::  stable/controlled with current medication. Taking Zoloft daily and rarely uses Xanax.  Is now taking one half Xanax at night so that she can relax and sleep.  See HPI for detail from today. 09/17/2017: Stable/controlled with current medications.  Taking Zoloft.  Using Xanax as needed.  Mammogram: She has had in the past year per patient report. Up-to-date. Sees Gyn routinely. 02/2014--Reports that she just recently had her annual GYN exam. Reports  GYN added HRT and says that this is controlling her hot flashes and that they are 100% better and she feels so much better now. 05/2014--Reports they removed IUD 03/12/2017: She states that she sees gynecologist routinely.  Has mammogram scheduled for next week.  Has her Pap smear performed by GYN Dr. Tressia Danas.  7. Colonoscopy: 2007. Repeat 10 years per patient. ---------- at [facility] 06/21/15 discussed with her that colonoscopy is due this year. She says that she has already received a reminder / notification from Dr. Collene Mares and patient states that she will call and schedule follow-up there. At [facility] 12/2016 asked patient if she ever had follow-up colonoscopy with Dr. Collene Mares. She says that she has not that that she had problems with the last one because she has ulcerative colitis. Is not want to do another colonoscopy right now. Says that her gynecologist has her do Hemoccult cards every year.  8. Immunizations: Influenza vaccine given here 02/16/2014, 01/31/2016, 03/12/2017 Tetanus:  Given 12/11/2016 Pneumonia vaccine:  Pneumovax 23 given here 08/2013. No further pneumonia vaccine until age 56 Shingrix---Discussed at [facility] 12/11/2016----she  is to contact her insurance and find out regarding coverage and cost then let us know if she wants this.    Follow up office visit 3 months, sooner if needed.   Signed, [id number] [place], [place], [facility] 09/17/2017 8:08 AM

## 2017-09-18 LAB — HEMOGLOBIN A1C
EAG (MMOL/L): 7.9 (calc)
Hgb A1c MFr Bld: 6.6 % of total Hgb — ABNORMAL HIGH (ref <5.7)
MEAN PLASMA GLUCOSE: 143 (calc)

## 2017-09-18 LAB — URINE CULTURE
MICRO NUMBER:: 90597961
SPECIMEN QUALITY: ADEQUATE

## 2017-10-01 ENCOUNTER — Other Ambulatory Visit: Payer: Self-pay | Admitting: Physician Assistant

## 2017-10-06 ENCOUNTER — Other Ambulatory Visit: Payer: Self-pay | Admitting: Physician Assistant

## 2017-10-26 ENCOUNTER — Ambulatory Visit (INDEPENDENT_AMBULATORY_CARE_PROVIDER_SITE_OTHER): Payer: Commercial Managed Care - PPO

## 2017-10-26 ENCOUNTER — Ambulatory Visit (INDEPENDENT_AMBULATORY_CARE_PROVIDER_SITE_OTHER): Payer: Commercial Managed Care - PPO | Admitting: Physician Assistant

## 2017-10-26 ENCOUNTER — Encounter (INDEPENDENT_AMBULATORY_CARE_PROVIDER_SITE_OTHER): Payer: Self-pay | Admitting: Physician Assistant

## 2017-10-26 DIAGNOSIS — M542 Cervicalgia: Secondary | ICD-10-CM | POA: Diagnosis not present

## 2017-10-26 DIAGNOSIS — M25522 Pain in left elbow: Secondary | ICD-10-CM | POA: Diagnosis not present

## 2017-10-26 MED ORDER — METHYLPREDNISOLONE 4 MG PO TABS: ORAL_TABLET | ORAL | 0 refills | Status: DC

## 2017-10-26 MED ORDER — METHOCARBAMOL 500 MG PO TABS: 500.0000 mg | ORAL_TABLET | Freq: Three times a day (TID) | ORAL | 1 refills | Status: DC

## 2017-10-26 NOTE — Progress Notes (Signed)
Office Visit Note   Patient: Kimberly Solis           Date of Birth: 1961/02/21           MRN: [id number] Visit Date: 10/26/2017              Requested by: Kimberly Sheldon, PA-C [address] [place], [place] [address] PCP: Kimberly Sheldon, PA-C   Assessment & Plan: Visit Diagnoses:  1. Pain in left elbow   2. Cervicalgia     Plan: Place her on a Medrol Dosepak she is to monitor her glucose levels closely.  Also she is given Robaxin.  She will start performing neck exercises as reviewed with her and handouts given.  Moist heat to the neck.  We will see her back in 2 weeks check her progress lack of.  When she comes off the Medrol Dosepak I would like for her to go on Advil and Tylenol.  Questions encouraged and answered at length.  Follow-Up Instructions: Return in about 2 weeks (around 11/09/2017).   Orders:  Orders Placed This Encounter  Procedures  . XR Elbow Complete Left (3+View)  . XR Cervical Spine 2 or 3 views   Meds ordered this encounter  Medications  . methylPREDNISolone (MEDROL) 4 MG tablet    Sig: Take as directed    Dispense:  21 tablet    Refill:  0  . methocarbamol (ROBAXIN) 500 MG tablet    Sig: Take 1 tablet (500 mg total) by mouth 3 (three) times daily.    Dispense:  40 tablet    Refill:  1      Procedures: No procedures performed   Clinical Data: No additional findings.   Subjective: Chief Complaint  Patient presents with  . Left Elbow - Pain    HPI Kimberly Solis is a 57 year old female comes in today complaining of left elbow pain.  Upon further discussion with her she has some pain down her left arm anterior fingers and she has numbness in her fingers it particularly affects her left thumb.  She describes the pain as a throbbing pain down the arm.  She does have some neck pain.  She said no known injury.  She is tried Advil ibuprofen Maxie freeze ice and heat without much relief.  She is also tried massage.  She is diabetic last  hemoglobin A1c was 6.6.  Pains been ongoing for the past 2 months. Review of Systems Denies fevers chills shortness of breath or chest pain.  Objective: Vital Signs: LMP  (LMP Unknown)   Physical Exam General well-developed well-nourished female no acute distress mood affect appropriate.  Psych alert and oriented x3.  Vascular radial pulses are intact bilaterally.  Forearm compartments are soft and nontender. Ortho Exam Cervical spine she has positive Spurling's.  Slight discomfort with extension of the cervical spine.  Tenderness left medial border of scapula in the left trapezius region.  5 out of 5 strength throughout the upper extremities including external and internal rotation of bilateral shoulders against resistance.  Left elbow no rashes skin lesions ulcerations erythema or edema compared to the right elbow.  She does have tenderness along medial lateral epicondyle region.  No significant pain with extension and flexion of the wrist against resistance left.  She has full motor bilateral hands.  Sensation grossly intact bilateral hands light touch. Specialty Comments:  No specialty comments available.  Imaging: Xr Elbow Complete Left (3+view)  Result Date: 10/26/2017 Left  elbow 2 views: Elbows well located.  No acute fractures no bony abnormalities.  No significant arthritic changes.  Xr Cervical Spine 2 Or 3 Views  Result Date: 10/26/2017 Cervical spine: No acute fractures.  Disc space all well-maintained.  No spinal listhesis.  Degenerative changes with endplate spurring at C 4 through C6 .    PMFS History: Patient Active Problem List   Diagnosis Date Noted  . Diabetic neuropathy ([place]) 03/15/2015  . Diabetes mellitus type 2 with complications ([place]) 16/02/9603  . Hypertension   . Hyperlipidemia   . Anxiety   . Ulcerative colitis ([place])    Past Medical History:  Diagnosis Date  . Anxiety   . Diabetes mellitus without complication ([place])   . Hyperlipidemia   .  Hypertension   . Ulcerative colitis ([place]) 05/05/2005    History reviewed. No pertinent family history.  History reviewed. No pertinent surgical history. Social History   Occupational History  . Not on file  Tobacco Use  . Smoking status: Never Smoker  . Smokeless tobacco: Never Used  Substance and Sexual Activity  . Alcohol use: No  . Drug use: No  . Sexual activity: Not on file

## 2017-11-09 ENCOUNTER — Ambulatory Visit (INDEPENDENT_AMBULATORY_CARE_PROVIDER_SITE_OTHER): Payer: Commercial Managed Care - PPO | Admitting: Physician Assistant

## 2017-11-09 ENCOUNTER — Encounter (INDEPENDENT_AMBULATORY_CARE_PROVIDER_SITE_OTHER): Payer: Self-pay | Admitting: Physician Assistant

## 2017-11-09 DIAGNOSIS — M7712 Lateral epicondylitis, left elbow: Secondary | ICD-10-CM | POA: Diagnosis not present

## 2017-11-09 DIAGNOSIS — M542 Cervicalgia: Secondary | ICD-10-CM | POA: Diagnosis not present

## 2017-11-09 MED ORDER — METHYLPREDNISOLONE ACETATE 40 MG/ML IJ SUSP
40.0000 mg | INTRAMUSCULAR | Status: AC | PRN
  Administered 2017-11-09: 40 mg

## 2017-11-09 MED ORDER — LIDOCAINE HCL 2 % IJ SOLN
1.0000 mL | INTRAMUSCULAR | Status: AC | PRN
  Administered 2017-11-09: 1 mL

## 2017-11-09 NOTE — Progress Notes (Signed)
Office Visit Note   Patient: Kimberly Solis           Date of Birth: 28-May-1960           MRN: [id number] Visit Date: 11/09/2017              Requested by: Orlena Sheldon, PA-C [address] [place], [place] [address] PCP: Orlena Sheldon, PA-C   Assessment & Plan: Visit Diagnoses:  1. Lateral epicondylitis, left elbow   2. Cervicalgia     Plan: She is given lifting techniques for her arm.  Also discussed some home exercises she can do for the lateral epicondylitis.  She will continue her neck exercises.  If her neck pain or radicular symptoms return she will call our office we can order an MRI of her cervical spine.  In regards to the elbow she will follow-up on an as-needed basis pain persist or becomes worse.  Follow-Up Instructions: Return if symptoms worsen or fail to improve.   Orders:  Orders Placed This Encounter  Procedures  . Hand/UE Inj   No orders of the defined types were placed in this encounter.     Procedures: Hand/UE Inj: L elbow for lateral epicondylitis on 11/09/2017 10:07 AM Details: 22 G needle, lateral approach Medications: 1 mL lidocaine 2 %; 40 mg methylPREDNISolone acetate 40 MG/ML      Clinical Data: No additional findings.   Subjective: Chief Complaint  Patient presents with  . Left Elbow - Pain, Follow-up  . Neck - Follow-up    HPI Mrs. Corson returns today follow-up of her neck pain.  She states that the Medrol Dosepak and exercises definitely helped.  She mainly complaint left elbow pain today.  Most her pain is lateral aspect however she does have some medial aspect.  She is no longer having as much numbness tingling hands that she was having.  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 Review of Systems Please see HPI otherwise negative  Objective: Vital Signs: LMP  (LMP Unknown)   Physical Exam  Constitutional: She is oriented to person, place, and time. She  appears well-developed and well-nourished. No distress.  Pulmonary/Chest: Effort normal.  Neurological: She is alert and oriented to person, place, and time.  Skin: She is not diaphoretic.    Ortho Exam Left elbow she has good range of motion elbow no rashes skin ulcerations.  Extension of the wrist against resistance causes pain lateral elbow.  She has some mild discomfort with flexion of the wrist against resistance medial aspect of the elbow.  Bilateral hands full sensation full motor.  Radial pulses are intact bilaterally. Specialty Comments:  No specialty comments available.  Imaging: No results found.   PMFS History: Patient Active Problem List   Diagnosis Date Noted  . Diabetic neuropathy ([place]) 03/15/2015  . Diabetes mellitus type 2 with complications ([place]) 88/32/5498  . Hypertension   . Hyperlipidemia   . Anxiety   . Ulcerative colitis ([place])    Past Medical History:  Diagnosis Date  . Anxiety   . Diabetes mellitus without complication ([place])   . Hyperlipidemia   . Hypertension   . Ulcerative colitis ([facility] [place]) 05/05/2005    No family history on file.  No past surgical history on file. Social History   Occupational History  . Not on file  Tobacco Use  . Smoking status: Never Smoker  . Smokeless  tobacco: Never Used  Substance and Sexual Activity  . Alcohol use: No  . Drug use: No  . Sexual activity: Not on file

## 2017-11-16 ENCOUNTER — Other Ambulatory Visit: Payer: Self-pay | Admitting: Physician Assistant

## 2017-11-24 ENCOUNTER — Other Ambulatory Visit: Payer: Self-pay

## 2017-11-24 MED ORDER — LANCETS ULTRA THIN 30G MISC: 3 refills | Status: DC

## 2017-12-24 ENCOUNTER — Ambulatory Visit (INDEPENDENT_AMBULATORY_CARE_PROVIDER_SITE_OTHER): Payer: Commercial Managed Care - PPO | Admitting: Physician Assistant

## 2017-12-24 ENCOUNTER — Encounter: Payer: Self-pay | Admitting: Physician Assistant

## 2017-12-24 VITALS — BP 124/76 | HR 75 | Temp 98.6°F | Resp 16 | Ht 66.0 in | Wt 239.2 lb

## 2017-12-24 DIAGNOSIS — E785 Hyperlipidemia, unspecified: Secondary | ICD-10-CM

## 2017-12-24 DIAGNOSIS — F419 Anxiety disorder, unspecified: Secondary | ICD-10-CM | POA: Diagnosis not present

## 2017-12-24 DIAGNOSIS — I10 Essential (primary) hypertension: Secondary | ICD-10-CM | POA: Diagnosis not present

## 2017-12-24 DIAGNOSIS — E118 Type 2 diabetes mellitus with unspecified complications: Secondary | ICD-10-CM | POA: Diagnosis not present

## 2017-12-24 NOTE — Progress Notes (Signed)
Patient ID: SAGA BALTHAZAR MRN: [id number], DOB: 06-Sep-1960, 57 y.o. Date of Encounter: @DATE @  Chief Complaint:  No chief complaint on file.   HPI: 57 y.o. year old white female  presents for routine followup office visit.  At her OV 02/03/13  she reported that her father recently passed away. She reported that he had been very ill for quite some time prior to his death. During that time she was just having to do what she could do to get through each day. Was having to eat whatever she could. Was having to help with transporting her mother back and forth to the hospital et [name]. She was not exercising. At 02/2014 OV she reports that she recently well and her mother to a donor tissue program at [facility]. Says it was very nice and they honored the people who have donated tissues and organs over the past year including her father.  Also at the 02/2013 visit she reported that her husband has lupus and that her daughter had just recently been diagnosed with lupus as well.   She says that she is used to working with seniors and being around seniors.   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  She says that her husband and daughter both see Dr. [name] for their Lupus.   At her OV 08/2013 she reported that her husband was having bilateral knee replacements by Dr. [name] the next day.  At f/u OV she reported that he did have the bilateral knee replacements. She says that he has done excellent. She says that rather than sending him somewhere for a rehabilitation/physical therapy, Dr. [name] allowed him to go home and for her to care for him. She says that he did excellent with this. She was able to cook healthy foods for him and keep him much more comfortable. Says that he is was just able to return to work this week. Says she is delighted to see that he is able to return to his work which  he enjoys and not be in pain.  At prior OV she told me that in the past they have already gone through their 401(k) money to pay for medical expenses. Says that now he recently had to change the mortgage so that they could figure out ways to pay for medical expenses now. Says that all of this has been extremely stressful in addition to fact that like this upcoming week she will need to be out of work to help care for him. As well her mom as well as his mom to keep him busy with them try to help care for them. Said that there was constant stress. However she says that she thinks feels that she is handling it pretty well. Today she seems to be passed the high stress and much better.   At [place] 08/1013 she reported that she recently had been having to take one Xanax every night. Says that otherwise her muscles are extremely tight and she is not sleeping well. Said the Xanax relaxes her muscles and also helps her get to sleep. She never takes it during the day and only takes it at night.  At OV--11/2013--reports that she has not needed her Xanax in over 3 weeks.  Says at this point she would like to just have them available if she does have a day of high stress  and feels uptight and having hard time to relax. However is not needing them on a routine basis like she did in the past.   At [place] says she thinks she has lost some weight since the last visit. says they have been eating lots of vegetables and eating very healthy.  Did check. At her visit 08/2013 weight was 220. Today 11/2013 weight 215. Weight down 5 pounds!!  At 08/2013 office visit-- did lab work-- revealed A1c up to 7.7. At that result note, I said to add Actos 30 mg daily. At [place] 11/2013  She said that she did not start the medicine yet because she was on his talk to me about long-term safety first.  At that 11/2013 OV we did discuss this and she was agreeable to take it if needed. However , her weight was down and we  rechecked A1c that day --told her  she did not have to add Actos. She never had to add Actos.   08/31/2014 OV--She says that she has been feeling good.  (Says that her mom has been having a lot of problems secondary to her Parkinson's). Pt states that she did not add the Actos that I recommended at the last lab 05/25/14. At that time A1c came back 7.2 and recommended to add Actos 15 mg. Today I asked her she was worried about that drug in particular that we have plenty of other options and we can add a different oral medication. She says that she was just concerned that her sugar would get too low. Says that if A1c comes back high again she is agreeable to add this and is fine with taking Actos--Has no concerns about that medication in particular--says was just concerned about BS getting too low.   08/31/2014--A1C was 7.3---At OV 12/07/2014--She reports that she DID add the Actos. Is taking Actos 49m 1 po QD.   She is taking Crestor -- however she does tell me that quite a while back she decreased the dose to taking one half of a pill. Says that she was having significant discomfort in her feet. She wasn't sure if the symptoms were from diabetic neuropathy or not. However she can cut the Crestor in half and says that the symptoms in her feet improved 90%. Says that she was on a half tablet for a long time prior to the labs in 08/2013.  05/25/14, 08/31/2014, 12/07/2014----states still cutting Crestor in half and taking CoQ10.  No symptoms and last FLP was on this dose and excellent.   12/07/2014:  She says that things have been good. Says that the only thing as the GYN added hormones mid and the and progesterone for hot flashes and this is caused weight gain. Says that she is eating the exact same thing she was before but feels like she is just blowing up. Otherwise has been feeling great. Is very excited. Her, her husband, her daughter and son[place]in[place]. Says that it has been many years since they  have gone on any vacation except for just things with family or [facility]  03/15/15: She is in very good spirits today. Says a new baby coming to the family. [place]  Says her Gyn Rxed Phenteramine but she wanted to aks me about it before she started it.  Says he is going through menopause. However because she had an IUD in for years she  has been having problems with bleeding. Says that for these reasons her gynecologist has increased the dose of her hormones. This is causing her to have weight gain and therefore GYN prescribed phentermine. Patient states that she is taking the Actos that have been added by me in the past. Is taking all other medications as directed/as listed. She reports that her feet hurt at the end of the day and some of her toes burn. Today I discussed medications for this and initially she was deferring medication but then was agreeable to go ahead and add medicine for this. At that visit--prescribed Neurontin.  06/21/2015: Patient states that she did take the Neurontin but she isn't even having to take it anymore. Says that her feet are not even hurting anymore. Says that her gynecologist still has her on hormones and "everything is better except weight gain". She says that she really doesn't think that she is eating any more than she used to in the past and really does not think that it is causing food cravings but feels like it "is just making her swell up and blow up and gain weight". Did not use any phentermine that the gynecologist prescribed. Reviewed the labs performed here at last visit 03/15/2015 A1c was 7.0 and I recommended her increase Actos to 45 mg. She says she did make that change and is taking the Actos 45 mg. No complaints or concerns today other than the weight gain. Says that they are going to have her mother move in with them. However says that it will be a transitional process as she has to get  her pottery materials from her mom's house and make a space for those --then work on making her mother.  10/25/2015; Says her husband had rotator cuff surgery. He is out of work for 3 months. He is helping with laundry, helping with housework and it is driving her crazy b/c he does everything different than she does.  Says she never took the phenteramine. Again, says not eating any different, not doing anything different, except for the hormones from Gyn. Did finally get building to put her ceramic supplies in so can prepare for mom to move in with them.  01/31/2016: Asks what her weight is today compared to last visit. Reviewed that her weight is actually down a few pounds. She says that she is so glad. Says that since these hormone issues with GYN she has really struggled with her weight. Says that she has been eating extremely healthy and making sure not to eat anything after 6:30 PM. Says that she has her annual physical with GYN next Friday. Says that she does need a refill on her Xanax. Says that she only uses it occasionally at night about once a week. Taking Diabetes meds as directed. No adv effects.  Taking statin. No myalgias or other adv effects.  Zoloft still working well. Not feeling anxious or depressed. No adv effects.  05/29/2016: Today she reports that even in the very cold temperatures they moved her pottery and have made that space and has been working on that project. Also constantly helping with her mother who has Parkinson's and caring for her. She reports that she has had sore area in the back of her right throat that started sometime in December. Says that in the beginning she could feel something that felt like a little skin tag that she Scratched off but still has some sore areas with redness around them. Right side of neck feels  a little bit sore as well. No other specific concerns to address today. Taking Diabetes meds as directed. No adv effects.  Taking statin. No  myalgias or other adv effects.  Zoloft still working well. Not feeling anxious or depressed. No adv effects.   09/11/2016: She reports that she has gotten her mother moved in with her. She also has gotten all of her pottery moved to her house. Says she feels like some burden has been released from her-- to know that her mom is safe and sound-- and living with her. Says that she was having a lot of problems with her right shoulder and right elbow and has had a cortisone shot to the shoulder and the elbow so wanted me to be aware this may have caused increase in blood sugar/A1c. Also did physical therapy for 1-1/2 months. Says that it is much better but still has issues with the right shoulder and will continue to do the stretches and exercises. She has continued to cut Crestor 20 mg in half but is asking if I can just reduce the dose to where she doesn't have to continue cutting pills so we'll do so. Also does want refill on Xanax just to have available to use if needed. Also sometimes uses this at night and this seems to help relax her tight muscles and relax her to sleep better than any other type of medicine. Taking Diabetes meds as directed. No adv effects.  Taking statin. No myalgias or other adv effects.  Zoloft still working well. Not feeling anxious or depressed. No adv effects.    12/11/2016: I asked how things are going now that she has her mom moved in and her pottery moved to her house. I thought she would finally feel settled and have some peace of mine. Instead, she says that her mom is about driving her crazy. Says her mom will ask the same question or say the same thing over and over. She says that once she gets something on her mind she will not let it go. They have been getting boxes of tomatoes from [place] and making spaghetti sauce. Has gone through many many boxes of tomatoes. Also her mom was in the habit of doing very small load of laundry but doing it every day. Would wash  the clothes from the day before and also would wash her sheets etc. Patient has been having to get her to realize that they are not continue that habit. Otherwise patient has been doing well. She is taking the Crestor daily. Myalgias or other adverse effects. She is taking the Zoloft daily and uses the Xanax if needed. The Zoloft is working well and is controlling her anxiety depression. He denies and it is an adjustment with her mom and she does feel that these are controlled. She is taking her diabetic medicines as directed. Is having no low blood sugars.  Addendum Added:  I received office note from diabetic eye exam. Performed 02/24/2017. Revealed no retinopathy.  03/12/2017: Today patient states that her mom stays with her 3 days a week and stays with her sister the other days of the week.  Says that this is working much better.  Says that her mom is getting worse regarding her condition and it was just too much to have her mom 24/7.  Now she can have a break from taking care of her mom on the days that the mom is with the sister. Otherwise says that things have been stable  and going well.  No specific concerns to address today. She is taking the Crestor daily. No myalgias or other adverse effects. She is taking the Zoloft daily and uses the Xanax if needed. The Zoloft is working well and is controlling her anxiety depression. He denies and it is an adjustment with her mom and she does feel that these are controlled. She is taking her diabetic medicines as directed. Is having no low blood sugars.   06/18/2017: Reports that things with her mom have been worse recently.  She had to go to the hospital around Christmas and is in the hospital now.  Says that she is in a behavioral health Hospital in [place].  Says that at 2 AM every night she would be wide awake doing things.  Recent example-- thought that there was a group of people there that she needed to be cooking a big meal for and it was 2  in the morning.  States that her emotions have been like a roller coaster.  Also have been having to deal with trying to find facilities that can take care of her mom and dealing with financial issues etc.  We had discussion that my family has been going through the same thing with my grandfather.  Needing a refill on her Xanax.  Says that she has to take a half at night and then her back relaxes and she is able to sleep. She is taking the Crestor daily. No myalgias or other adverse effects. She is taking the Zoloft daily and uses the Xanax if needed. The Zoloft is working well and is controlling her anxiety depression. He denies and it is an adjustment with her mom and she does feel that these are controlled. She is taking her diabetic medicines as directed. Is having no low blood sugars.   09/17/2017: Today she reports that her mother actually passed away on Mother's Day.  States that she feels very at peace with all of this.  States that her mother had gone from the hospital to her sister's house and was at her sister's house when she passed away.  Says that the sister had felt that the medications they had the mother on were making her worse and got her home and got her off of some of those medicines and that she had been up walking around doing great the day before she passed away.  Also names off multiple family members who had time to come and spend time with her mom before she passed.  She says they will have a very private, intimate "service". She talks about the fact that they had her father cremated and they are going to do the same with her mother and what she is going to do with those ashes.  They are distributing them sharing them among family members but for her part she has seen a beautiful way to use them in a display.  Says that her sister has already said that she wants to buy the mom's house and that her daughter is already renting and living there.  She seems very at peace with  everything. She states that she does hope to start to focus on her own health now that she will not have these other distractions and issues to do deal with. Otherwise has no specific concerns to address and has been feeling stable in regards to her own health. Continues to take the Crestor daily and is having no myalgias or other adverse effects. She is  taking the diabetic medications as directed and these are causing no adverse effects. The Zoloft is working well at keeping her anxiety and depression controlled and the medicine is causing no adverse effects. She reports that recently she has noticed a mild pressure in her low mid abdomen.  Says that while she is here she just wants to check to make sure she is not starting a UTI.  States that if this is normal then the abdominal pain has been extremely mild and not anything real concerning but just wanted to check for a UTI while she is here.  Otherwise we will continue to monitor symptoms and follow-up if symptoms worsen.    12/24/2017: She reports that she has recently had to go to orthopedics.  States that they gave her prednisone and then an cortisone injection to the elbow.  Says that they did an x-ray to her neck that showed some bone spurs and disc.  Told her that if her neck did not get better they would get an MRI.  Says that they also prescribed methocarbamol.  She is also having discomfort around her shoulder blades and is been using heating pad. Otherwise things with her health have been pretty stable.  No other specific concerns to address today. She is taking the metformin and the Actos as directed. Is taking the Crestor daily.  This is causing no myalgias or other adverse effects. She is taking the Zoloft.  This is controlling her mood.  Her anxiety and depression are controlled with this.  Medication is causing no adverse effects. He does bring in form for me to complete for her insurance.  I have entered weight height BMI lipids  glucose A1c.  Her mammogram report is in epic so I was able to enter that information.  She has her Pap smear by Dr. Tressia Danas to have told her to go there for them to enter that information as I do not have that information.  Form is also asking about colonoscopy.  She refuses colonoscopy but is agreeable to do Cologuard so we are giving her the Cologuard information today.        Past Medical History:  Diagnosis Date  . Anxiety   . Diabetes mellitus without complication ([place])   . Hyperlipidemia   . Hypertension   . Ulcerative colitis ([place]) 05/05/2005     Home Meds:  Outpatient Prescriptions Prior to Visit  Medication Sig Dispense Refill  . ALPRAZolam (XANAX) 0.5 MG tablet Take 1 tablet (0.5 mg total) by mouth 2 (two) times daily as needed for sleep.  60 tablet  2  . aspirin 81 MG tablet Take 81 mg by mouth daily.      . Coenzyme Q10 (COQ10 PO) Take 1 tablet by mouth daily.      . Multiple Vitamin (MULTIVITAMIN) tablet Take 1 tablet by mouth daily. [facility] +50      . ONE TOUCH ULTRA TEST test strip TEST SUGAR TWICE A DAY  50 each  12  . ONETOUCH DELICA LANCETS 16X MISC 1 each by Other route 2 (two) times daily.  100 each  11  . CRESTOR 20 MG tablet TAKE 1 TABLET BY MOUTH EVERY DAY  30 tablet  0  . metFORMIN (GLUCOPHAGE) 1000 MG tablet TAKE 1 TABLET BY MOUTH TWICE A DAY  60 tablet  5  . sertraline (ZOLOFT) 100 MG tablet TAKE 1 TABLET EVERY DAY   30 tablet  5  . pioglitazone (ACTOS) 30 MG tablet Take  1 tablet (30 mg total) by mouth daily.  30 tablet  2   No facility-administered medications prior to visit.     Allergies:  Allergies  Allergen Reactions  . Doxycycline Shortness Of Breath    Social History   Socioeconomic History  . Marital status: Married    Spouse name: Not on file  . Number of children: Not on file  . Years of education: Not on file  . Highest education level: Not on file  Occupational History  . Not on file  Social Needs  . Financial resource  strain: Not on file  . Food insecurity:    Worry: Not on file    Inability: Not on file  . Transportation needs:    Medical: Not on file    Non-medical: Not on file  Tobacco Use  . Smoking status: Never Smoker  . Smokeless tobacco: Never Used  Substance and Sexual Activity  . Alcohol use: No  . Drug use: No  . Sexual activity: Not on file  Lifestyle  . Physical activity:    Days per week: Not on file    Minutes per session: Not on file  . Stress: Not on file  Relationships  . Social connections:    Talks on phone: Not on file    Gets together: Not on file    Attends religious service: Not on file    Active member of club or organization: Not on file    Attends meetings of clubs or organizations: Not on file    Relationship status: Not on file  . Intimate partner violence:    Fear of current or ex partner: Not on file    Emotionally abused: Not on file    Physically abused: Not on file    Forced sexual activity: Not on file  Other Topics Concern  . 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   She is married.   Her husband has lupus.   Her daughter was recently diagnosed with lupus.   Her mother has Parkinson's disease. Patient is involved with helping care for her.    History reviewed. No pertinent family history.   Review of Systems:  See HPI for pertinent ROS. All other ROS negative.    Physical Exam: Blood pressure 124/76, pulse 75, temperature 98.6 F (37 C), temperature source Oral, resp. rate 16, height 5' 6"  (1.676 m), weight 108.5 kg, SpO2 98 %., There is no height or weight on file to calculate BMI. General: WF. Appears in no acute distress. Neck: Supple. No thyromegaly. No lymphadenopathy. No carotid bruits. Lungs: Clear bilaterally to auscultation without wheezes, rales, or rhonchi. Breathing is unlabored. Heart: RRR with S1 S2. No murmurs, rubs, or gallops. Abdomen: Soft, non-tender,  non-distended with normoactive bowel sounds. No hepatomegaly. No rebound/guarding. No obvious abdominal masses. Musculoskeletal:  Strength and tone normal for age. Extremities/Skin: Warm and dry.  No LE edema.  Neuro: Alert and oriented X 3. Moves all extremities spontaneously. Gait is normal. CNII-XII grossly in tact. Psych:  Responds to questions appropriately with a normal affect.   Diabetic foot exam: Inspection is normal. No deformities. No ulcerations or skin breakdown. Sensation is intact to touch and monofilament test bilaterally. She has trace bilateral dorsalis pedis and posterior tibial pulses.      ASSESSMENT AND PLAN:  57 y.o. year old female with     Diabetes mellitus type 2 with complications  -  Microalbumin, urine--done 05/25/14, 10/25/2015, 12/11/2016, 12/24/2017  She had diabetic eye exam  ADDENDUM----- added 01/24/2015--- received note from her ophthalmologist from exam 01/17/2015. Exam showed no diabetic retinopathy. Addendum Added:  I received office note from diabetic eye exam. Performed 02/24/2017. Revealed no retinopathy.  Foot exam is normal  She is on Aspirin 22m QD She is on statin.  She is on no ACE inhibitor  secondary to low blood pressure. I have discussed again today the long-term benefits of renal protection of being on an ACE inhibitor. Discussed trying a very small dose. She defers. Says that she does not want to take any medications that she does not absolutely have to.  Pneumovax 23 given here 08/2013.  12/11/2016: Check  A1c, MicroAlbumin 03/12/2017--Check A1C 09/17/2017: Recheck A1C 12/24/2017: Check A1C, MicroAlbumin   Diabetic polyneuropathy associated with type 2 diabetes mellitus [place]---Presrcibed: Wrote down the following instructions for her:    Day 1: take 1 at night time.    Day 2: take 1 twice daily.    Starting on day 3: take one 3 times daily - gabapentin (NEURONTIN) 300 MG capsule; Take 1 capsule (300 mg total) by mouth  3 (three) times daily.  Dispense: 90 capsule; Refill: 3 At OV 06/21/15-- says that she is no longer needing this medicine and is no longer having pain in her feet even off of medication.  Hyperlipidemia She is on Crestor See HPI. She really ONLY TAKES 1/2 TABLET. Lipid panel was excellent at check 10/152015. LFTs normal. She says that taking 1/2 of Crestor in combination with CoQ 10 is perfect for her.  05/29/2016: Taking med as above. Recheck lipid panel and LFT today to monitor. 09/11/2016--- lipids were at goal 1/18. LFTs were normal. Can wait to recheck these labs at next visit. 12/11/2016----On Crestor. Recheck FLP/LFT now. 03/12/2017--- continue Crestor.  Lipid panel was excellent 12/2016 and LFTs were normal. 06/18/2017: She is fasting.  Recheck FLP/LFT to monitor. 09/17/2017: [place]  LDL 98.  Continue current dose of Crestor. 12/24/2017: Recheck FLP/LFT now to monitor.  Is on Crestor.  BP: Blood pressure is on the low side. Therefore can not add ACE inhibitor. Discussed the renal protection of an ACE inhibitor given her diabetes. However she does not want to take any medications as she does absolutely does not have to take. She defers this prescription even after I discussed prescribing just a very low dose. 09/17/2017:  Blood Pressure is well controlled/ at goal today. 12/24/2017: Blood Pressure is well controlled/ at goal today.  Continue current treatment.  Anxiety This is well controlled with Zoloft. Currently, she is rarely needing Xanax. Continue to use this as needed. 06/18/2017::  stable/controlled with current medication. Taking Zoloft daily and rarely uses Xanax.  Is now taking one half Xanax at night so that she can relax and sleep.  See HPI for detail from today. 09/17/2017: Stable/controlled with current medications.  Taking Zoloft.  Using Xanax as needed. 12/24/2017: Stable/controlled with current medications.  Taking Zoloft.  Using Xanax as needed.  Mammogram: She has  had in the past year per patient report. Up-to-date. Sees Gyn routinely. 02/2014--Reports that she just recently had her annual GYN exam. Reports  GYN added HRT and says that this is controlling her hot flashes and that they are 100% better and she feels so much better now. 05/2014--Reports they removed IUD 03/12/2017: She states that she sees gynecologist routinely.  Has mammogram scheduled for next week.  Has her  Pap smear performed by GYN Dr. Tressia Danas. 12/24/2017:  Mammogram is up-to-date.  This report is in epic.   7. Colonoscopy: 2007. Repeat 10 years per patient. ---------- at [place] 06/21/15 discussed with her that colonoscopy is due this year. She says that she has already received a reminder / notification from Dr. Collene Mares and patient states that she will call and schedule follow-up there. At [place] 12/2016 asked patient if she ever had follow-up colonoscopy with Dr. Collene Mares. She says that she has not that that she had problems with the last one because she has ulcerative colitis. Is not want to do another colonoscopy right now. Says that her gynecologist has her do Hemoccult cards every year. 12/24/2017: Discussed colorectal cancer screening.  She is agreeable to do Cologuard.  8. Immunizations: Influenza vaccine given here 02/16/2014, 01/31/2016, 03/12/2017 Tetanus:  Given 12/11/2016 Pneumonia vaccine:  Pneumovax 23 given here 08/2013. No further pneumonia vaccine until age 60 Shingrix---Discussed at [place] 12/11/2016----she is to contact her insurance and find out regarding coverage and cost then let us know if she wants this.    Follow up office visit 3 months, sooner if needed.   Signed, [address] [place], [place], [facility] 12/24/2017 8:05 AM

## 2017-12-25 LAB — LIPID PANEL
Cholesterol: 188 mg/dL (ref <200)
HDL: 58 mg/dL (ref >50)
LDL Cholesterol (Calc): 106 mg/dL (calc) — ABNORMAL HIGH
Non-HDL Cholesterol (Calc): 130 mg/dL (calc) — ABNORMAL HIGH (ref <130)
TRIGLYCERIDES: 129 mg/dL (ref <150)
Total CHOL/HDL Ratio: 3.2 (calc) (ref <5.0)

## 2017-12-25 LAB — MICROALBUMIN, URINE: Microalb, Ur: 1 mg/dL

## 2017-12-25 LAB — COMPLETE METABOLIC PANEL WITH GFR
AG Ratio: 1.9 (calc) (ref 1.0–2.5)
ALT: 12 U/L (ref 6–29)
AST: 14 U/L (ref 10–35)
Albumin: 4.2 g/dL (ref 3.6–5.1)
Alkaline phosphatase (APISO): 63 U/L (ref 33–130)
BILIRUBIN TOTAL: 0.3 mg/dL (ref 0.2–1.2)
BUN: 13 mg/dL (ref 7–25)
CHLORIDE: 106 mmol/L (ref 98–110)
CO2: 24 mmol/L (ref 20–32)
Calcium: 9.5 mg/dL (ref 8.6–10.4)
Creat: 0.8 mg/dL (ref 0.50–1.05)
GFR, EST AFRICAN AMERICAN: 96 mL/min/{1.73_m2} (ref >=60)
GFR, Est Non African American: 82 mL/min/{1.73_m2} (ref >=60)
GLOBULIN: 2.2 g/dL (ref 1.9–3.7)
Glucose, Bld: 114 mg/dL — ABNORMAL HIGH (ref 65–99)
Potassium: 4.7 mmol/L (ref 3.5–5.3)
SODIUM: 142 mmol/L (ref 135–146)
Total Protein: 6.4 g/dL (ref 6.1–8.1)

## 2017-12-25 LAB — HEMOGLOBIN A1C
EAG (MMOL/L): 8.1 (calc)
Hgb A1c MFr Bld: 6.7 % of total Hgb — ABNORMAL HIGH (ref <5.7)
MEAN PLASMA GLUCOSE: 146 (calc)

## 2018-01-10 ENCOUNTER — Other Ambulatory Visit: Payer: Self-pay | Admitting: Physician Assistant

## 2018-01-23 IMAGING — MG MM SCREENING BREAST TOMO BILATERAL
8 of 12 series · 8 of 28 positions shown · non-contrast
Comparison: Previous exam(s).

CLINICAL DATA: Screening.

EXAM:
2D DIGITAL SCREENING BILATERAL MAMMOGRAM WITH CAD AND ADJUNCT TOMO

[L MLO synth-2D]
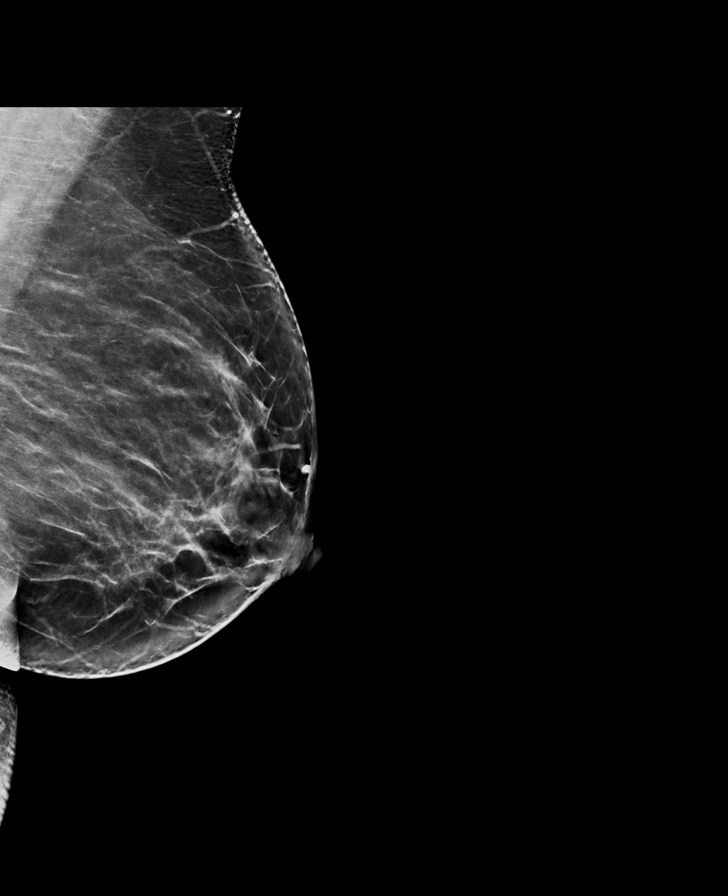

[R CC synth-2D]
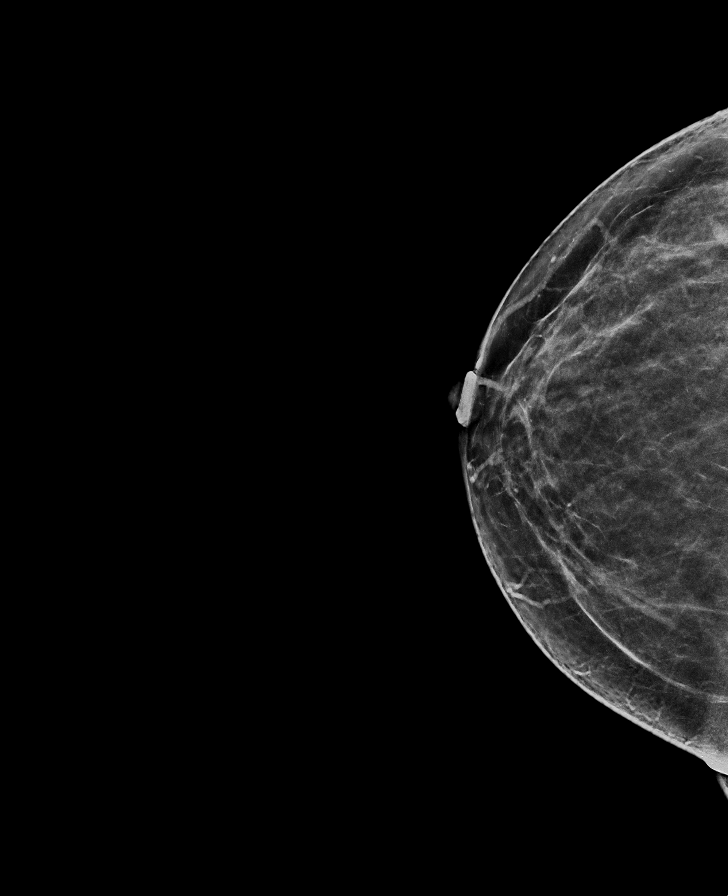

[L MLO]
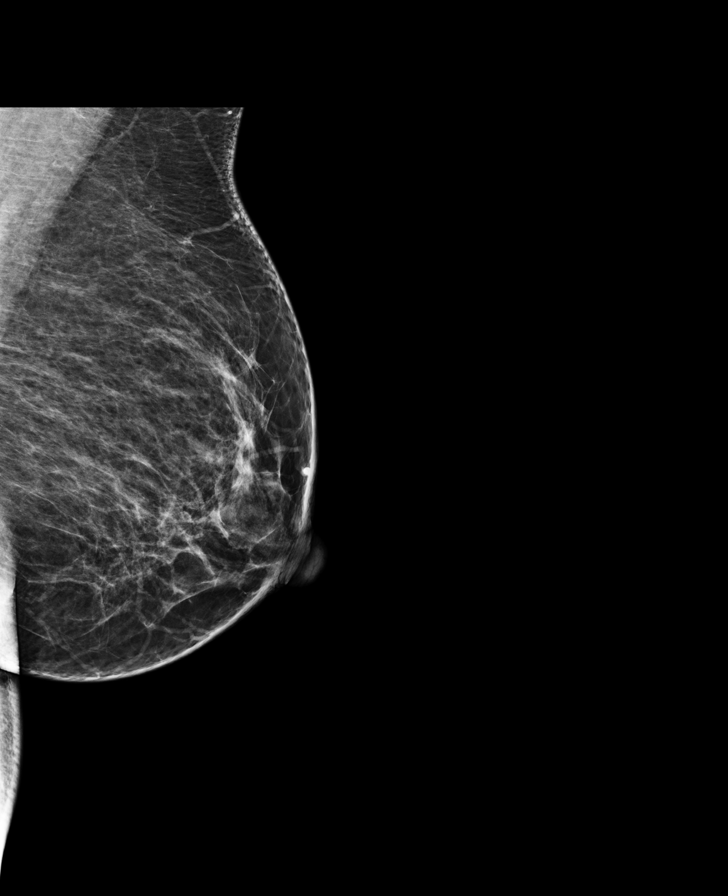

[L CC synth-2D]
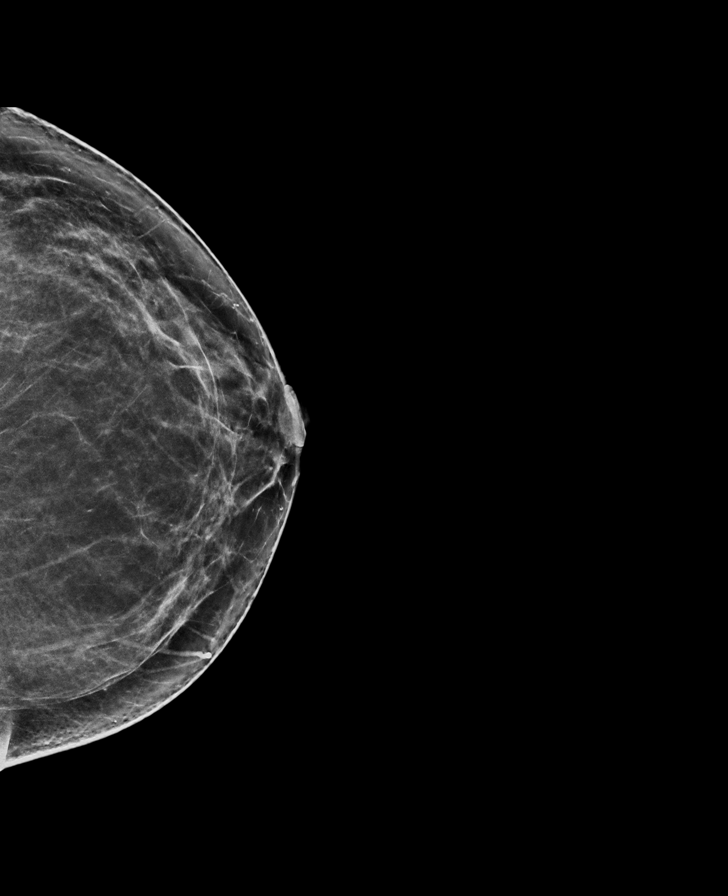

[L CC]
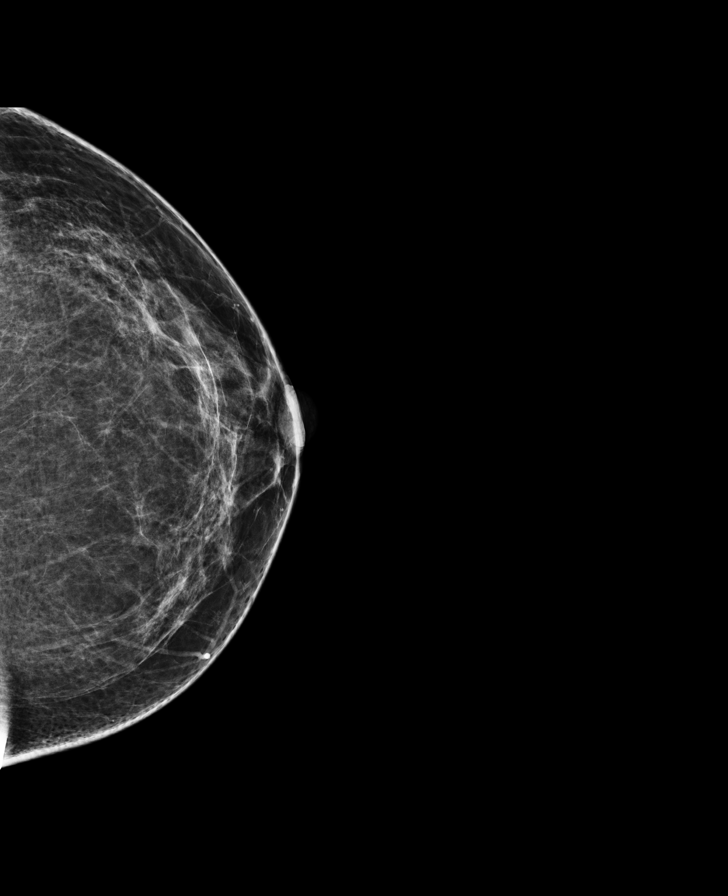

[R CC]
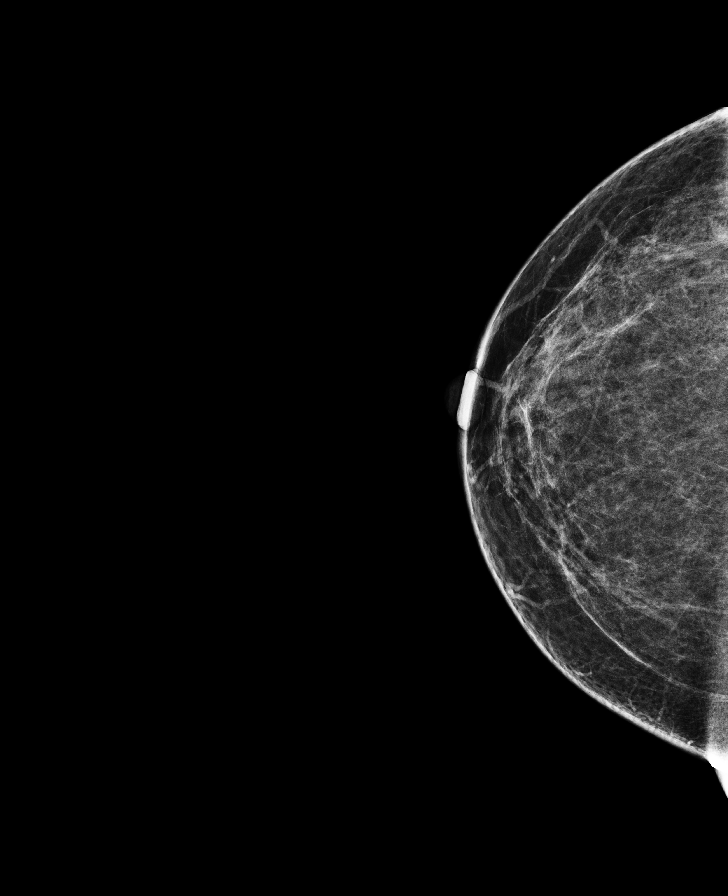

[R MLO]
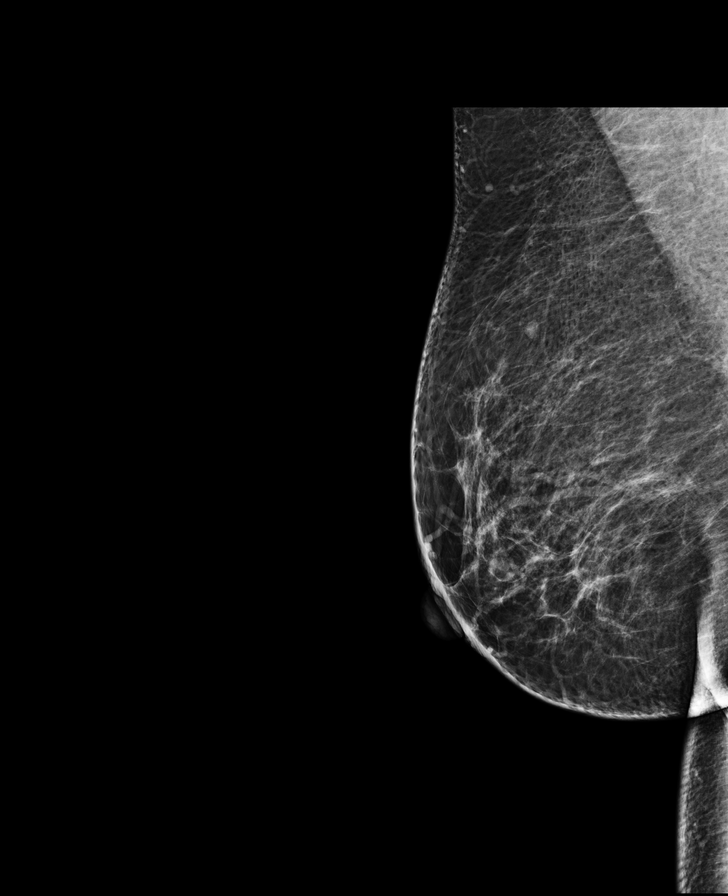

[R MLO synth-2D]
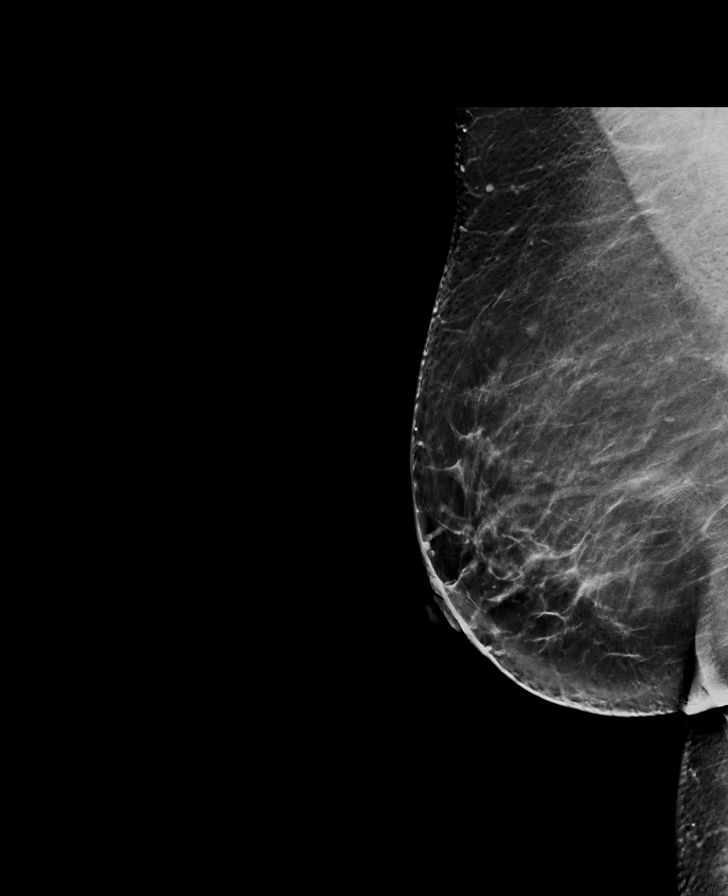

[8 of 28 positions shown; findings below may reference images not displayed]

ACR Breast Density Category c: The breast tissue is heterogeneously
dense, which may obscure small masses.
FINDINGS: There are no findings suspicious for malignancy. Images were
processed with CAD.
IMPRESSION: No mammographic evidence of malignancy. A result letter of this
screening mammogram will be mailed directly to the patient.

RECOMMENDATION:
Screening mammogram in one year. (Code:[id number])

BI-RADS CATEGORY  1: Negative.

## 2018-02-09 ENCOUNTER — Other Ambulatory Visit: Payer: Self-pay | Admitting: Physician Assistant

## 2018-02-16 ENCOUNTER — Other Ambulatory Visit: Payer: Self-pay | Admitting: Family Medicine

## 2018-02-16 DIAGNOSIS — Z1231 Encounter for screening mammogram for malignant neoplasm of breast: Secondary | ICD-10-CM

## 2018-03-19 DIAGNOSIS — H52221 Regular astigmatism, right eye: Secondary | ICD-10-CM | POA: Diagnosis not present

## 2018-03-19 DIAGNOSIS — H40053 Ocular hypertension, bilateral: Secondary | ICD-10-CM | POA: Diagnosis not present

## 2018-03-19 DIAGNOSIS — H40013 Open angle with borderline findings, low risk, bilateral: Secondary | ICD-10-CM | POA: Diagnosis not present

## 2018-03-19 DIAGNOSIS — H524 Presbyopia: Secondary | ICD-10-CM | POA: Diagnosis not present

## 2018-03-19 DIAGNOSIS — H40033 Anatomical narrow angle, bilateral: Secondary | ICD-10-CM | POA: Diagnosis not present

## 2018-03-19 LAB — HM DIABETES EYE EXAM

## 2018-03-29 ENCOUNTER — Ambulatory Visit
Admission: RE | Admit: 2018-03-29 | Discharge: 2018-03-29 | Disposition: A | Discharge status: Home / Self Care | Payer: Commercial Managed Care - PPO | Source: Ambulatory Visit | Attending: Family Medicine | Admitting: Family Medicine

## 2018-03-29 DIAGNOSIS — Z1231 Encounter for screening mammogram for malignant neoplasm of breast: Secondary | ICD-10-CM | POA: Diagnosis not present

## 2018-04-05 DIAGNOSIS — Z01419 Encounter for gynecological examination (general) (routine) without abnormal findings: Secondary | ICD-10-CM | POA: Diagnosis not present

## 2018-04-05 DIAGNOSIS — Z1212 Encounter for screening for malignant neoplasm of rectum: Secondary | ICD-10-CM | POA: Diagnosis not present

## 2018-04-05 DIAGNOSIS — Z6838 Body mass index (BMI) 38.0-38.9, adult: Secondary | ICD-10-CM | POA: Diagnosis not present

## 2018-04-08 ENCOUNTER — Ambulatory Visit: Payer: Commercial Managed Care - PPO | Admitting: Physician Assistant

## 2018-04-13 ENCOUNTER — Other Ambulatory Visit: Payer: Self-pay | Admitting: *Deleted

## 2018-04-13 MED ORDER — PIOGLITAZONE HCL 45 MG PO TABS: ORAL_TABLET | ORAL | 0 refills | Status: DC

## 2018-04-13 MED ORDER — SERTRALINE HCL 100 MG PO TABS: 100.0000 mg | ORAL_TABLET | Freq: Every day | ORAL | 0 refills | Status: DC

## 2018-05-04 ENCOUNTER — Ambulatory Visit (INDEPENDENT_AMBULATORY_CARE_PROVIDER_SITE_OTHER): Payer: Commercial Managed Care - PPO | Admitting: Family Medicine

## 2018-05-04 ENCOUNTER — Other Ambulatory Visit: Payer: Self-pay

## 2018-05-04 ENCOUNTER — Encounter: Payer: Self-pay | Admitting: Family Medicine

## 2018-05-04 VITALS — BP 118/64 | HR 84 | Temp 98.6°F | Resp 12 | Ht 66.0 in | Wt 237.0 lb

## 2018-05-04 DIAGNOSIS — F419 Anxiety disorder, unspecified: Secondary | ICD-10-CM | POA: Diagnosis not present

## 2018-05-04 DIAGNOSIS — E785 Hyperlipidemia, unspecified: Secondary | ICD-10-CM

## 2018-05-04 DIAGNOSIS — E119 Type 2 diabetes mellitus without complications: Secondary | ICD-10-CM

## 2018-05-04 DIAGNOSIS — E118 Type 2 diabetes mellitus with unspecified complications: Secondary | ICD-10-CM

## 2018-05-04 DIAGNOSIS — Z23 Encounter for immunization: Secondary | ICD-10-CM

## 2018-05-04 DIAGNOSIS — E0841 Diabetes mellitus due to underlying condition with diabetic mononeuropathy: Secondary | ICD-10-CM

## 2018-05-04 NOTE — Assessment & Plan Note (Signed)
Recheck her cholesterol.  We will likely change her down to pravastatin 80 mg secondary to the myalgias that she has been having with the Crestor.

## 2018-05-04 NOTE — Progress Notes (Signed)
Subjective:    Patient ID: Kimberly Solis, female    DOB: 19-Jan-1961, 57 y.o.   MRN: [id number]  Patient presents for Follow-up (is fasting)  Patient here to follow-up chronic medical problems.  She was a previous patient of my PA who is no longer at the practice.   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 She is on Xanax and zoloft, takes 1/2 tablet xanax as needed.   GAD does run in the family   Hypertension diagnosed noticed in chart, but has never had high blood pressure on been on any blood pressure medications  Hyperlipidemia-currently on Crestor taking 2.51m due to leg cramps, last LDL 106 , has family history of CAD in father  Diabetes mellitus taking metformin and Actos, last A1C 6.7% in August, CBG range < 130 , no hypoglycemia    Normal Urine micro August 2019  Father had Type I diabetes   She is on not ACE/ARB  [facility]- [place]   Ulcerative colitis, no recent flares, no current medications followed by Dr. JJuanita Craver had last scope 13 years ago  GYN- [facility]- Dr. GCandie Mile- she is on estradiol and progesterone, normal Mammo in November  Orthopedics- [facility]     Review Of Systems:  GEN- denies fatigue, fever, weight loss,weakness, recent illness HEENT- denies eye drainage, change in vision, nasal discharge, CVS- denies chest pain, palpitations RESP- denies SOB, cough, wheeze ABD- denies N/V, change in stools, abd pain GU- denies dysuria, hematuria, dribbling, incontinence MSK- denies joint pain, muscle aches, injury Neuro- denies headache, dizziness, syncope, seizure activity       Objective:    BP 118/64   Pulse 84   Temp 98.6 F (37 C) (Oral)   Resp 12   Ht 5' 6"  (1.676 m)   Wt 237 lb (107.5 kg)   LMP  (LMP Unknown)   SpO2 98%   BMI 38.25 kg/m  GEN- NAD, alert and  oriented x3 HEENT- PERRL, EOMI, non injected sclera, pink conjunctiva, MMM, oropharynx clear Neck- Supple, no thyromegaly CVS- RRR, no murmur RESP-CTAB ABD-NABS,soft,NT,ND EXT- No edema Pulses- Radial, DP- 2+        Assessment & Plan:      Problem List Items Addressed This Visit      Unprioritized   Anxiety    Longstanding history also runs in her family.  She will continue with the Zoloft she has been on this for 30+ years occasional use of the alprazolam.      Diabetes mellitus type 2 with complications ([place]    Diabetes has been well controlled.  She is monitoring her blood sugars regularly.  She is not on ACE or ARB She would benefit from.  She also does not have hypertension and review of the notations in her blood pressures.  She does have some neuropathy but there is also concerned that she had underlying myalgias secondary to her statin drug as well.  I am going to recheck her A1c and renal function.  We will obtain her last eye visit.  Continue the metformin and Actos at this time. He did have a normal urine microalbumin this year      Relevant Orders   CBC with Differential/Platelet   Comprehensive metabolic panel   Hemoglobin A1c   HM DIABETES FOOT EXAM (  Completed)   Diabetic neuropathy ([place]) - Primary   Hyperlipidemia    Recheck her cholesterol.  We will likely change her down to pravastatin 80 mg secondary to the myalgias that she has been having with the Crestor.      Relevant Orders   Lipid panel    Other Visit Diagnoses    Need for immunization against influenza       Relevant Orders   Flu Vaccine QUAD 36+ mos IM (Completed)      Note: This dictation was prepared with Dragon dictation along with smaller phrase technology. Any transcriptional errors that result from this process are unintentional.

## 2018-05-04 NOTE — Assessment & Plan Note (Signed)
Longstanding history also runs in her family.  She will continue with the Zoloft she has been on this for 30+ years occasional use of the alprazolam.

## 2018-05-04 NOTE — Assessment & Plan Note (Signed)
Diabetes has been well controlled.  She is monitoring her blood sugars regularly.  She is not on ACE or ARB She would benefit from.  She also does not have hypertension and review of the notations in her blood pressures.  She does have some neuropathy but there is also concerned that she had underlying myalgias secondary to her statin drug as well.  I am going to recheck her A1c and renal function.  We will obtain her last eye visit.  Continue the metformin and Actos at this time. He did have a normal urine microalbumin this year

## 2018-05-04 NOTE — Patient Instructions (Signed)
F/U 4 months for Physical

## 2018-05-05 LAB — HEMOGLOBIN A1C
EAG (MMOL/L): 8.1 (calc)
Hgb A1c MFr Bld: 6.7 % of total Hgb — ABNORMAL HIGH (ref <5.7)
Mean Plasma Glucose: 146 (calc)

## 2018-05-05 LAB — COMPREHENSIVE METABOLIC PANEL
AG Ratio: 1.7 (calc) (ref 1.0–2.5)
ALT: 14 U/L (ref 6–29)
AST: 15 U/L (ref 10–35)
Albumin: 4.5 g/dL (ref 3.6–5.1)
Alkaline phosphatase (APISO): 69 U/L (ref 33–130)
BUN: 17 mg/dL (ref 7–25)
CO2: 27 mmol/L (ref 20–32)
Calcium: 9.8 mg/dL (ref 8.6–10.4)
Chloride: 103 mmol/L (ref 98–110)
Creat: 0.72 mg/dL (ref 0.50–1.05)
GLUCOSE: 115 mg/dL — AB (ref 65–99)
Globulin: 2.6 g/dL (calc) (ref 1.9–3.7)
Potassium: 4.7 mmol/L (ref 3.5–5.3)
Sodium: 139 mmol/L (ref 135–146)
Total Bilirubin: 0.3 mg/dL (ref 0.2–1.2)
Total Protein: 7.1 g/dL (ref 6.1–8.1)

## 2018-05-05 LAB — CBC WITH DIFFERENTIAL/PLATELET
Absolute Monocytes: 806 cells/uL (ref 200–950)
Basophils Absolute: 79 cells/uL (ref 0–200)
Basophils Relative: 1 %
Eosinophils Absolute: 221 cells/uL (ref 15–500)
Eosinophils Relative: 2.8 %
HCT: 37.8 % (ref 35.0–45.0)
Hemoglobin: 12.5 g/dL (ref 11.7–15.5)
Lymphs Abs: 2022 cells/uL (ref 850–3900)
MCH: 28.9 pg (ref 27.0–33.0)
MCHC: 33.1 g/dL (ref 32.0–36.0)
MCV: 87.5 fL (ref 80.0–100.0)
MONOS PCT: 10.2 %
MPV: 10.6 fL (ref 7.5–12.5)
NEUTROS PCT: 60.4 %
Neutro Abs: 4772 cells/uL (ref 1500–7800)
Platelets: 349 10*3/uL (ref 140–400)
RBC: 4.32 10*6/uL (ref 3.80–5.10)
RDW: 13.1 % (ref 11.0–15.0)
TOTAL LYMPHOCYTE: 25.6 %
WBC: 7.9 10*3/uL (ref 3.8–10.8)

## 2018-05-05 LAB — LIPID PANEL
Cholesterol: 190 mg/dL (ref <200)
HDL: 52 mg/dL (ref >50)
LDL Cholesterol (Calc): 112 mg/dL (calc) — ABNORMAL HIGH
Non-HDL Cholesterol (Calc): 138 mg/dL (calc) — ABNORMAL HIGH (ref <130)
Total CHOL/HDL Ratio: 3.7 (calc) (ref <5.0)
Triglycerides: 144 mg/dL (ref <150)

## 2018-05-06 ENCOUNTER — Telehealth: Payer: Self-pay | Admitting: *Deleted

## 2018-05-06 DIAGNOSIS — Z1212 Encounter for screening for malignant neoplasm of rectum: Secondary | ICD-10-CM

## 2018-05-06 DIAGNOSIS — R195 Other fecal abnormalities: Secondary | ICD-10-CM

## 2018-05-06 DIAGNOSIS — Z1211 Encounter for screening for malignant neoplasm of colon: Secondary | ICD-10-CM

## 2018-05-06 NOTE — Telephone Encounter (Signed)
Received verbal orders for Cologuard.   Order placed via [facility].   Cologuard (Order [id number])

## 2018-05-10 ENCOUNTER — Other Ambulatory Visit: Payer: Self-pay | Admitting: *Deleted

## 2018-05-10 MED ORDER — PRAVASTATIN SODIUM 80 MG PO TABS: 80.0000 mg | ORAL_TABLET | Freq: Every day | ORAL | 3 refills | Status: DC

## 2018-05-11 ENCOUNTER — Other Ambulatory Visit: Payer: Self-pay | Admitting: *Deleted

## 2018-05-11 MED ORDER — METFORMIN HCL 1000 MG PO TABS: 1000.0000 mg | ORAL_TABLET | Freq: Two times a day (BID) | ORAL | 0 refills | Status: DC

## 2018-05-12 ENCOUNTER — Encounter: Payer: Self-pay | Admitting: Family Medicine

## 2018-05-12 DIAGNOSIS — Z1212 Encounter for screening for malignant neoplasm of rectum: Secondary | ICD-10-CM | POA: Diagnosis not present

## 2018-05-12 DIAGNOSIS — Z1211 Encounter for screening for malignant neoplasm of colon: Secondary | ICD-10-CM | POA: Diagnosis not present

## 2018-05-13 ENCOUNTER — Encounter: Payer: Self-pay | Admitting: *Deleted

## 2018-05-14 LAB — COLOGUARD: Cologuard: POSITIVE — AB

## 2018-05-21 NOTE — Addendum Note (Signed)
Addended by: Sheral Flow on: 05/21/2018 12:37 PM   Modules accepted: Orders

## 2018-05-21 NOTE — Telephone Encounter (Signed)
Received Cologuard results.   Noted positive.   Referral to GI for colonoscopy placed.   Call placed to patient and patient made aware.

## 2018-06-01 DIAGNOSIS — R195 Other fecal abnormalities: Secondary | ICD-10-CM | POA: Diagnosis not present

## 2018-06-01 DIAGNOSIS — Z1211 Encounter for screening for malignant neoplasm of colon: Secondary | ICD-10-CM | POA: Diagnosis not present

## 2018-06-01 DIAGNOSIS — K5190 Ulcerative colitis, unspecified, without complications: Secondary | ICD-10-CM | POA: Diagnosis not present

## 2018-07-10 ENCOUNTER — Other Ambulatory Visit: Payer: Self-pay | Admitting: Family Medicine

## 2018-07-14 ENCOUNTER — Other Ambulatory Visit: Payer: Self-pay | Admitting: Family Medicine

## 2018-07-19 DIAGNOSIS — K529 Noninfective gastroenteritis and colitis, unspecified: Secondary | ICD-10-CM | POA: Diagnosis not present

## 2018-07-19 DIAGNOSIS — K633 Ulcer of intestine: Secondary | ICD-10-CM | POA: Diagnosis not present

## 2018-07-19 DIAGNOSIS — Z1211 Encounter for screening for malignant neoplasm of colon: Secondary | ICD-10-CM | POA: Diagnosis not present

## 2018-07-19 DIAGNOSIS — R195 Other fecal abnormalities: Secondary | ICD-10-CM | POA: Diagnosis not present

## 2018-07-19 DIAGNOSIS — K6389 Other specified diseases of intestine: Secondary | ICD-10-CM | POA: Diagnosis not present

## 2018-07-19 LAB — HM COLONOSCOPY

## 2018-07-26 ENCOUNTER — Encounter: Payer: Self-pay | Admitting: *Deleted

## 2018-07-27 ENCOUNTER — Other Ambulatory Visit: Payer: Self-pay | Admitting: Family Medicine

## 2018-08-05 ENCOUNTER — Other Ambulatory Visit: Payer: Self-pay | Admitting: Family Medicine

## 2018-08-05 ENCOUNTER — Encounter: Payer: Self-pay | Admitting: *Deleted

## 2018-08-31 DIAGNOSIS — K5190 Ulcerative colitis, unspecified, without complications: Secondary | ICD-10-CM | POA: Diagnosis not present

## 2018-09-03 ENCOUNTER — Encounter: Payer: Commercial Managed Care - PPO | Admitting: Family Medicine

## 2018-09-24 ENCOUNTER — Other Ambulatory Visit: Payer: Self-pay

## 2018-09-24 ENCOUNTER — Ambulatory Visit (INDEPENDENT_AMBULATORY_CARE_PROVIDER_SITE_OTHER): Payer: Commercial Managed Care - PPO | Admitting: Family Medicine

## 2018-09-24 VITALS — BP 118/62 | HR 90 | Temp 98.6°F | Resp 16 | Ht 66.0 in | Wt 231.0 lb

## 2018-09-24 DIAGNOSIS — Z23 Encounter for immunization: Secondary | ICD-10-CM | POA: Diagnosis not present

## 2018-09-24 DIAGNOSIS — K51919 Ulcerative colitis, unspecified with unspecified complications: Secondary | ICD-10-CM

## 2018-09-24 DIAGNOSIS — E0841 Diabetes mellitus due to underlying condition with diabetic mononeuropathy: Secondary | ICD-10-CM

## 2018-09-24 DIAGNOSIS — E785 Hyperlipidemia, unspecified: Secondary | ICD-10-CM

## 2018-09-24 DIAGNOSIS — E118 Type 2 diabetes mellitus with unspecified complications: Secondary | ICD-10-CM | POA: Diagnosis not present

## 2018-09-24 DIAGNOSIS — Z0000 Encounter for general adult medical examination without abnormal findings: Secondary | ICD-10-CM

## 2018-09-24 DIAGNOSIS — Z0001 Encounter for general adult medical examination with abnormal findings: Secondary | ICD-10-CM | POA: Diagnosis not present

## 2018-09-24 DIAGNOSIS — R5383 Other fatigue: Secondary | ICD-10-CM

## 2018-09-24 DIAGNOSIS — F419 Anxiety disorder, unspecified: Secondary | ICD-10-CM

## 2018-09-24 MED ORDER — MECLIZINE HCL 12.5 MG PO TABS: 12.5000 mg | ORAL_TABLET | Freq: Three times a day (TID) | ORAL | 0 refills | Status: DC | PRN

## 2018-09-24 NOTE — Patient Instructions (Addendum)
F/U 4 months  Try anti-histamine like claritin or zyrtec for 2 weeks Meclizine for severe dizzy spell Call back if this does not help

## 2018-09-24 NOTE — Progress Notes (Signed)
Subjective:    Patient ID: Kimberly Solis, female    DOB: 03/05/1961, 58 y.o.   MRN: [id number]  Patient presents for Annual Exam Patient here for complete physical exam and to follow-up chronic medical problems.   Mammogram up-to-date PAP Smear up-to-date  Positive Cologuard in January 2020 she was seen by gastroenterology had a colonoscopy in March 2020 this is to be repeated in 5 years.  Diagnosed with Crohns recently after her colonoscopy started on Azathioprine 70m followed by her hematologist.  She does need a CBC with her fasting labs as a baseline.  Note she was placed on Mesalamine   Hep B vaccine series also needed she does not have immunity based on GI tests  DM-A1c 6.7% currently on metformin at thousand milligrams twice a day as well as Actos 45 mg daily her CBGs run 130-140's  Hyperipidemia her pravastatin was changed to 80 mg at bedtime back in January and she was having leg cramps.  Gets a fluttering in her left ear, along with a pounding like sound, when she moves her head a certain way gets dizzy. IF she exerting herself and very busy will have a spell   Tried anti- histamine with no improvement   [facility]   provides her with progesterone and estradiol    Anxiety she has been maintained on Zoloft 100 mg and as needed Xanax 0.5 mg which she rarely uses.  Immunizations tetanus pneumonia vaccine up-to-date.  Discussed shingles vaccine- hold off for now   Review Of Systems:  GEN- denies fatigue, fever, weight loss,weakness, recent illness HEENT- denies eye drainage, change in vision, nasal discharge, CVS- denies chest pain, palpitations RESP- denies SOB, cough, wheeze ABD- denies N/V, change in stools, abd pain GU- denies dysuria, hematuria, dribbling, incontinence MSK- denies joint pain, muscle aches, injury Neuro- denies headache, dizziness, syncope, seizure activity       Objective:    BP 118/62   Pulse 90   Temp  98.6 F (37 C)   Resp 16   Ht 5' 6"  (1.676 m)   Wt 231 lb (104.8 kg)   LMP  (LMP Unknown)   SpO2 95%   BMI 37.28 kg/m  GEN- NAD, alert and oriented x3 ,weight down 6lbs since December 2019 HEENT- PERRL, EOMI, non injected sclera, pink conjunctiva, MMM, oropharynx clear Neck- Supple, no thyromegaly CVS- RRR, no murmur RESP-CTAB ABD-NABS,soft,NT,ND EXT- No edema Pulses- Radial, DP- 2+        Assessment & Plan:      Problem List Items Addressed This Visit      Unprioritized   Anxiety    Continue current meds, longstanding regimen for her      Diabetes mellitus type 2 with complications ([place]    Has been well controlled, recheck labs Goal A1C < 7%      Relevant Orders   Hemoglobin A1c (Completed)   Diabetic neuropathy (HCC)   Hyperlipidemia    Check lipids on statin drug       Relevant Orders   Lipid panel (Completed)   Ulcerative colitis ([place]    Check CBC, also given HEP A/B vaccine, we do not have Hep B alone, discussed this with patient       Other Visit Diagnoses    Annual physical exam    -  Primary   CPE done, fasting labs, will repeat HEP A/B vaccine via protocal   Relevant Orders   CBC with Differential/Platelet (Completed)   Hemoglobin  A1c (Completed)   Lipid panel (Completed)   Comprehensive metabolic panel (Completed)   Fatigue, unspecified type       Relevant Orders   TSH (Completed)   Need for vaccination       Relevant Orders   Hepatitis A hepatitis B combined vaccine IM (Completed)      Note: This dictation was prepared with Dragon dictation along with smaller phrase technology. Any transcriptional errors that result from this process are unintentional.

## 2018-09-25 LAB — LIPID PANEL
Cholesterol: 204 mg/dL — ABNORMAL HIGH (ref <200)
HDL: 51 mg/dL (ref >=50)
LDL Cholesterol (Calc): 125 mg/dL (calc) — ABNORMAL HIGH
Non-HDL Cholesterol (Calc): 153 mg/dL (calc) — ABNORMAL HIGH (ref <130)
Total CHOL/HDL Ratio: 4 (calc) (ref <5.0)
Triglycerides: 167 mg/dL — ABNORMAL HIGH (ref <150)

## 2018-09-25 LAB — CBC WITH DIFFERENTIAL/PLATELET
Absolute Monocytes: 689 cells/uL (ref 200–950)
Basophils Absolute: 60 cells/uL (ref 0–200)
Basophils Relative: 0.7 %
Eosinophils Absolute: 179 cells/uL (ref 15–500)
Eosinophils Relative: 2.1 %
HCT: 38.4 % (ref 35.0–45.0)
Hemoglobin: 12.5 g/dL (ref 11.7–15.5)
Lymphs Abs: 1828 cells/uL (ref 850–3900)
MCH: 27.8 pg (ref 27.0–33.0)
MCHC: 32.6 g/dL (ref 32.0–36.0)
MCV: 85.5 fL (ref 80.0–100.0)
MPV: 10.4 fL (ref 7.5–12.5)
Monocytes Relative: 8.1 %
Neutro Abs: 5746 cells/uL (ref 1500–7800)
Neutrophils Relative %: 67.6 %
Platelets: 428 10*3/uL — ABNORMAL HIGH (ref 140–400)
RBC: 4.49 10*6/uL (ref 3.80–5.10)
RDW: 13.3 % (ref 11.0–15.0)
Total Lymphocyte: 21.5 %
WBC: 8.5 10*3/uL (ref 3.8–10.8)

## 2018-09-25 LAB — COMPREHENSIVE METABOLIC PANEL
AG Ratio: 1.6 (calc) (ref 1.0–2.5)
ALT: 14 U/L (ref 6–29)
AST: 19 U/L (ref 10–35)
Albumin: 4.6 g/dL (ref 3.6–5.1)
Alkaline phosphatase (APISO): 75 U/L (ref 37–153)
BUN: 13 mg/dL (ref 7–25)
CO2: 27 mmol/L (ref 20–32)
Calcium: 10.1 mg/dL (ref 8.6–10.4)
Chloride: 99 mmol/L (ref 98–110)
Creat: 0.88 mg/dL (ref 0.50–1.05)
Globulin: 2.9 g/dL (calc) (ref 1.9–3.7)
Glucose, Bld: 157 mg/dL — ABNORMAL HIGH (ref 65–99)
Potassium: 4.8 mmol/L (ref 3.5–5.3)
Sodium: 138 mmol/L (ref 135–146)
Total Bilirubin: 0.4 mg/dL (ref 0.2–1.2)
Total Protein: 7.5 g/dL (ref 6.1–8.1)

## 2018-09-25 LAB — TSH: TSH: 1.42 mIU/L (ref 0.40–4.50)

## 2018-09-25 LAB — HEMOGLOBIN A1C
Hgb A1c MFr Bld: 6.9 % of total Hgb — ABNORMAL HIGH (ref <5.7)
Mean Plasma Glucose: 151 (calc)
eAG (mmol/L): 8.4 (calc)

## 2018-09-26 ENCOUNTER — Encounter: Payer: Self-pay | Admitting: Family Medicine

## 2018-09-26 NOTE — Assessment & Plan Note (Signed)
Check lipids on statin drug

## 2018-09-26 NOTE — Assessment & Plan Note (Signed)
Check CBC, also given HEP A/B vaccine, we do not have Hep B alone, discussed this with patient

## 2018-09-26 NOTE — Assessment & Plan Note (Signed)
Continue current meds, longstanding regimen for her

## 2018-09-26 NOTE — Assessment & Plan Note (Signed)
Has been well controlled, recheck labs Goal A1C < 7%

## 2018-09-29 ENCOUNTER — Telehealth: Payer: Self-pay

## 2018-09-29 NOTE — Telephone Encounter (Signed)
Error

## 2018-10-06 ENCOUNTER — Other Ambulatory Visit: Payer: Self-pay | Admitting: Family Medicine

## 2018-10-06 MED ORDER — SERTRALINE HCL 100 MG PO TABS: 100.0000 mg | ORAL_TABLET | Freq: Every day | ORAL | 0 refills | Status: DC

## 2018-10-06 NOTE — Telephone Encounter (Signed)
Requested Prescriptions   Pending Prescriptions Disp Refills  . sertraline (ZOLOFT) 100 MG tablet [Pharmacy Med Name: SERTRALINE HCL 100 MG TABLET] 90 tablet 0    Sig: TAKE 1 TABLET BY MOUTH EVERY DAY   Signed Prescriptions Disp Refills  . pioglitazone (ACTOS) 45 MG tablet 90 tablet 0    Sig: TAKE 1 TABLET BY MOUTH EVERY DAY    Authorizing Provider: Vic Blackbird F    Ordering User: Vanice Sarah

## 2018-10-06 NOTE — Telephone Encounter (Signed)
Requested Prescriptions   Pending Prescriptions Disp Refills  . sertraline (ZOLOFT) 100 MG tablet 90 tablet 0    Sig: Take 1 tablet (100 mg total) by mouth daily.   Signed Prescriptions Disp Refills  . sertraline (ZOLOFT) 100 MG tablet 90 tablet 0    Sig: TAKE 1 TABLET BY MOUTH EVERY DAY    Authorizing Provider: Alycia Rossetti    Ordering User: Karelyn Brisby C  . pioglitazone (ACTOS) 45 MG tablet 90 tablet 0    Sig: TAKE 1 TABLET BY MOUTH EVERY DAY    Authorizing Provider: Vic Blackbird F    Ordering User: Vanice Sarah

## 2018-10-29 ENCOUNTER — Other Ambulatory Visit: Payer: Self-pay

## 2018-10-29 ENCOUNTER — Ambulatory Visit (INDEPENDENT_AMBULATORY_CARE_PROVIDER_SITE_OTHER): Payer: Commercial Managed Care - PPO

## 2018-10-29 DIAGNOSIS — Z23 Encounter for immunization: Secondary | ICD-10-CM

## 2018-10-29 NOTE — Progress Notes (Signed)
Twinrix was given to pt in R deltoid. Pt tolerated well.

## 2018-11-04 ENCOUNTER — Other Ambulatory Visit: Payer: Self-pay | Admitting: Family Medicine

## 2018-11-29 ENCOUNTER — Other Ambulatory Visit: Payer: Self-pay | Admitting: Family Medicine

## 2018-11-29 DIAGNOSIS — IMO0002 Reserved for concepts with insufficient information to code with codable children: Secondary | ICD-10-CM

## 2018-11-29 DIAGNOSIS — E1165 Type 2 diabetes mellitus with hyperglycemia: Secondary | ICD-10-CM

## 2018-11-30 ENCOUNTER — Other Ambulatory Visit: Payer: Self-pay | Admitting: *Deleted

## 2018-11-30 MED ORDER — LANCETS MISC: 1 refills | Status: DC

## 2018-11-30 MED ORDER — BLOOD GLUCOSE TEST VI STRP: ORAL_STRIP | 1 refills | Status: DC

## 2018-11-30 MED ORDER — BLOOD GLUCOSE SYSTEM PAK KIT: PACK | 1 refills | Status: DC

## 2018-12-29 ENCOUNTER — Other Ambulatory Visit: Payer: Self-pay | Admitting: Family Medicine

## 2019-01-24 ENCOUNTER — Other Ambulatory Visit: Payer: Self-pay

## 2019-01-25 ENCOUNTER — Encounter: Payer: Self-pay | Admitting: Family Medicine

## 2019-01-25 ENCOUNTER — Ambulatory Visit (INDEPENDENT_AMBULATORY_CARE_PROVIDER_SITE_OTHER): Payer: Commercial Managed Care - PPO | Admitting: Family Medicine

## 2019-01-25 VITALS — BP 124/66 | HR 82 | Temp 98.3°F | Resp 14 | Ht 66.0 in | Wt 236.0 lb

## 2019-01-25 DIAGNOSIS — Z23 Encounter for immunization: Secondary | ICD-10-CM

## 2019-01-25 DIAGNOSIS — E785 Hyperlipidemia, unspecified: Secondary | ICD-10-CM

## 2019-01-25 DIAGNOSIS — E118 Type 2 diabetes mellitus with unspecified complications: Secondary | ICD-10-CM

## 2019-01-25 DIAGNOSIS — E0841 Diabetes mellitus due to underlying condition with diabetic mononeuropathy: Secondary | ICD-10-CM

## 2019-01-25 DIAGNOSIS — E669 Obesity, unspecified: Secondary | ICD-10-CM | POA: Diagnosis not present

## 2019-01-25 NOTE — Assessment & Plan Note (Signed)
Diabetes has been fairly well controlled  no change to meds  Goal A1C < 7% Plan to start lisinopril 2.59m for renal protection based on BMET and urine micro

## 2019-01-25 NOTE — Patient Instructions (Addendum)
You can schedule mammogram after Nov 28th  FLu shot given I will plan to start lisinopril 2.47m once a day  F/U 4 months

## 2019-01-25 NOTE — Assessment & Plan Note (Signed)
On statin drug, she admits she indulged some this past few months Pravastatin, check LFT

## 2019-01-25 NOTE — Progress Notes (Signed)
   Subjective:    Patient ID: Kimberly Solis, female    DOB: Apr 01, 1961, 58 y.o.   MRN: [id number]  Patient presents for Follow-up (is fasting) Patient here to follow-up chronic medical problems.  Medications reviewed.  Diabetes mellitus last A1c was 6.9% in May , currently on metformin and Actos, CBGs on average from 135- 178 fasting, no hypoglycemia  Hyperlipidemia currently taking pravastatin without difficulties, I had recommended lipitor but she declined , LDL was 125 , TG 167 in May due for repeat labs today   Generalized anxiety she is on Zoloft 100 mg and Xanax 0.5 mg as needed  Weight up 5lbs since May   She is still Humira  She is [facility] progesterone and [facility]  Review Of Systems:  GEN- denies fatigue, fever, weight loss,weakness, recent illness HEENT- denies eye drainage, change in vision, nasal discharge, CVS- denies chest pain, palpitations RESP- denies SOB, cough, wheeze ABD- denies N/V, change in stools, abd pain GU- denies dysuria, hematuria, dribbling, incontinence MSK- denies joint pain, muscle aches, injury Neuro- denies headache, dizziness, syncope, seizure activity       Objective:    BP 124/66   Pulse 82   Temp 98.3 F (36.8 C) (Oral)   Resp 14   Ht 5' 6"  (1.676 m)   Wt 236 lb (107 kg)   LMP  (LMP Unknown)   SpO2 98%   BMI 38.09 kg/m  GEN- NAD, alert and oriented x3 HEENT- PERRL, EOMI, non injected sclera, pink conjunctiva, MMM, oropharynx clear Neck- Supple, no thyromegaly CVS- RRR, no murmur RESP-CTAB EXT- No edema Pulses- Radial, DP- 2+        Assessment & Plan:      Problem List Items Addressed This Visit      Unprioritized   Diabetes mellitus type 2 with complications ([place]) - Primary    Diabetes has been fairly well controlled  no change to meds  Goal A1C < 7% Plan to start lisinopril 2.18m for renal protection based on BMET and urine micro      Relevant Orders   CBC with Differential/Platelet   Comprehensive metabolic panel   Hemoglobin A1c   Microalbumin / creatinine urine ratio   Diabetic neuropathy (HCC)   Hyperlipidemia    On statin drug, she admits she indulged some this past few months Pravastatin, check LFT      Relevant Orders   Lipid panel   Obesity (BMI 30-39.9)    Work on dietary changes       Other Visit Diagnoses    Need for immunization against influenza       Relevant Orders   Flu Vaccine QUAD 36+ mos IM (Completed)      Note: This dictation was prepared with Dragon dictation along with smaller phrase technology. Any transcriptional errors that result from this process are unintentional.

## 2019-01-25 NOTE — Assessment & Plan Note (Signed)
Work on dietary changes

## 2019-01-26 LAB — COMPREHENSIVE METABOLIC PANEL
AG Ratio: 1.8 (calc) (ref 1.0–2.5)
ALT: 14 U/L (ref 6–29)
AST: 16 U/L (ref 10–35)
Albumin: 4.6 g/dL (ref 3.6–5.1)
Alkaline phosphatase (APISO): 56 U/L (ref 37–153)
BUN: 14 mg/dL (ref 7–25)
CO2: 24 mmol/L (ref 20–32)
Calcium: 9.6 mg/dL (ref 8.6–10.4)
Chloride: 106 mmol/L (ref 98–110)
Creat: 0.79 mg/dL (ref 0.50–1.05)
Globulin: 2.5 g/dL (calc) (ref 1.9–3.7)
Glucose, Bld: 133 mg/dL — ABNORMAL HIGH (ref 65–99)
Potassium: 4.7 mmol/L (ref 3.5–5.3)
Sodium: 142 mmol/L (ref 135–146)
Total Bilirubin: 0.3 mg/dL (ref 0.2–1.2)
Total Protein: 7.1 g/dL (ref 6.1–8.1)

## 2019-01-26 LAB — CBC WITH DIFFERENTIAL/PLATELET
Absolute Monocytes: 662 cells/uL (ref 200–950)
Basophils Absolute: 50 cells/uL (ref 0–200)
Basophils Relative: 0.8 %
Eosinophils Absolute: 132 cells/uL (ref 15–500)
Eosinophils Relative: 2.1 %
HCT: 40.4 % (ref 35.0–45.0)
Hemoglobin: 13 g/dL (ref 11.7–15.5)
Lymphs Abs: 2035 cells/uL (ref 850–3900)
MCH: 28.3 pg (ref 27.0–33.0)
MCHC: 32.2 g/dL (ref 32.0–36.0)
MCV: 88 fL (ref 80.0–100.0)
MPV: 10.6 fL (ref 7.5–12.5)
Monocytes Relative: 10.5 %
Neutro Abs: 3421 cells/uL (ref 1500–7800)
Neutrophils Relative %: 54.3 %
Platelets: 357 10*3/uL (ref 140–400)
RBC: 4.59 10*6/uL (ref 3.80–5.10)
RDW: 13.9 % (ref 11.0–15.0)
Total Lymphocyte: 32.3 %
WBC: 6.3 10*3/uL (ref 3.8–10.8)

## 2019-01-26 LAB — LIPID PANEL
Cholesterol: 232 mg/dL — ABNORMAL HIGH (ref <200)
HDL: 62 mg/dL (ref >=50)
LDL Cholesterol (Calc): 145 mg/dL (calc) — ABNORMAL HIGH
Non-HDL Cholesterol (Calc): 170 mg/dL (calc) — ABNORMAL HIGH (ref <130)
Total CHOL/HDL Ratio: 3.7 (calc) (ref <5.0)
Triglycerides: 127 mg/dL (ref <150)

## 2019-01-26 LAB — MICROALBUMIN / CREATININE URINE RATIO
Creatinine, Urine: 171 mg/dL (ref 20–275)
Microalb Creat Ratio: 4 mcg/mg creat (ref <30)
Microalb, Ur: 0.7 mg/dL

## 2019-01-26 LAB — HEMOGLOBIN A1C
Hgb A1c MFr Bld: 6.8 % of total Hgb — ABNORMAL HIGH (ref <5.7)
Mean Plasma Glucose: 148 (calc)
eAG (mmol/L): 8.2 (calc)

## 2019-01-27 ENCOUNTER — Encounter: Payer: Self-pay | Admitting: *Deleted

## 2019-01-28 ENCOUNTER — Other Ambulatory Visit: Payer: Self-pay | Admitting: *Deleted

## 2019-01-28 MED ORDER — ATORVASTATIN CALCIUM 40 MG PO TABS: 40.0000 mg | ORAL_TABLET | Freq: Every day | ORAL | 3 refills | Status: DC

## 2019-01-28 MED ORDER — LISINOPRIL 2.5 MG PO TABS: 2.5000 mg | ORAL_TABLET | Freq: Every day | ORAL | 1 refills | Status: DC

## 2019-02-23 ENCOUNTER — Telehealth: Payer: Self-pay | Admitting: *Deleted

## 2019-02-23 NOTE — Telephone Encounter (Signed)
Received call from patient.   States that she has received summons for [employer]. Reports that she has been started on Humira for ulcerative colitis. States that she is concerned about serving on jury while using the immunosuppressant during pandemic.   Requested MD to excuse her from jury duty. States that she will drop off form next week.

## 2019-02-23 NOTE — Telephone Encounter (Signed)
Agree, pt can be excused from jury duty

## 2019-02-25 ENCOUNTER — Other Ambulatory Visit: Payer: Self-pay | Admitting: Family Medicine

## 2019-03-01 ENCOUNTER — Other Ambulatory Visit: Payer: Self-pay | Admitting: Family Medicine

## 2019-03-01 NOTE — Telephone Encounter (Signed)
Letter written and awaiting signature.

## 2019-03-07 ENCOUNTER — Other Ambulatory Visit: Payer: Self-pay | Admitting: Family Medicine

## 2019-03-08 ENCOUNTER — Other Ambulatory Visit: Payer: Self-pay | Admitting: Family Medicine

## 2019-03-16 ENCOUNTER — Other Ambulatory Visit: Payer: Self-pay | Admitting: Family Medicine

## 2019-03-16 DIAGNOSIS — Z1231 Encounter for screening mammogram for malignant neoplasm of breast: Secondary | ICD-10-CM

## 2019-03-22 LAB — HM DIABETES EYE EXAM

## 2019-03-30 ENCOUNTER — Other Ambulatory Visit: Payer: Self-pay | Admitting: Family Medicine

## 2019-04-04 ENCOUNTER — Ambulatory Visit: Payer: Commercial Managed Care - PPO

## 2019-04-08 ENCOUNTER — Other Ambulatory Visit: Payer: Self-pay

## 2019-04-08 ENCOUNTER — Ambulatory Visit (INDEPENDENT_AMBULATORY_CARE_PROVIDER_SITE_OTHER): Payer: Commercial Managed Care - PPO

## 2019-04-08 DIAGNOSIS — Z23 Encounter for immunization: Secondary | ICD-10-CM | POA: Diagnosis not present

## 2019-04-09 ENCOUNTER — Other Ambulatory Visit: Payer: Self-pay | Admitting: Family Medicine

## 2019-04-25 ENCOUNTER — Other Ambulatory Visit: Payer: Self-pay | Admitting: Family Medicine

## 2019-05-12 ENCOUNTER — Ambulatory Visit: Payer: Commercial Managed Care - PPO

## 2019-05-21 ENCOUNTER — Other Ambulatory Visit: Payer: Self-pay | Admitting: Family Medicine

## 2019-05-24 ENCOUNTER — Other Ambulatory Visit: Payer: Self-pay | Admitting: Family Medicine

## 2019-05-30 ENCOUNTER — Ambulatory Visit (INDEPENDENT_AMBULATORY_CARE_PROVIDER_SITE_OTHER): Payer: Commercial Managed Care - PPO | Admitting: Family Medicine

## 2019-05-30 ENCOUNTER — Encounter: Payer: Self-pay | Admitting: Family Medicine

## 2019-05-30 ENCOUNTER — Other Ambulatory Visit: Payer: Self-pay | Admitting: Family Medicine

## 2019-05-30 ENCOUNTER — Other Ambulatory Visit: Payer: Self-pay

## 2019-05-30 VITALS — BP 124/80 | HR 77 | Temp 97.7°F | Resp 15 | Ht 66.0 in | Wt 241.4 lb

## 2019-05-30 DIAGNOSIS — F419 Anxiety disorder, unspecified: Secondary | ICD-10-CM

## 2019-05-30 DIAGNOSIS — E669 Obesity, unspecified: Secondary | ICD-10-CM

## 2019-05-30 DIAGNOSIS — E785 Hyperlipidemia, unspecified: Secondary | ICD-10-CM

## 2019-05-30 DIAGNOSIS — E0841 Diabetes mellitus due to underlying condition with diabetic mononeuropathy: Secondary | ICD-10-CM | POA: Diagnosis not present

## 2019-05-30 DIAGNOSIS — E118 Type 2 diabetes mellitus with unspecified complications: Secondary | ICD-10-CM

## 2019-05-30 DIAGNOSIS — N951 Menopausal and female climacteric states: Secondary | ICD-10-CM

## 2019-05-30 DIAGNOSIS — K51919 Ulcerative colitis, unspecified with unspecified complications: Secondary | ICD-10-CM

## 2019-05-30 MED ORDER — ALPRAZOLAM 0.5 MG PO TABS: 0.5000 mg | ORAL_TABLET | Freq: Two times a day (BID) | ORAL | 2 refills | Status: DC | PRN

## 2019-05-30 NOTE — Assessment & Plan Note (Addendum)
Continue HUmira per GI She did have some concerns about Covid vaccine when it was her time to get it.  Question we do not have a lot of information regarding immunosuppressants and the COVID-19 vaccine.  I did advise that due to the rule out program I think it is okay for her to wait until more vaccinated even some who are on immunosuppressive's to see what the research shows.  Unfortunately due to her age range I think will be quite some time before she is vaccinated.

## 2019-05-30 NOTE — Patient Instructions (Signed)
F/U 4 months for Physical We will call with lab results

## 2019-05-30 NOTE — Assessment & Plan Note (Signed)
Hormones per GYN, has appt this Thursday

## 2019-05-30 NOTE — Progress Notes (Signed)
Subjective:    Patient ID: Kimberly Solis, female    DOB: 04/21/1961, 59 y.o.   MRN: [id number]  Patient presents for Diabetes   Pt here to f/u chronic medical problems  Medications reviewed  DM- last A1C 6.8% , she is currently on Metformin 1026m BID,  actos  419mdaily   lisiniopril 2.53m84mnce a day  weight up 5lbs since last visit , she states she had gained 10lbs now down 5 of those   she admits she has not been as active, but now watching grandson which makes her more active and moving around   CBG typically 130-160 fasting , no hypoglycemia   Hyperlipidemia- lipitor 21m28m bedtime   GAD- taking zoloft, xanax as needed - needs refill   GYN- on progesteron and minivelle patch / PAP Smear UTD/ Mammogram Due - [facility]   UC- she is on humira injections , Dr. MannCollene MaresReview Of Systems:  GEN- denies fatigue, fever, weight loss,weakness, recent illness HEENT- denies eye drainage, change in vision, nasal discharge, CVS- denies chest pain, palpitations RESP- denies SOB, cough, wheeze ABD- denies N/V, change in stools, abd pain GU- denies dysuria, hematuria, dribbling, incontinence MSK- denies joint pain, muscle aches, injury Neuro- denies headache, dizziness, syncope, seizure activity       Objective:    BP 124/80   Pulse 77   Temp 97.7 F (36.5 C) (Temporal)   Resp 15   Ht 5' 6"  (1.676 m)   Wt 241 lb 6 oz (109.5 kg)   LMP  (LMP Unknown)   SpO2 99%   BMI 38.96 kg/m  GEN- NAD, alert and oriented x3 HEENT- PERRL, EOMI, non injected sclera, pink conjunctiva, MMM, oropharynx clear Neck- Supple, no thyromegaly CVS- RRR, no murmur RESP-CTAB ABD-NABS,soft,NT,ND EXT- No edema Pulses- Radial, DP- 2+        Assessment & Plan:      Problem List Items Addressed This Visit      Unprioritized   Anxiety    zoloft continue, xanax prn especially at bedtime  Refilled today      Relevant Medications   ALPRAZolam (XANAX) 0.5 MG tablet   Diabetes mellitus type 2 with complications (HCC) - Primary    Diabetes has been controlled with oral medications.  She is working on dietary changes trying to get her weight back down to a healthier weight.  No change in her current medication she is on low-dose ACE inhibitor for renal protection on statin drug.  We will recheck her lipid panel goal is LDL less than 100 A1c less than 7%.      Relevant Orders   CBC with Differential/Platelet   Comprehensive metabolic panel   Hemoglobin A1c   Diabetic neuropathy (HCC)   Hyperlipidemia   Relevant Orders   Lipid panel   Obesity (BMI 30-39.9)   Post menopausal syndrome    Hormones per GYN, has appt this Thursday       Ulcerative colitis (HCC)[place] Continue HUmira per GI She did have some concerns about Covid vaccine when it was her time to get it.  Question we do not have a lot of information regarding immunosuppressants and the COVID-19 vaccine.  I did advise that due to the rule out program I think it is okay for her to wait until more vaccinated even some who are on immunosuppressive's to see what the research shows.  Unfortunately due to her age range I think will be  quite some time before she is vaccinated.         Note: This dictation was prepared with Dragon dictation along with smaller phrase technology. Any transcriptional errors that result from this process are unintentional.

## 2019-05-30 NOTE — Assessment & Plan Note (Signed)
Diabetes has been controlled with oral medications.  She is working on dietary changes trying to get her weight back down to a healthier weight.  No change in her current medication she is on low-dose ACE inhibitor for renal protection on statin drug.  We will recheck her lipid panel goal is LDL less than 100 A1c less than 7%.

## 2019-05-30 NOTE — Assessment & Plan Note (Signed)
zoloft continue, xanax prn especially at bedtime  Refilled today

## 2019-05-31 LAB — COMPREHENSIVE METABOLIC PANEL
AG Ratio: 1.8 (calc) (ref 1.0–2.5)
ALT: 14 U/L (ref 6–29)
AST: 16 U/L (ref 10–35)
Albumin: 4.6 g/dL (ref 3.6–5.1)
Alkaline phosphatase (APISO): 69 U/L (ref 37–153)
BUN: 15 mg/dL (ref 7–25)
CO2: 26 mmol/L (ref 20–32)
Calcium: 9.7 mg/dL (ref 8.6–10.4)
Chloride: 102 mmol/L (ref 98–110)
Creat: 0.77 mg/dL (ref 0.50–1.05)
Globulin: 2.6 g/dL (calc) (ref 1.9–3.7)
Glucose, Bld: 129 mg/dL — ABNORMAL HIGH (ref 65–99)
Potassium: 4.7 mmol/L (ref 3.5–5.3)
Sodium: 139 mmol/L (ref 135–146)
Total Bilirubin: 0.3 mg/dL (ref 0.2–1.2)
Total Protein: 7.2 g/dL (ref 6.1–8.1)

## 2019-05-31 LAB — CBC WITH DIFFERENTIAL/PLATELET
Absolute Monocytes: 682 cells/uL (ref 200–950)
Basophils Absolute: 50 cells/uL (ref 0–200)
Basophils Relative: 0.7 %
Eosinophils Absolute: 128 cells/uL (ref 15–500)
Eosinophils Relative: 1.8 %
HCT: 39.9 % (ref 35.0–45.0)
Hemoglobin: 13.2 g/dL (ref 11.7–15.5)
Lymphs Abs: 2464 cells/uL (ref 850–3900)
MCH: 30.1 pg (ref 27.0–33.0)
MCHC: 33.1 g/dL (ref 32.0–36.0)
MCV: 91.1 fL (ref 80.0–100.0)
MPV: 10.7 fL (ref 7.5–12.5)
Monocytes Relative: 9.6 %
Neutro Abs: 3777 cells/uL (ref 1500–7800)
Neutrophils Relative %: 53.2 %
Platelets: 354 10*3/uL (ref 140–400)
RBC: 4.38 10*6/uL (ref 3.80–5.10)
RDW: 12.7 % (ref 11.0–15.0)
Total Lymphocyte: 34.7 %
WBC: 7.1 10*3/uL (ref 3.8–10.8)

## 2019-05-31 LAB — LIPID PANEL
Cholesterol: 201 mg/dL — ABNORMAL HIGH (ref <200)
HDL: 59 mg/dL (ref >=50)
LDL Cholesterol (Calc): 116 mg/dL (calc) — ABNORMAL HIGH
Non-HDL Cholesterol (Calc): 142 mg/dL (calc) — ABNORMAL HIGH (ref <130)
Total CHOL/HDL Ratio: 3.4 (calc) (ref <5.0)
Triglycerides: 143 mg/dL (ref <150)

## 2019-05-31 LAB — HEMOGLOBIN A1C
Hgb A1c MFr Bld: 6.9 % of total Hgb — ABNORMAL HIGH (ref <5.7)
Mean Plasma Glucose: 151 (calc)
eAG (mmol/L): 8.4 (calc)

## 2019-06-02 ENCOUNTER — Encounter: Payer: Self-pay | Admitting: *Deleted

## 2019-06-15 ENCOUNTER — Other Ambulatory Visit: Payer: Self-pay | Admitting: Family Medicine

## 2019-06-20 ENCOUNTER — Other Ambulatory Visit: Payer: Self-pay | Admitting: Family Medicine

## 2019-06-30 ENCOUNTER — Encounter: Payer: Self-pay | Admitting: *Deleted

## 2019-07-07 ENCOUNTER — Other Ambulatory Visit: Payer: Self-pay | Admitting: Family Medicine

## 2019-08-24 ENCOUNTER — Other Ambulatory Visit: Payer: Self-pay | Admitting: Family Medicine

## 2019-09-28 ENCOUNTER — Other Ambulatory Visit: Payer: Self-pay

## 2019-09-28 ENCOUNTER — Encounter: Payer: Self-pay | Admitting: Family Medicine

## 2019-09-28 ENCOUNTER — Ambulatory Visit (INDEPENDENT_AMBULATORY_CARE_PROVIDER_SITE_OTHER): Payer: Commercial Managed Care - PPO | Admitting: Family Medicine

## 2019-09-28 ENCOUNTER — Other Ambulatory Visit: Payer: Self-pay | Admitting: Family Medicine

## 2019-09-28 VITALS — BP 112/62 | HR 72 | Temp 98.2°F | Resp 14 | Ht 66.0 in | Wt 242.0 lb

## 2019-09-28 DIAGNOSIS — E785 Hyperlipidemia, unspecified: Secondary | ICD-10-CM

## 2019-09-28 DIAGNOSIS — Z0000 Encounter for general adult medical examination without abnormal findings: Secondary | ICD-10-CM

## 2019-09-28 DIAGNOSIS — E118 Type 2 diabetes mellitus with unspecified complications: Secondary | ICD-10-CM

## 2019-09-28 DIAGNOSIS — Z0001 Encounter for general adult medical examination with abnormal findings: Secondary | ICD-10-CM

## 2019-09-28 DIAGNOSIS — F419 Anxiety disorder, unspecified: Secondary | ICD-10-CM

## 2019-09-28 DIAGNOSIS — F411 Generalized anxiety disorder: Secondary | ICD-10-CM

## 2019-09-28 DIAGNOSIS — D229 Melanocytic nevi, unspecified: Secondary | ICD-10-CM

## 2019-09-28 DIAGNOSIS — E0841 Diabetes mellitus due to underlying condition with diabetic mononeuropathy: Secondary | ICD-10-CM | POA: Diagnosis not present

## 2019-09-28 DIAGNOSIS — Z1231 Encounter for screening mammogram for malignant neoplasm of breast: Secondary | ICD-10-CM

## 2019-09-28 MED ORDER — SERTRALINE HCL 100 MG PO TABS: 150.0000 mg | ORAL_TABLET | Freq: Every day | ORAL | 1 refills | Status: DC

## 2019-09-28 MED ORDER — ALPRAZOLAM 0.5 MG PO TABS: 0.5000 mg | ORAL_TABLET | Freq: Two times a day (BID) | ORAL | 2 refills | Status: DC | PRN

## 2019-09-28 NOTE — Progress Notes (Signed)
Subjective:    Patient ID: Kimberly Solis, female    DOB: January 17, 1961, 59 y.o.   MRN: [id number]  Patient presents for Annual Exam (is fasting)  Patient here for complete physical exam.  Medications and history reviewed  Immunizations-  COVID-19 Vaccines - Discussed   TDAP/FLU/PNA  UTD    , Shingles vaccine -declines   Colon cancer screen- Colonoscopy done March 2020 , Continues to f/u Dr. Collene Mares- has history of  Ulcertative colitis, but recent biopsy shows ? Crohn's disease , she is not sure which one she has Feels bloated all the time, has multiple BM throughout the day,  nausea in the mornings most days, no vomiting. Doesn't feel like her medication is working   PAP Smear UTD - GYN Dr. Runell Gess- [facility]   Mammogram- due   DM- currently on metformin 10107m BID, actos 4188mdaily , lisinopril  2.88m68maily for renal protection   CBG range - states has been good, did not bring meter with her  Last A1C in Jan 6.9%   GAD- she is highly stressed dealing with her husbands declining health , she is trying to prepare for him going on disability but worried about finances and being able to  Afford her own meds  she has had more panic/anxiety attacks   at night time the symptoms are worse  Now taking 1/2 tablet xanax every night at  bedtime just to calm her mind from thinking about things   Taking zoloft 100m29mce a day Doesn't feel sad or depressed  Hyperlipidemia tolerating lipitor without SE, last LDL was  116 which was improved from previous check   She was recently seen by Dr. MillSabra Heckld pressures were elevated , told she can use anti-histamines during surgery    Has a new dark mole on her left hand past few months, would like to have checked   Review Of Systems:  GEN- denies fatigue, fever, weight loss,weakness, recent illness HEENT- denies eye drainage, change in vision, nasal discharge, CVS- denies chest pain, palpitations RESP- denies SOB, cough, wheeze ABD-  denies N/V, change in stools, abd pain GU- denies dysuria, hematuria, dribbling, incontinence MSK- denies joint pain, muscle aches, injury Neuro- denies headache, dizziness, syncope, seizure activity       Objective:    BP 112/62   Pulse 72   Temp 98.2 F (36.8 C) (Temporal)   Resp 14   Ht 5' 6"  (1.676 m)   Wt 242 lb (109.8 kg)   LMP  (LMP Unknown)   SpO2 97%   BMI 39.06 kg/m  GEN- NAD, alert and oriented x3 HEENT- PERRL, EOMI, non injected sclera, pink conjunctiva, MMM, oropharynx clear Neck- Supple, no thyromegaly CVS- RRR, no murmur RESP-CTAB ABD-NABS,soft,NT,ND Psych- stressed, not depressed appearing, well groomed, no SI Skin- hyperpigmented blue/back circular mole left hand base of thumb, multiple nevi on back, few on neck, upper arms EXT- No edema Pulses- Radial, DP- 2+   FALL/CAGE screen neg PHQ-9 score 4      Assessment & Plan:      Problem List Items Addressed This Visit      Unprioritized   Diabetes mellitus type 2 with complications (HCC)[place] Has been well controlled However I query if I Need to decrease her metformin and try another med Such as GLP-1 therapy to help with diabetes and weight Due to her ongoing GI issues Will see what GI recommends at next visit first  Relevant Orders   CBC with Differential/Platelet   Comprehensive metabolic panel   Lipid panel   Hemoglobin A1c   HM DIABETES FOOT EXAM (Completed)   Microalbumin / creatinine urine ratio   Diabetic neuropathy (HCC)   Relevant Orders   Lipid panel   GAD (generalized anxiety disorder)    Significant stressors surrounding husbands health But anxiety not controlled Increase zoloft to 155m once a day, goal is to reduce benzo use,       Relevant Medications   sertraline (ZOLOFT) 100 MG tablet   ALPRAZolam (XANAX) 0.5 MG tablet   Hyperlipidemia    Continue statin drug       Relevant Orders   Lipid panel    Other Visit Diagnoses    Routine general medical  examination at a health care facility    -  Primary   CPE done, fasting labs, pt to schedule mammogram, obtain note from eye doctor and GI to clarify diagnosis UC vs Crohns, she will talkto them about her GI issues   Atypical nevus       dark nevus, but also has multiple nevi on back, fair skinned, refer to dermatology for skin/mole check   Relevant Orders   Ambulatory referral to Dermatology   Anxiety       Relevant Medications   sertraline (ZOLOFT) 100 MG tablet   ALPRAZolam (XANAX) 0.5 MG tablet   Encounter for screening mammogram for malignant neoplasm of breast       Pt to schedule mammogram       Note: This dictation was prepared with Dragon dictation along with smaller phrase technology. Any transcriptional errors that result from this process are unintentional.

## 2019-09-28 NOTE — Assessment & Plan Note (Signed)
Significant stressors surrounding husbands health But anxiety not controlled Increase zoloft to 162m once a day, goal is to reduce benzo use,

## 2019-09-28 NOTE — Assessment & Plan Note (Signed)
Continue statin drug

## 2019-09-28 NOTE — Assessment & Plan Note (Signed)
Has been well controlled However I query if I Need to decrease her metformin and try another med Such as GLP-1 therapy to help with diabetes and weight Due to her ongoing GI issues Will see what GI recommends at next visit first

## 2019-09-28 NOTE — Patient Instructions (Addendum)
Increase zoloft to  186m once a day  We will call with lab results  Referral to dermatology F/u 4 months     If you need to obtain forms or bring papers to your local Social Security office, the [facility] [place] [address] [place] [address]

## 2019-09-29 LAB — COMPREHENSIVE METABOLIC PANEL
AG Ratio: 2 (calc) (ref 1.0–2.5)
ALT: 13 U/L (ref 6–29)
AST: 14 U/L (ref 10–35)
Albumin: 4.6 g/dL (ref 3.6–5.1)
Alkaline phosphatase (APISO): 70 U/L (ref 37–153)
BUN: 12 mg/dL (ref 7–25)
CO2: 29 mmol/L (ref 20–32)
Calcium: 9.4 mg/dL (ref 8.6–10.4)
Chloride: 103 mmol/L (ref 98–110)
Creat: 0.79 mg/dL (ref 0.50–1.05)
Globulin: 2.3 g/dL (calc) (ref 1.9–3.7)
Glucose, Bld: 136 mg/dL — ABNORMAL HIGH (ref 65–99)
Potassium: 5.1 mmol/L (ref 3.5–5.3)
Sodium: 142 mmol/L (ref 135–146)
Total Bilirubin: 0.3 mg/dL (ref 0.2–1.2)
Total Protein: 6.9 g/dL (ref 6.1–8.1)

## 2019-09-29 LAB — LIPID PANEL
Cholesterol: 173 mg/dL (ref <200)
HDL: 54 mg/dL (ref >=50)
LDL Cholesterol (Calc): 93 mg/dL (calc)
Non-HDL Cholesterol (Calc): 119 mg/dL (calc) (ref <130)
Total CHOL/HDL Ratio: 3.2 (calc) (ref <5.0)
Triglycerides: 165 mg/dL — ABNORMAL HIGH (ref <150)

## 2019-09-29 LAB — CBC WITH DIFFERENTIAL/PLATELET
Absolute Monocytes: 662 cells/uL (ref 200–950)
Basophils Absolute: 62 cells/uL (ref 0–200)
Basophils Relative: 0.9 %
Eosinophils Absolute: 221 cells/uL (ref 15–500)
Eosinophils Relative: 3.2 %
HCT: 39.3 % (ref 35.0–45.0)
Hemoglobin: 12.8 g/dL (ref 11.7–15.5)
Lymphs Abs: 2463 cells/uL (ref 850–3900)
MCH: 29.6 pg (ref 27.0–33.0)
MCHC: 32.6 g/dL (ref 32.0–36.0)
MCV: 91 fL (ref 80.0–100.0)
MPV: 10.7 fL (ref 7.5–12.5)
Monocytes Relative: 9.6 %
Neutro Abs: 3491 cells/uL (ref 1500–7800)
Neutrophils Relative %: 50.6 %
Platelets: 327 10*3/uL (ref 140–400)
RBC: 4.32 10*6/uL (ref 3.80–5.10)
RDW: 12.4 % (ref 11.0–15.0)
Total Lymphocyte: 35.7 %
WBC: 6.9 10*3/uL (ref 3.8–10.8)

## 2019-09-29 LAB — HEMOGLOBIN A1C
Hgb A1c MFr Bld: 6.6 % of total Hgb — ABNORMAL HIGH (ref <5.7)
Mean Plasma Glucose: 143 (calc)
eAG (mmol/L): 7.9 (calc)

## 2019-09-29 LAB — MICROALBUMIN / CREATININE URINE RATIO
Creatinine, Urine: 177 mg/dL (ref 20–275)
Microalb Creat Ratio: 3 mcg/mg creat (ref <30)
Microalb, Ur: 0.5 mg/dL

## 2019-10-07 ENCOUNTER — Other Ambulatory Visit: Payer: Self-pay | Admitting: Family Medicine

## 2019-10-10 ENCOUNTER — Other Ambulatory Visit: Payer: Self-pay | Admitting: Family Medicine

## 2019-10-15 ENCOUNTER — Other Ambulatory Visit: Payer: Self-pay | Admitting: Family Medicine

## 2019-11-24 ENCOUNTER — Ambulatory Visit: Payer: Commercial Managed Care - PPO | Admitting: Nurse Practitioner

## 2019-11-24 ENCOUNTER — Other Ambulatory Visit: Payer: Self-pay

## 2019-11-24 VITALS — BP 134/82 | HR 82 | Temp 98.2°F | Resp 18 | Wt 241.8 lb

## 2019-11-24 DIAGNOSIS — R399 Unspecified symptoms and signs involving the genitourinary system: Secondary | ICD-10-CM

## 2019-11-24 DIAGNOSIS — N390 Urinary tract infection, site not specified: Secondary | ICD-10-CM

## 2019-11-24 DIAGNOSIS — R319 Hematuria, unspecified: Secondary | ICD-10-CM

## 2019-11-24 LAB — URINALYSIS, ROUTINE W REFLEX MICROSCOPIC
Bilirubin Urine: NEGATIVE
Glucose, UA: NEGATIVE
Hyaline Cast: NONE SEEN /LPF
Ketones, ur: NEGATIVE
Nitrite: NEGATIVE
Specific Gravity, Urine: 1.02 (ref 1.001–1.03)
pH: 5.5 (ref 5.0–8.0)

## 2019-11-24 LAB — MICROSCOPIC MESSAGE

## 2019-11-24 MED ORDER — CIPROFLOXACIN HCL 500 MG PO TABS: 500.0000 mg | ORAL_TABLET | Freq: Two times a day (BID) | ORAL | 0 refills | Status: AC

## 2019-11-24 NOTE — Progress Notes (Signed)
Established Patient Office Visit  Subjective:  Patient ID: Kimberly Solis, female    DOB: Jul 19, 1960  Age: 59 y.o. MRN: [id number]  CC:  Chief Complaint  Patient presents with  . Urinary Tract Infection    abd pressure, hematuria once, urinates a little, started x5 days, tylenol was taken    HPI Kimberly Solis is a 59 year old female presenting with sxs of uti. Her sxs are seeing blood in her urine, pelvic pressure, urinary urgency and frequency. Her sxs started 2 days ago. She has not tried treatments. She thinks that she may have a UTI. No fever, chills, CVA pain or tenderness. No gu/gi sxs other than stated.   Past Medical History:  Diagnosis Date  . Anxiety   . Diabetes mellitus without complication ([place])   . Hyperlipidemia   . Hypertension   . Ulcerative colitis ([place]) 05/05/2005    No past surgical history on file.  Family History  Problem Relation Age of Onset  . Breast cancer Sister     Social History   Socioeconomic History  . Marital status: Married    Spouse name: Not on file  . Number of children: Not on file  . Years of education: Not on file  . Highest education level: Not on file  Occupational History  . Not on file  Tobacco Use  . Smoking status: Never Smoker  . Smokeless tobacco: Never Used  Substance and Sexual Activity  . Alcohol use: No  . Drug use: No  . Sexual activity: Not on file  Other Topics Concern  . 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   She is married.   Her husband has lupus.   Her daughter was recently diagnosed with lupus.   Her mother has Parkinson's disease. Patient is involved with helping care for her.   Social Determinants of Health   Financial Resource Strain:   . Difficulty of Paying Living Expenses:   Food Insecurity:   . Worried About Charity fundraiser in the Last Year:   . Arboriculturist in the Last Year:   Transportation Needs:     . Film/video editor (Medical):   Marland Kitchen Lack of Transportation (Non-Medical):   Physical Activity:   . Days of Exercise per Week:   . Minutes of Exercise per Session:   Stress:   . Feeling of Stress :   Social Connections:   . Frequency of Communication with Friends and Family:   . Frequency of Social Gatherings with Friends and Family:   . Attends Religious Services:   . Active Member of Clubs or Organizations:   . Attends Archivist Meetings:   Marland Kitchen Marital Status:   Intimate Partner Violence:   . Fear of Current or Ex-Partner:   . Emotionally Abused:   Marland Kitchen Physically Abused:   . Sexually Abused:     Outpatient Medications Prior to Visit  Medication Sig Dispense Refill  . Adalimumab (HUMIRA) 40 MG/0.8ML PSKT Inject into the skin. QOW    . ALPRAZolam (XANAX) 0.5 MG tablet Take 1 tablet (0.5 mg total) by mouth 2 (two) times daily as needed for sleep. 60 tablet 2  . atorvastatin (LIPITOR) 40 MG tablet Take 1 tablet (40 mg total) by mouth daily. 90 tablet 3  . Blood Glucose Monitoring Suppl (BLOOD GLUCOSE SYSTEM PAK) KIT Please dispense based on patient and insurance preference. Use as  directed to monitor FSBS 2x daily. Dx: E11.9 1 kit 1  . Coenzyme Q10 (COQ10 PO) Take 1 tablet by mouth daily.    Marland Kitchen estradiol (MINIVELLE) 0.05 MG/24HR patch Place 1 patch onto the skin 2 (two) times a week.    Marland Kitchen lisinopril (ZESTRIL) 2.5 MG tablet TAKE 1 TABLET (2.5 MG TOTAL) BY MOUTH DAILY. TO PROTECT YOUR KIDNEYS 30 tablet 1  . metFORMIN (GLUCOPHAGE) 1000 MG tablet TAKE 1 TABLET BY MOUTH TWICE A DAY 180 tablet 1  . Multiple Vitamin (MULTIVITAMIN) tablet Take 1 tablet by mouth daily. [facility] +50    . OneTouch Delica Lancets 18E MISC USE AS DIRECTED TO MONITOR FSBS 2X DAILY 100 each 1  . ONETOUCH ULTRA test strip USE AS DIRECTED TO MONITOR FSBS 2X DAILY. DX: E11.9 100 strip 1  . progesterone (PROMETRIUM) 100 MG capsule Take 100 mg by mouth at bedtime.  12  . sertraline (ZOLOFT) 100 MG tablet  Take 1.5 tablets (150 mg total) by mouth daily. 135 tablet 1  . pioglitazone (ACTOS) 45 MG tablet TAKE 1 TABLET BY MOUTH EVERY DAY 90 tablet 0   No facility-administered medications prior to visit.    Allergies  Allergen Reactions  . Doxycycline Shortness Of Breath  . Mesalamine Er     SOB, Dizziness    ROS Review of Systems  All other systems reviewed and are negative.     Objective:    Physical Exam Constitutional:      General: She is not in acute distress.    Appearance: Normal appearance. She is not ill-appearing, toxic-appearing or diaphoretic.  HENT:     Head: Normocephalic.     Right Ear: External ear normal.     Left Ear: External ear normal.  Eyes:     Extraocular Movements: Extraocular movements intact.     Conjunctiva/sclera: Conjunctivae normal.     Pupils: Pupils are equal, round, and reactive to light.  Cardiovascular:     Rate and Rhythm: Normal rate.  Pulmonary:     Effort: Pulmonary effort is normal.  Abdominal:     Tenderness: There is no right CVA tenderness or left CVA tenderness.  Musculoskeletal:     Right lower leg: No edema.     Left lower leg: No edema.  Lymphadenopathy:     Cervical: No cervical adenopathy.  Skin:    General: Skin is warm and dry.     Coloration: Skin is not jaundiced or pale.     Findings: No rash.  Neurological:     General: No focal deficit present.     Mental Status: She is alert and oriented to person, place, and time.  Psychiatric:        Mood and Affect: Mood normal.        Behavior: Behavior normal.        Thought Content: Thought content normal.        Judgment: Judgment normal.     BP (!) 134/82 (BP Location: Left Arm, Patient Position: Sitting, Cuff Size: Large)   Pulse 82   Temp 98.2 F (36.8 C) (Temporal)   Resp 18   Wt (!) 241 lb 12.8 oz (109.7 kg)   LMP  (LMP Unknown)   SpO2 97%   BMI 39.03 kg/m  Wt Readings from Last 3 Encounters:  11/24/19 (!) 241 lb 12.8 oz (109.7 kg)  09/28/19 242 lb  (109.8 kg)  05/30/19 241 lb 6 oz (109.5 kg)     Health Maintenance Due  Topic Date Due  . COVID-19 Vaccine (1) Never done    There are no preventive care reminders to display for this patient.  Lab Results  Component Value Date   TSH 1.42 09/24/2018   Lab Results  Component Value Date   WBC 6.9 09/28/2019   HGB 12.8 09/28/2019   HCT 39.3 09/28/2019   MCV 91.0 09/28/2019   PLT 327 09/28/2019   Lab Results  Component Value Date   NA 142 09/28/2019   K 5.1 09/28/2019   CO2 29 09/28/2019   GLUCOSE 136 (H) 09/28/2019   BUN 12 09/28/2019   CREATININE 0.79 09/28/2019   BILITOT 0.3 09/28/2019   ALKPHOS 69 12/11/2016   AST 14 09/28/2019   ALT 13 09/28/2019   PROT 6.9 09/28/2019   ALBUMIN 4.3 12/11/2016   CALCIUM 9.4 09/28/2019   Lab Results  Component Value Date   CHOL 173 09/28/2019   Lab Results  Component Value Date   HDL 54 09/28/2019   Lab Results  Component Value Date   LDLCALC 93 09/28/2019   Lab Results  Component Value Date   TRIG 165 (H) 09/28/2019   Lab Results  Component Value Date   CHOLHDL 3.2 09/28/2019   Lab Results  Component Value Date   HGBA1C 6.6 (H) 09/28/2019      Assessment & Plan:   Problem List Items Addressed This Visit    None    Visit Diagnoses    UTI symptoms    -  Primary   Relevant Orders   Urinalysis, Routine w reflex microscopic (Completed)   Urinary tract infection with hematuria, site unspecified       Relevant Medications   ciprofloxacin (CIPRO) 500 MG tablet   Other Relevant Orders   Urine Culture (Completed)     You have a Urinary Tract Infection: A urinary tract infection (UTI) is an infection of any part of the urinary tract. This condition is caused by germs in your genital area. There are many risk factors for a UTI. These include having a small, thin tube to drain pee and not being able to control when you pee or poop. Treatment includes antibiotic medicines for germs. Drink enough fluid to keep  your pee pale yellow. Pee until your bladder is empty. Do not hold pee for a long time. Empty your bladder after sex. Wipe from front to back after pooping if you are a female. Use each tissue one time when you wipe. Drink enough fluid to keep your pee pale yellow. Meds ordered this encounter  Medications  . ciprofloxacin (CIPRO) 500 MG tablet    Sig: Take 1 tablet (500 mg total) by mouth 2 (two) times daily for 7 days.    Dispense:  20 tablet    Refill:  0    Follow-up: Return if symptoms worsen or fail to improve.    Annie Main, FNP

## 2019-11-25 LAB — URINE CULTURE
MICRO NUMBER:: 10737593
SPECIMEN QUALITY:: ADEQUATE

## 2019-11-28 ENCOUNTER — Other Ambulatory Visit: Payer: Self-pay | Admitting: Family Medicine

## 2019-11-28 NOTE — Patient Instructions (Addendum)
You have a Urinary Tract Infection: A urinary tract infection (UTI) is an infection of any part of the urinary tract. This condition is caused by germs in your genital area. There are many risk factors for a UTI. These include having a small, thin tube to drain pee and not being able to control when you pee or poop. Treatment includes antibiotic medicines for germs. Drink enough fluid to keep your pee pale yellow. Pee until your bladder is empty. Do not hold pee for a long time. Empty your bladder after sex. Wipe from front to back after pooping if you are a female. Use each tissue one time when you wipe. Drink enough fluid to keep your pee pale yellow.

## 2020-01-07 ENCOUNTER — Other Ambulatory Visit: Payer: Self-pay | Admitting: Family Medicine

## 2020-01-31 ENCOUNTER — Ambulatory Visit (INDEPENDENT_AMBULATORY_CARE_PROVIDER_SITE_OTHER): Payer: Commercial Managed Care - PPO | Admitting: Family Medicine

## 2020-01-31 ENCOUNTER — Encounter: Payer: Self-pay | Admitting: Family Medicine

## 2020-01-31 ENCOUNTER — Other Ambulatory Visit: Payer: Self-pay

## 2020-01-31 VITALS — BP 122/80 | HR 87 | Temp 98.5°F | Ht 66.0 in | Wt 241.0 lb

## 2020-01-31 DIAGNOSIS — E0841 Diabetes mellitus due to underlying condition with diabetic mononeuropathy: Secondary | ICD-10-CM

## 2020-01-31 DIAGNOSIS — F411 Generalized anxiety disorder: Secondary | ICD-10-CM | POA: Diagnosis not present

## 2020-01-31 DIAGNOSIS — E785 Hyperlipidemia, unspecified: Secondary | ICD-10-CM

## 2020-01-31 DIAGNOSIS — E118 Type 2 diabetes mellitus with unspecified complications: Secondary | ICD-10-CM | POA: Diagnosis not present

## 2020-01-31 MED ORDER — OZEMPIC (0.25 OR 0.5 MG/DOSE) 2 MG/1.5ML ~~LOC~~ SOPN: 0.2500 mg | PEN_INJECTOR | SUBCUTANEOUS | 1 refills | Status: DC

## 2020-01-31 MED ORDER — SERTRALINE HCL 100 MG PO TABS: 100.0000 mg | ORAL_TABLET | Freq: Every day | ORAL | 1 refills | Status: DC

## 2020-01-31 NOTE — Progress Notes (Signed)
Subjective:    Patient ID: Kimberly Solis, female    DOB: 05-25-1960, 59 y.o.   MRN: [id number]  Patient presents for Follow-up (4 months)  Patient here to follow-up chronic medical problems.  Medications reviewed.  Diabetes Mellitus last A1c 6.6% in May , I had considered starting her on GLP-1 but she was already having significant GI issues and had OV with GI scheduled, she was told it wasn't a flare but more stress. She is interested in GLP-1 therapy   Currently taking metformin 1021m BID, actos  432monce a day   CBG range  130-150's, no hypoglycemia  Hyperlipidemia she is taking lipitor 4050mlast LDL  93 in May, TG elevated at 165   No SE with meds   Generalized anxiety at her visit back in May her Zoloft was increased to 150 mg trying to help with anxiety surrounding her husband's health.  Also reduce benzo diazepam use.    She states the higher dose made her feel jittery, so she reduced back to 100m16mnd only taking 1/2 tab of xanax at bedtime for sleep unfortunately a niece she practically raised died for substance abuse 3 weeks ago and that has been very difficult  Discussed COVID-19 vaccine- pt declines at this time Return for flu shot     Review Of Systems:  GEN- denies fatigue, fever, weight loss,weakness, recent illness HEENT- denies eye drainage, change in vision, nasal discharge, CVS- denies chest pain, palpitations RESP- denies SOB, cough, wheeze ABD- denies N/V, change in stools, abd pain GU- denies dysuria, hematuria, dribbling, incontinence MSK- denies joint pain, muscle aches, injury Neuro- denies headache, dizziness, syncope, seizure activity       Objective:    BP 122/80   Pulse 87   Temp 98.5 F (36.9 C) (Oral)   Ht 5' 6"  (1.676 m)   Wt 241 lb (109.3 kg)   LMP  (LMP Unknown)   SpO2 97%   BMI 38.90 kg/m  GEN- NAD, alert and oriented x3 HEENT- PERRL, EOMI, non injected sclera, pink conjunctiva, MMM, oropharynx clear Neck- Supple, no  thyromegaly CVS- RRR, no murmur RESP-CTAB ABD-NABS,soft,NT,ND Psych normal affect and mood  EXT- No edema Pulses- Radial, DP- 2+        Assessment & Plan:      Problem List Items Addressed This Visit      Unprioritized   Diabetes mellitus type 2 with complications (HCC) - [place]    Continue metformin, goal A1C < 7%  Plan to start ozempic 0.25mg54mkly, discussed SE of the med Once ozemic started D/C Actos  Continue low dose ACEI Check lipids as TG previously elevated, continue with low carb diet       Relevant Medications   Semaglutide,0.25 or 0.5MG/DOS, (OZEMPIC, 0.25 OR 0.5 MG/DOSE,) 2 MG/1.5ML SOPN   Other Relevant Orders   CBC with Differential/Platelet   Comprehensive metabolic panel   Hemoglobin A1c   Diabetic neuropathy (HCC)   Relevant Medications   Semaglutide,0.25 or 0.5MG/DOS, (OZEMPIC, 0.25 OR 0.5 MG/DOSE,) 2 MG/1.5ML SOPN   GAD (generalized anxiety disorder)    She wants to continue current regimen zoloft 100mg,93max prn at bedtime I think it is reasonable as she feels overall symptoms are controlled She will alert me if any other changes need to occur       Relevant Medications   sertraline (ZOLOFT) 100 MG tablet   Hyperlipidemia   Relevant Orders   Lipid panel      Note: This  dictation was prepared with Dragon dictation along with smaller phrase technology. Any transcriptional errors that result from this process are unintentional.

## 2020-01-31 NOTE — Assessment & Plan Note (Signed)
She wants to continue current regimen zoloft 12m, xanax prn at bedtime I think it is reasonable as she feels overall symptoms are controlled She will alert me if any other changes need to occur

## 2020-01-31 NOTE — Patient Instructions (Addendum)
Start the ozempic once a week  Stop the actos  F/u 4 months

## 2020-01-31 NOTE — Assessment & Plan Note (Signed)
Continue metformin, goal A1C < 7%  Plan to start ozempic 0.53m weekly, discussed SE of the med Once ozemic started D/C Actos  Continue low dose ACEI Check lipids as TG previously elevated, continue with low carb diet

## 2020-02-01 LAB — CBC WITH DIFFERENTIAL/PLATELET
Absolute Monocytes: 820 cells/uL (ref 200–950)
Basophils Absolute: 41 cells/uL (ref 0–200)
Basophils Relative: 0.5 %
Eosinophils Absolute: 131 cells/uL (ref 15–500)
Eosinophils Relative: 1.6 %
HCT: 40.8 % (ref 35.0–45.0)
Hemoglobin: 13.2 g/dL (ref 11.7–15.5)
Lymphs Abs: 2501 cells/uL (ref 850–3900)
MCH: 29.9 pg (ref 27.0–33.0)
MCHC: 32.4 g/dL (ref 32.0–36.0)
MCV: 92.3 fL (ref 80.0–100.0)
MPV: 10.7 fL (ref 7.5–12.5)
Monocytes Relative: 10 %
Neutro Abs: 4707 cells/uL (ref 1500–7800)
Neutrophils Relative %: 57.4 %
Platelets: 353 10*3/uL (ref 140–400)
RBC: 4.42 10*6/uL (ref 3.80–5.10)
RDW: 12.6 % (ref 11.0–15.0)
Total Lymphocyte: 30.5 %
WBC: 8.2 10*3/uL (ref 3.8–10.8)

## 2020-02-01 LAB — COMPREHENSIVE METABOLIC PANEL
AG Ratio: 1.6 (calc) (ref 1.0–2.5)
ALT: 11 U/L (ref 6–29)
AST: 15 U/L (ref 10–35)
Albumin: 4.4 g/dL (ref 3.6–5.1)
Alkaline phosphatase (APISO): 69 U/L (ref 37–153)
BUN: 12 mg/dL (ref 7–25)
CO2: 28 mmol/L (ref 20–32)
Calcium: 9.6 mg/dL (ref 8.6–10.4)
Chloride: 102 mmol/L (ref 98–110)
Creat: 0.82 mg/dL (ref 0.50–1.05)
Globulin: 2.7 g/dL (calc) (ref 1.9–3.7)
Glucose, Bld: 148 mg/dL — ABNORMAL HIGH (ref 65–99)
Potassium: 4.5 mmol/L (ref 3.5–5.3)
Sodium: 140 mmol/L (ref 135–146)
Total Bilirubin: 0.4 mg/dL (ref 0.2–1.2)
Total Protein: 7.1 g/dL (ref 6.1–8.1)

## 2020-02-01 LAB — LIPID PANEL
Cholesterol: 181 mg/dL (ref <200)
HDL: 56 mg/dL (ref >=50)
LDL Cholesterol (Calc): 101 mg/dL (calc) — ABNORMAL HIGH
Non-HDL Cholesterol (Calc): 125 mg/dL (calc) (ref <130)
Total CHOL/HDL Ratio: 3.2 (calc) (ref <5.0)
Triglycerides: 142 mg/dL (ref <150)

## 2020-02-01 LAB — HEMOGLOBIN A1C
Hgb A1c MFr Bld: 7 % of total Hgb — ABNORMAL HIGH (ref <5.7)
Mean Plasma Glucose: 154 (calc)
eAG (mmol/L): 8.5 (calc)

## 2020-02-10 ENCOUNTER — Telehealth: Payer: Self-pay | Admitting: *Deleted

## 2020-02-10 MED ORDER — OZEMPIC (0.25 OR 0.5 MG/DOSE) 2 MG/1.5ML ~~LOC~~ SOPN: 0.2500 mg | PEN_INJECTOR | SUBCUTANEOUS | 1 refills | Status: DC

## 2020-02-10 NOTE — Telephone Encounter (Signed)
Received PA determination.   PA [id number] approved 02/10/2020- 08/08/2020.   pharmacy made aware.

## 2020-02-10 NOTE — Telephone Encounter (Signed)
Received request from pharmacy for [place] [facility] [address].   PA submitted.   Dx: E11.9- DM  Your information has been sent to [facility].

## 2020-02-14 ENCOUNTER — Other Ambulatory Visit: Payer: Self-pay | Admitting: Family Medicine

## 2020-03-22 LAB — HM DIABETES EYE EXAM

## 2020-04-01 ENCOUNTER — Other Ambulatory Visit: Payer: Self-pay | Admitting: Family Medicine

## 2020-04-23 ENCOUNTER — Other Ambulatory Visit: Payer: Self-pay | Admitting: Family Medicine

## 2020-05-01 ENCOUNTER — Other Ambulatory Visit: Payer: Self-pay | Admitting: Family Medicine

## 2020-06-01 ENCOUNTER — Other Ambulatory Visit: Payer: Self-pay

## 2020-06-01 ENCOUNTER — Encounter: Payer: Self-pay | Admitting: Family Medicine

## 2020-06-01 ENCOUNTER — Ambulatory Visit: Payer: Commercial Managed Care - PPO | Admitting: Family Medicine

## 2020-06-01 VITALS — BP 128/64 | HR 89 | Temp 97.8°F | Ht 66.0 in | Wt 225.0 lb

## 2020-06-01 DIAGNOSIS — E118 Type 2 diabetes mellitus with unspecified complications: Secondary | ICD-10-CM | POA: Diagnosis not present

## 2020-06-01 DIAGNOSIS — N950 Postmenopausal bleeding: Secondary | ICD-10-CM

## 2020-06-01 DIAGNOSIS — E0841 Diabetes mellitus due to underlying condition with diabetic mononeuropathy: Secondary | ICD-10-CM

## 2020-06-01 DIAGNOSIS — E669 Obesity, unspecified: Secondary | ICD-10-CM

## 2020-06-01 DIAGNOSIS — E785 Hyperlipidemia, unspecified: Secondary | ICD-10-CM

## 2020-06-01 DIAGNOSIS — F419 Anxiety disorder, unspecified: Secondary | ICD-10-CM

## 2020-06-01 DIAGNOSIS — F411 Generalized anxiety disorder: Secondary | ICD-10-CM | POA: Diagnosis not present

## 2020-06-01 DIAGNOSIS — H409 Unspecified glaucoma: Secondary | ICD-10-CM | POA: Insufficient documentation

## 2020-06-01 DIAGNOSIS — Z23 Encounter for immunization: Secondary | ICD-10-CM | POA: Diagnosis not present

## 2020-06-01 MED ORDER — OZEMPIC (0.25 OR 0.5 MG/DOSE) 2 MG/1.5ML ~~LOC~~ SOPN: 0.2500 mg | PEN_INJECTOR | SUBCUTANEOUS | 1 refills | Status: DC

## 2020-06-01 MED ORDER — ALPRAZOLAM 0.5 MG PO TABS
0.5000 mg | ORAL_TABLET | Freq: Two times a day (BID) | ORAL | 2 refills | Status: DC | PRN
Start: 2020-06-01 — End: 2020-09-17

## 2020-06-01 NOTE — Patient Instructions (Signed)
F/U 4 months Kimberly Solis

## 2020-06-01 NOTE — Assessment & Plan Note (Signed)
-  Continue Lipitor °

## 2020-06-01 NOTE — Assessment & Plan Note (Signed)
Improvement in her weight with addition of GLP-1 therapy.  She is also reducing her carbs in her diet.  States that she feels much better with the weight reduction.

## 2020-06-01 NOTE — Assessment & Plan Note (Signed)
Doing well with weight loss and blood sugar control with the addition of Ozempic.  We will check A1c today goal is less than 7%.  She is on statin drug and ACE inhibitor.  She does have underlying diabetic neuropathy.  I will obtain her last visit from her eye doctor.

## 2020-06-01 NOTE — Progress Notes (Signed)
Subjective:    Patient ID: Kimberly Solis, female    DOB: October 30, 1960, 60 y.o.   MRN: [id number]  Patient presents for Diabetes  Pt here to f/u chronic medical problems.   DM- last A1C 7% in Sept   CBG have beengood   taking ozempic, metformin  Stated on ozempic in Sept, currently on 0.39m once a week , she was supposed to d/c actos at that time  Weight down 16lbs since Sept  She has had some nausea, but during same time had URi and the bleeding. Appetite has improved now    HLD- last LDL in Sept 101 , she is on lipitor   UC - maintained on Humira per GI  GAD- maintained on zoloft and xanax , she was very stressed these past few weeks   She has had post menopausal bleeding, seen by GYN, had biopsy , is on hormone therapy to stop the bleeding  [facility] is [facility]   She has seen mEmergency planning/management officervision      Review Of Systems:  GEN- denies fatigue, fever, weight loss,weakness, recent illness HEENT- denies eye drainage, change in vision, nasal discharge, CVS- denies chest pain, palpitations RESP- denies SOB, cough, wheeze ABD- denies N/V, change in stools, abd pain GU- denies dysuria, hematuria, dribbling, incontinence MSK- denies joint pain, muscle aches, injury Neuro- denies headache, dizziness, syncope, seizure activity       Objective:    BP 128/64   Pulse 89   Temp 97.8 F (36.6 C)   Ht 5' 6"  (1.676 m)   Wt 225 lb (102.1 kg)   LMP  (LMP Unknown)   SpO2 99%   BMI 36.32 kg/m  GEN- NAD, alert and oriented x3 HEENT- PERRL, EOMI, non injected sclera, pink conjunctiva, MMM, oropharynx clear Neck- Supple, no thyromegaly CVS- RRR, no murmur RESP-CTAB ABD-NABS,soft,NT,ND Psych normal affect and mood  EXT- No edema Pulses- Radial, DP- 2+        Assessment & Plan:      Problem List Items Addressed This Visit      Unprioritized   [place]    Doing well with weight loss  and blood sugar control with the addition of Ozempic.  We will check A1c today goal is less than 7%.  She is on statin drug and ACE inhibitor.  She does have underlying diabetic neuropathy.  I will obtain her last visit from her eye doctor.      Relevant Medications   Semaglutide,0.25 or 0.5MG/DOS, (OZEMPIC, 0.25 OR 0.5 MG/DOSE,) 2 MG/1.5ML SOPN   Other Relevant Orders   CBC with Differential/Platelet   Comprehensive metabolic panel   Hemoglobin A1c   Diabetic neuropathy (HCC) - Primary   Relevant Medications   Semaglutide,0.25 or 0.5MG/DOS, (OZEMPIC, 0.25 OR 0.5 MG/DOSE,) 2 MG/1.5ML SOPN   GAD (generalized anxiety disorder)   Relevant Medications   ALPRAZolam (XANAX) 0.5 MG tablet   Glaucoma (increased eye pressure)   Hyperlipidemia    Continue Lipitor.      Relevant Orders   Lipid panel   Obesity (BMI 30-39.9)    Improvement in her weight with addition of GLP-1 therapy.  She is also reducing her carbs in her diet.  States that she feels much better with the weight reduction.      Relevant Medications   Semaglutide,0.25 or 0.5MG/DOS, (OZEMPIC, 0.25 OR 0.5 MG/DOSE,) 2 MG/1.5ML SOPN    Other Visit Diagnoses  Anxiety       Relevant Medications   ALPRAZolam (XANAX) 0.5 MG tablet   Post-menopausal bleeding       Followed by GYN.  I will check her iron and CBC todayand forward to  their office.   Relevant Orders   Iron, TIBC and Ferritin Panel   Need for immunization against influenza       Relevant Orders   Flu Vaccine QUAD 36+ mos IM (Completed)      Note: This dictation was prepared with Dragon dictation along with smaller phrase technology. Any transcriptional errors that result from this process are unintentional.

## 2020-06-02 LAB — COMPREHENSIVE METABOLIC PANEL
AG Ratio: 1.7 (calc) (ref 1.0–2.5)
ALT: 21 U/L (ref 6–29)
AST: 17 U/L (ref 10–35)
Albumin: 3.9 g/dL (ref 3.6–5.1)
Alkaline phosphatase (APISO): 64 U/L (ref 37–153)
BUN: 8 mg/dL (ref 7–25)
CO2: 26 mmol/L (ref 20–32)
Calcium: 9.1 mg/dL (ref 8.6–10.4)
Chloride: 107 mmol/L (ref 98–110)
Creat: 0.65 mg/dL (ref 0.50–1.05)
Globulin: 2.3 g/dL (calc) (ref 1.9–3.7)
Glucose, Bld: 137 mg/dL — ABNORMAL HIGH (ref 65–99)
Potassium: 4 mmol/L (ref 3.5–5.3)
Sodium: 142 mmol/L (ref 135–146)
Total Bilirubin: 0.3 mg/dL (ref 0.2–1.2)
Total Protein: 6.2 g/dL (ref 6.1–8.1)

## 2020-06-02 LAB — IRON,TIBC AND FERRITIN PANEL
%SAT: 14 % (calc) — ABNORMAL LOW (ref 16–45)
Ferritin: 25 ng/mL (ref 16–232)
Iron: 42 ug/dL — ABNORMAL LOW (ref 45–160)
TIBC: 305 mcg/dL (calc) (ref 250–450)

## 2020-06-02 LAB — CBC WITH DIFFERENTIAL/PLATELET
Absolute Monocytes: 663 cells/uL (ref 200–950)
Basophils Absolute: 39 cells/uL (ref 0–200)
Basophils Relative: 0.5 %
Eosinophils Absolute: 109 cells/uL (ref 15–500)
Eosinophils Relative: 1.4 %
HCT: 34.3 % — ABNORMAL LOW (ref 35.0–45.0)
Hemoglobin: 11.1 g/dL — ABNORMAL LOW (ref 11.7–15.5)
Lymphs Abs: 2301 cells/uL (ref 850–3900)
MCH: 29.1 pg (ref 27.0–33.0)
MCHC: 32.4 g/dL (ref 32.0–36.0)
MCV: 90 fL (ref 80.0–100.0)
MPV: 10.6 fL (ref 7.5–12.5)
Monocytes Relative: 8.5 %
Neutro Abs: 4688 cells/uL (ref 1500–7800)
Neutrophils Relative %: 60.1 %
Platelets: 361 10*3/uL (ref 140–400)
RBC: 3.81 10*6/uL (ref 3.80–5.10)
RDW: 12.5 % (ref 11.0–15.0)
Total Lymphocyte: 29.5 %
WBC: 7.8 10*3/uL (ref 3.8–10.8)

## 2020-06-02 LAB — LIPID PANEL
Cholesterol: 243 mg/dL — ABNORMAL HIGH (ref <200)
HDL: 44 mg/dL — ABNORMAL LOW (ref >=50)
LDL Cholesterol (Calc): 162 mg/dL (calc) — ABNORMAL HIGH
Non-HDL Cholesterol (Calc): 199 mg/dL (calc) — ABNORMAL HIGH (ref <130)
Total CHOL/HDL Ratio: 5.5 (calc) — ABNORMAL HIGH (ref <5.0)
Triglycerides: 201 mg/dL — ABNORMAL HIGH (ref <150)

## 2020-06-02 LAB — HEMOGLOBIN A1C
Hgb A1c MFr Bld: 6.9 % of total Hgb — ABNORMAL HIGH (ref <5.7)
Mean Plasma Glucose: 151 mg/dL
eAG (mmol/L): 8.4 mmol/L

## 2020-07-02 ENCOUNTER — Other Ambulatory Visit: Payer: Self-pay | Admitting: Obstetrics and Gynecology

## 2020-07-02 DIAGNOSIS — R928 Other abnormal and inconclusive findings on diagnostic imaging of breast: Secondary | ICD-10-CM

## 2020-07-18 ENCOUNTER — Other Ambulatory Visit: Payer: Self-pay

## 2020-07-18 ENCOUNTER — Ambulatory Visit
Admission: RE | Admit: 2020-07-18 | Discharge: 2020-07-18 | Disposition: A | Discharge status: Home / Self Care | Payer: Commercial Managed Care - PPO | Source: Ambulatory Visit | Attending: Obstetrics and Gynecology | Admitting: Obstetrics and Gynecology

## 2020-07-18 ENCOUNTER — Other Ambulatory Visit: Payer: Self-pay | Admitting: Obstetrics and Gynecology

## 2020-07-18 DIAGNOSIS — R928 Other abnormal and inconclusive findings on diagnostic imaging of breast: Secondary | ICD-10-CM

## 2020-08-09 ENCOUNTER — Other Ambulatory Visit: Payer: Self-pay | Admitting: Family Medicine

## 2020-09-01 IMAGING — MG DIGITAL SCREENING BILATERAL MAMMOGRAM WITH TOMO AND CAD
4 series · 4 of 12 positions shown · non-contrast
Comparison: Previous exam(s).

CLINICAL DATA: Screening.

EXAM:
DIGITAL SCREENING BILATERAL MAMMOGRAM WITH TOMO AND CAD

[R MLO synth-2D]
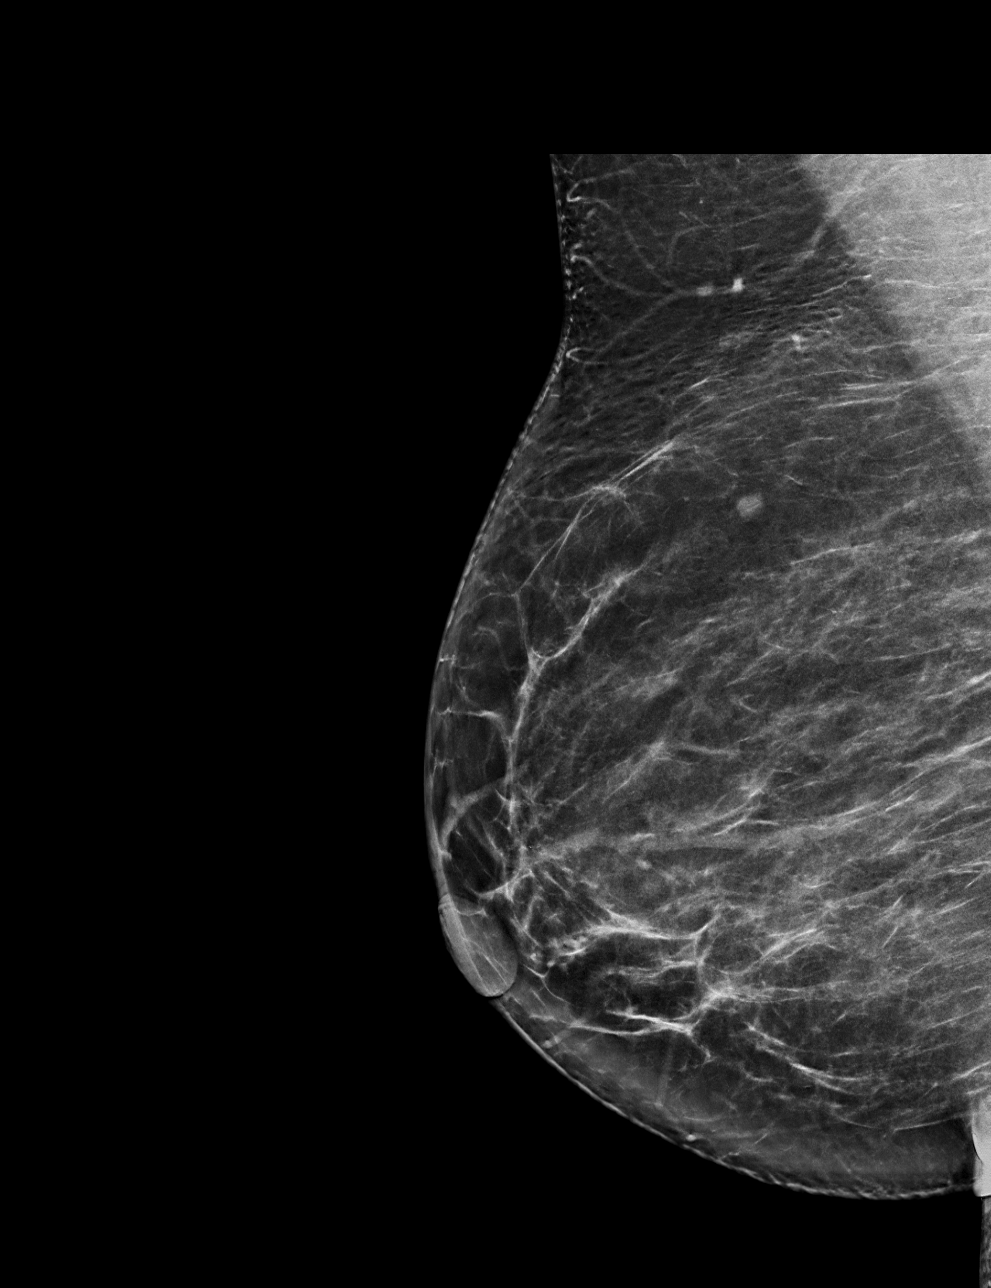

[R CC synth-2D]
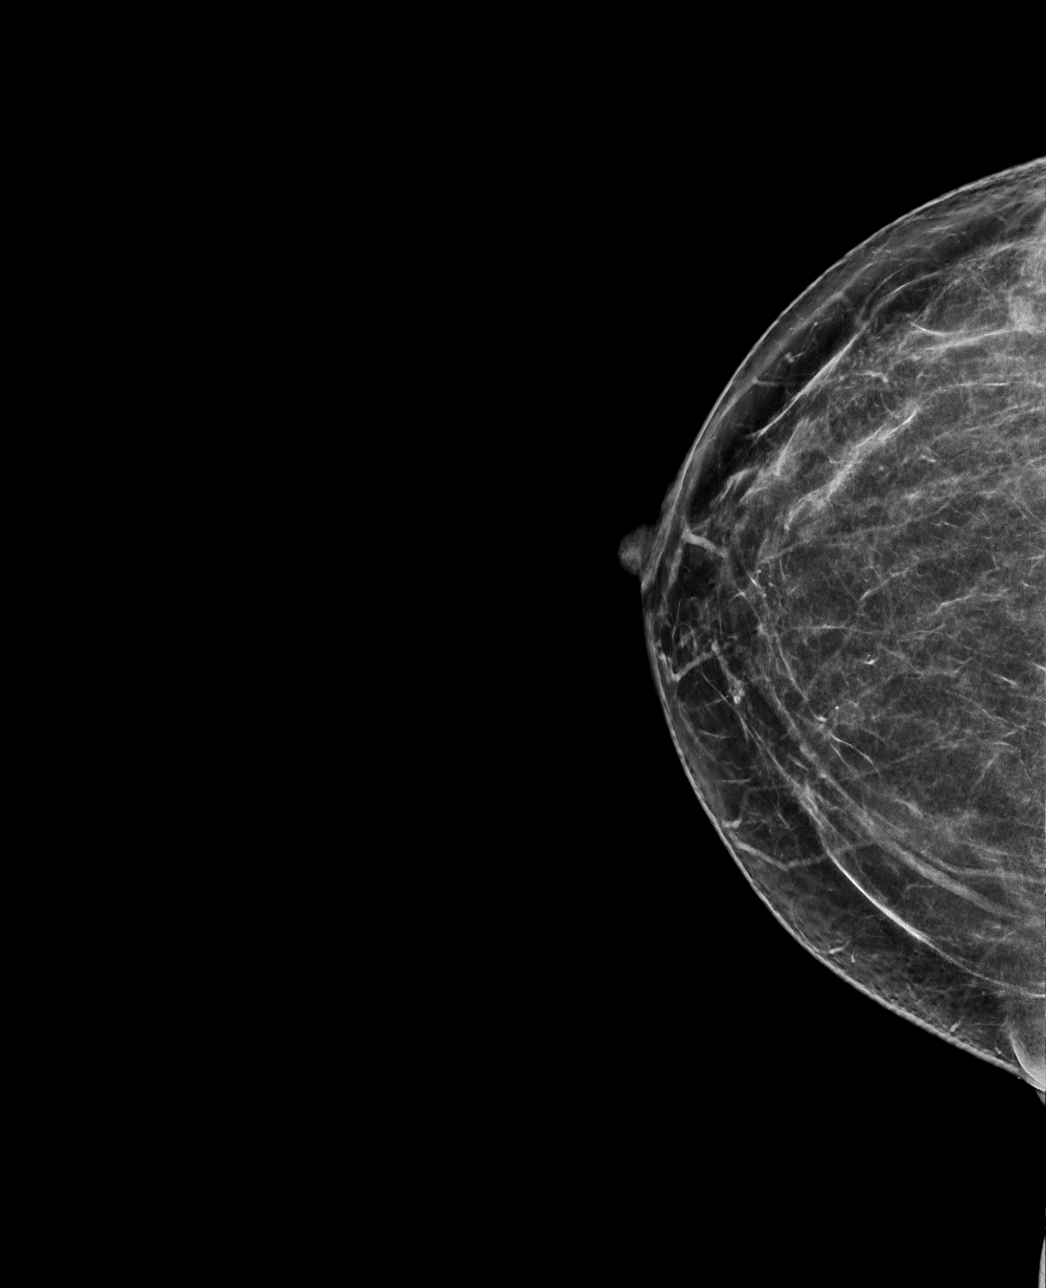

[L CC tomo · tomo slice 36/71.0]
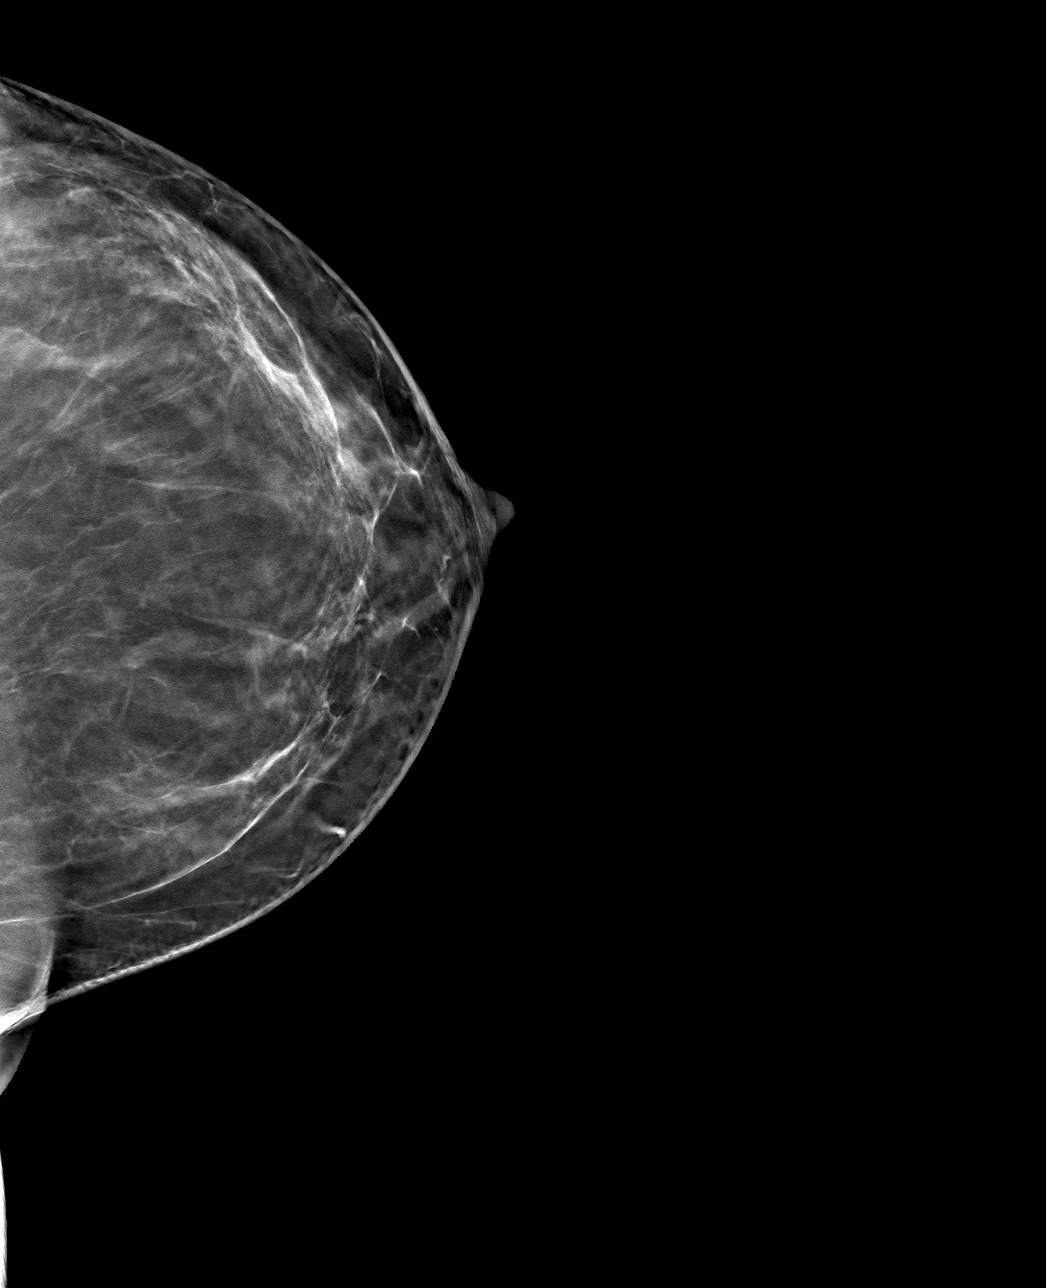

[L MLO tomo · tomo slice 45/90.0]
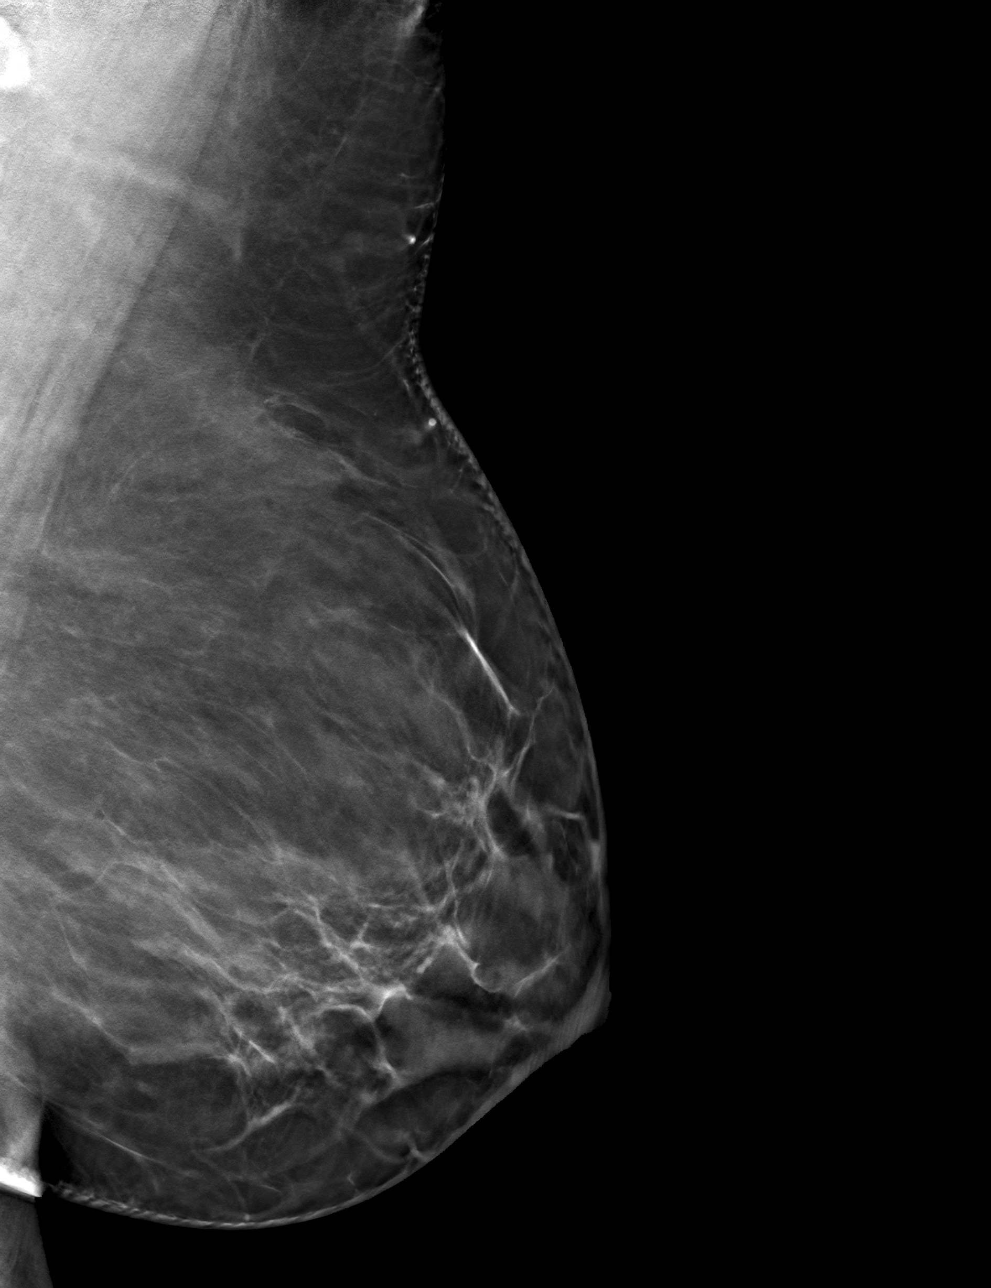

[4 of 12 positions shown; findings below may reference images not displayed]

ACR Breast Density Category b: There are scattered areas of
fibroglandular density.
FINDINGS: There are no findings suspicious for malignancy. Images were
processed with CAD.
IMPRESSION: No mammographic evidence of malignancy. A result letter of this
screening mammogram will be mailed directly to the patient.

RECOMMENDATION:
Screening mammogram in one year. (Code:[id number])

BI-RADS CATEGORY  1: Negative.

## 2020-09-17 ENCOUNTER — Encounter: Payer: Self-pay | Admitting: Nurse Practitioner

## 2020-09-17 ENCOUNTER — Other Ambulatory Visit: Payer: Self-pay

## 2020-09-17 ENCOUNTER — Ambulatory Visit: Payer: Commercial Managed Care - PPO | Admitting: Nurse Practitioner

## 2020-09-17 VITALS — BP 140/82 | HR 90 | Temp 97.9°F | Ht 66.0 in | Wt 209.4 lb

## 2020-09-17 DIAGNOSIS — E785 Hyperlipidemia, unspecified: Secondary | ICD-10-CM

## 2020-09-17 DIAGNOSIS — N951 Menopausal and female climacteric states: Secondary | ICD-10-CM | POA: Diagnosis not present

## 2020-09-17 DIAGNOSIS — Z79899 Other long term (current) drug therapy: Secondary | ICD-10-CM

## 2020-09-17 DIAGNOSIS — E1141 Type 2 diabetes mellitus with diabetic mononeuropathy: Secondary | ICD-10-CM | POA: Diagnosis not present

## 2020-09-17 DIAGNOSIS — D500 Iron deficiency anemia secondary to blood loss (chronic): Secondary | ICD-10-CM

## 2020-09-17 DIAGNOSIS — R0982 Postnasal drip: Secondary | ICD-10-CM

## 2020-09-17 DIAGNOSIS — E669 Obesity, unspecified: Secondary | ICD-10-CM

## 2020-09-17 DIAGNOSIS — H409 Unspecified glaucoma: Secondary | ICD-10-CM

## 2020-09-17 DIAGNOSIS — L659 Nonscarring hair loss, unspecified: Secondary | ICD-10-CM

## 2020-09-17 DIAGNOSIS — F411 Generalized anxiety disorder: Secondary | ICD-10-CM | POA: Diagnosis not present

## 2020-09-17 DIAGNOSIS — E1149 Type 2 diabetes mellitus with other diabetic neurological complication: Secondary | ICD-10-CM

## 2020-09-17 MED ORDER — METFORMIN HCL 1000 MG PO TABS: 1.0000 | ORAL_TABLET | Freq: Two times a day (BID) | ORAL | 1 refills | Status: DC

## 2020-09-17 MED ORDER — BENZONATATE 100 MG PO CAPS: 100.0000 mg | ORAL_CAPSULE | Freq: Two times a day (BID) | ORAL | 0 refills | Status: DC | PRN

## 2020-09-17 MED ORDER — ATORVASTATIN CALCIUM 40 MG PO TABS: 1.0000 | ORAL_TABLET | Freq: Every day | ORAL | 1 refills | Status: DC

## 2020-09-17 MED ORDER — ALPRAZOLAM 0.5 MG PO TABS: 0.5000 mg | ORAL_TABLET | Freq: Every evening | ORAL | 0 refills | Status: DC | PRN

## 2020-09-17 MED ORDER — SERTRALINE HCL 100 MG PO TABS: 100.0000 mg | ORAL_TABLET | Freq: Every day | ORAL | 1 refills | Status: DC

## 2020-09-17 NOTE — Progress Notes (Signed)
Subjective:    Patient ID: Kimberly Solis, female    DOB: [birth year], 60 y.o.   MRN: [id number]  HPI: [name] is a 60 y.o. female presenting for follow up with multiple concerns.  Chief Complaint  Patient presents with  . Diabetes  . Alopecia  . WEIGHTLOSS    Unintentional weight loss about 30lb   UNINTENTIONAL WEIGHT LOSS Duration: months Amount of weight loss: 30 lbs since December Fevers: no Decreased appetite: yes Night sweats: yes; hot flashes Dysphagia/odynophagia: no Chest pain: no Shortness of breath: no Cough: yes; dry - throat feels tickly - weeks Nausea: no Vomiting: no Abdominal pain: no Blood in stool: no Easy bruising/bleeding: yes; since postmenopausal bleeding Jaundice: no Polydipsia/polyuria: no Depression: no Previous colonoscopy: yes; march 2020  Of note, she has been taking Ozempic for diabetes since October 2021.  ANXIETY/STRESS Currently taking sertraline 100 mg daily ; takes alprazolam 1/2 tablet of 0.5 mg nightly prn; takes on average about 3 nights per week Duration: Chronic -controlled  Patient reports she is currently keeping her 48-monthold grandson and does not have time to be tired.  She has felt increased stress related to the ongoing postmenopausal bleeding that she has been having and the medication she has been taking for that.  ANEMIA Has postmenopausal bleeding since December 2021.  Reports she has been on 3 different medications-the last 1 apparently stop the bleeding after about 1 week.  Last Thursday, she had a D&C with a cervical biopsy due to abnormal Pap smear.  She follows up with GYN in 2 weeks.  She reports she is still taking her iron supplementation.  She is also taking a daily multivitamin and oral biotin.  Anemia status: controlled Etiology of anemia: Duration of anemia treatment: months Compliance with treatment: good Iron supplementation side effects: no Severity of anemia: moderate Fatigue: no ;  keeps 163month old grandson Decreased exercise tolerance: no  Dyspnea on exertion: no Palpitations: no Bleeding: yes ; spotting since D&C Pica: no  HYPERLIPIDEMIA Hyperlipidemia status: previously increase in cholesterol levels due to stopping statin Satisfied with current treatment?  yes Side effects:  no Medication compliance: excellent compliance  Aspirin:  no The 10-year ASCVD risk score (Mikey BussingDC Jr., et al., 2013) is: 9.2%   Values used to calculate the score:     Age: [age]years     Sex: Female     Is Non-Hispanic African American: No     Diabetic: Yes     Tobacco smoker: No     Systolic Blood Pressure: 1132mmHg     Is BP treated: No     HDL Cholesterol: 44 mg/dL     Total Cholesterol: 243 mg/dL Chest pain:  no  DIABETES Last hemoglobin in January 2022 was 6.9%.  She is currently taking Ozempic 0.25 mg weekly, and metformin 1000 mg twice daily for diabetes. Hypoglycemic episodes:no Polydipsia/polyuria: no Visual disturbance: no; does have "early glaucoma" Chest pain: no Paresthesias: yes; pinky toes on each side, not bothersome Taking Insulin?: no Retinal Examination: Up to Date Foot Exam: Up to Date Diabetic Education: Completed Pneumovax: Up to Date Influenza: Up to Date Aspirin: no  Allergies  Allergen Reactions  . Doxycycline Shortness Of Breath  . Mesalamine Er     SOB, Dizziness    Outpatient Encounter Medications as of 09/17/2020  Medication Sig Note  . Adalimumab (HUMIRA) 40 MG/0.8ML PSKT Inject into the skin. QOW   . benzonatate (TESSALON) 100 MG capsule  Take 1 capsule (100 mg total) by mouth 2 (two) times daily as needed for cough. Take first dose at night time and monitor for drowsiness; if this medication makes you drowsy, do not take while driving or operating heavy machinery   . BIOTIN PO Take by mouth.   . Blood Glucose Monitoring Suppl (BLOOD GLUCOSE SYSTEM PAK) KIT Please dispense based on patient and insurance preference. Use as directed to  monitor FSBS 2x daily. Dx: E11.9   . Coenzyme Q10 (CO Q-10 PO) Take by mouth.   . ferrous sulfate 325 (65 FE) MG tablet Take 325 mg by mouth daily with breakfast.   . Multiple Vitamin (MULTIVITAMIN) tablet Take 1 tablet by mouth daily. [facility] Women[facility] +50   . Multiple Vitamins-Minerals (WOMENS MULTIVITAMIN PO) Take by mouth.   Glory Rosebush Delica Lancets 48G MISC USE AS DIRECTED TO MONITOR FSBS 2X DAILY   . ONETOUCH ULTRA test strip USE AS DIRECTED TO MONITOR FSBS TWICE A DAY   . Semaglutide,0.25 or 0.5MG/DOS, (OZEMPIC, 0.25 OR 0.5 MG/DOSE,) 2 MG/1.5ML SOPN Inject 0.25 mg into the skin once a week.   . [DISCONTINUED] ALPRAZolam (XANAX) 0.5 MG tablet Take 1 tablet (0.5 mg total) by mouth 2 (two) times daily as needed for sleep.   . [DISCONTINUED] atorvastatin (LIPITOR) 40 MG tablet TAKE 1 TABLET BY MOUTH EVERY DAY   . [DISCONTINUED] Coenzyme Q10 (COQ10 PO) Take 1 tablet by mouth daily.   . [DISCONTINUED] estradiol (VIVELLE-DOT) 0.05 MG/24HR patch Place 1 patch onto the skin 2 (two) times a week.   . [DISCONTINUED] metFORMIN (GLUCOPHAGE) 1000 MG tablet TAKE 1 TABLET BY MOUTH TWICE A DAY   . [DISCONTINUED] progesterone (PROMETRIUM) 100 MG capsule Take 100 mg by mouth at bedtime. 08/31/2014: Received from: External Pharmacy  . [DISCONTINUED] sertraline (ZOLOFT) 100 MG tablet TAKE 1 AND 1/2 TABLETS BY MOUTH DAILY   . ALPRAZolam (XANAX) 0.5 MG tablet Take 1 tablet (0.5 mg total) by mouth at bedtime as needed for sleep.   Marland Kitchen atorvastatin (LIPITOR) 40 MG tablet Take 1 tablet (40 mg total) by mouth daily.   . metFORMIN (GLUCOPHAGE) 1000 MG tablet Take 1 tablet (1,000 mg total) by mouth 2 (two) times daily.   . sertraline (ZOLOFT) 100 MG tablet Take 1 tablet (100 mg total) by mouth daily.   . [DISCONTINUED] lisinopril (ZESTRIL) 2.5 MG tablet TAKE 1 TABLET (2.5 MG TOTAL) BY MOUTH DAILY. TO PROTECT YOUR KIDNEYS    No facility-administered encounter medications on file as of 09/17/2020.    Patient Active Problem  List   Diagnosis Date Noted  . PND (post-nasal drip) 09/17/2020  . Normocytic anemia due to blood loss 09/17/2020  . Controlled substance agreement signed 09/17/2020  . Glaucoma (increased eye pressure) 06/01/2020  . Post menopausal syndrome 05/30/2019  . Obesity (BMI 30-39.9) 01/25/2019  . Diabetic neuropathy ([place]) 03/15/2015  . Type II diabetes mellitus with neurological manifestations ([place]) 03/15/2015  . Hyperlipidemia   . GAD (generalized anxiety disorder)   . Ulcerative colitis ([place])     Past Medical History:  Diagnosis Date  . Anxiety   . Diabetes mellitus without complication ([place])   . Hyperlipidemia   . Hypertension   . Ulcerative colitis ([place]) 05/05/2005    Relevant past medical, surgical, family and social history reviewed and updated as indicated. Interim medical history since our last visit reviewed.  Review of Systems  Constitutional: Positive for chills, diaphoresis and unexpected weight change. Negative for activity change, appetite change, fatigue and fever.  Endocrine: Negative.  Negative for cold intolerance, heat intolerance, polydipsia and polyuria.   Per HPI unless specifically indicated above     Objective:    BP 140/82   Pulse 90   Temp 97.9 F (36.6 C)   Ht _0  (1.676 m)   Wt 209 lb 6.4 oz (95 kg)   LMP  (LMP Unknown)   SpO2 98%   BMI 33.80 kg/m   Wt Readings from Last 3 Encounters:  09/17/20 209 lb 6.4 oz (95 kg)  06/01/20 225 lb (102.1 kg)  01/31/20 241 lb (109.3 kg)    Physical Exam Vitals and nursing note reviewed.  Constitutional:      General: She is not in acute distress.    Appearance: Normal appearance. She is obese. She is not toxic-appearing.  HENT:     Head: Normocephalic and atraumatic.     Right Ear: Tympanic membrane, ear canal and external ear normal.     Left Ear: Tympanic membrane, ear canal and external ear normal.     Nose: Nose normal. No congestion.     Mouth/Throat:     Mouth: Mucous membranes are moist.      Pharynx: Posterior oropharyngeal erythema present. No oropharyngeal exudate.  Eyes:     General: No scleral icterus.       Right eye: No discharge.        Left eye: No discharge.     Extraocular Movements: Extraocular movements intact.     Pupils: Pupils are equal, round, and reactive to light.  Cardiovascular:     Rate and Rhythm: Normal rate and regular rhythm.     Heart sounds: Normal heart sounds. No murmur heard.   Pulmonary:     Effort: Pulmonary effort is normal. No respiratory distress.     Breath sounds: Normal breath sounds. No wheezing, rhonchi or rales.  Abdominal:     General: Abdomen is flat. Bowel sounds are normal.     Palpations: Abdomen is soft.  Musculoskeletal:        General: Normal range of motion.     Cervical back: Normal range of motion and neck supple. No tenderness.     Right lower leg: No edema.     Left lower leg: No edema.  Lymphadenopathy:     Cervical: No cervical adenopathy.  Skin:    General: Skin is warm.     Capillary Refill: Capillary refill takes less than 2 seconds.     Coloration: Skin is not jaundiced or pale.     Findings: No erythema.  Neurological:     General: No focal deficit present.     Mental Status: She is alert and oriented to person, place, and time.     Motor: No weakness.     Gait: Gait normal.  Psychiatric:        Mood and Affect: Mood normal.        Behavior: Behavior normal.        Thought Content: Thought content normal.        Judgment: Judgment normal.       Assessment & Plan:   Problem List Items Addressed This Visit      Endocrine   Type II diabetes mellitus with neurological manifestations ([place]) - Primary    Chronic.  Last A1c in January 2022 was 6.9%.  Currently maintained on Ozempic 0.25 mg weekly and metformin 1000 mg twice daily.  Eye exam is up-to-date and foot exam is up-to-date.  Urine microalbumin is  up-to-date.  We will recheck hemoglobin A1c today and may need to increase Ozempic pending results.   Question if unintentional weight loss is due to addition of Ozempic-discussed this with the patient.  Plan to follow-up in 4 months for recheck.      Relevant Medications   metFORMIN (GLUCOPHAGE) 1000 MG tablet   atorvastatin (LIPITOR) 40 MG tablet   Other Relevant Orders   Hemoglobin A1c   CBC with Differential   COMPLETE METABOLIC PANEL WITH GFR   Diabetic neuropathy ([facility])    Chronic.  Patient reports neuropathy in bilateral feet-fifth digits.  Not bothersome-does not affect ability to do ADLs or to function.  We will continue to closely monitor.      Relevant Medications   metFORMIN (GLUCOPHAGE) 1000 MG tablet   atorvastatin (LIPITOR) 40 MG tablet     Other   Post menopausal syndrome    With bleeding since December 2021.  Follows closely with GYN- had appointment last week with biopsy and results are pending.  Continue to collaborate closely with GYN.      PND (post-nasal drip)    Acute, however going on for weeks.  Question if related to allergies.  Already taking intranasal steroid and unable to take antihistamine due to glaucoma.  No signs or symptoms of pneumonia today-lungs clear to auscultation, no fevers, no change in appetite or malaise.  We will hold off on chest x-ray for now and start Tessalon Perles.  If this does not improve cough, can consider chest x-ray.      Relevant Medications   benzonatate (TESSALON) 100 MG capsule   Obesity (BMI 30-39.9)    Patient's weight continues to decline with addition of GLP-1 therapy.  However, she is slightly concerned given other symptoms including vaginal bleeding, hot flashes, hair loss and decreased appetite.  Discussed that GLP-1 helps promote weight loss by enhancing fullness and this is expected to decreased appetite.  I question if some of the symptoms are related to postmenopausal syndrome versus anemia related to bleeding.  Recommend continuing GLP-1 therapy for now.      Relevant Medications   metFORMIN (GLUCOPHAGE) 1000  MG tablet   Normocytic anemia due to blood loss    Maintains on daily iron-does not have any constipation.  Continue daily iron for now and will recheck blood counts with anemia panel today.  Continue close collaboration with GYN for postmenopausal bleeding.      Relevant Medications   ferrous sulfate 325 (65 FE) MG tablet   Other Relevant Orders   CBC with Differential   TSH   Iron, TIBC and Ferritin Panel   Hyperlipidemia    Chronic.  Last cholesterol levels showed increase, however patient was not taking atorvastatin consistently.  Reports for the past 3 months, she has been taking atorvastatin consistently and is fasting today so we will recheck lipid panel.  If remains elevated, can consider increasing atorvastatin to 80 mg daily.  Follow-up in 4 months.      Relevant Medications   atorvastatin (LIPITOR) 40 MG tablet   Other Relevant Orders   Lipid Panel   Glaucoma (increased eye pressure)    Follows closely with ophthalmologist.  Continue this collaboration.      GAD (generalized anxiety disorder)    Chronic.  Maintained on sertraline 100 mg daily for anxiety.  Also takes alprazolam 1/2 tablet 0.5 mg nightly as needed to help sleep.  States she uses roughly 3 times weekly.  PDMP reviewed and appropriate-refill given today.  Continue sertraline with as needed alprazolam to help sleep.  Controlled substance agreement signed for alprazolam today.  Follow-up in 4 months.      Relevant Medications   sertraline (ZOLOFT) 100 MG tablet   ALPRAZolam (XANAX) 0.5 MG tablet   Controlled substance agreement signed    Other Visit Diagnoses    Hair loss       Question if related to anemia.  We will check thyroid today, however no other symptoms of hyperthyroidism.  No bald patches noted -hair with even distribution.   Relevant Orders   TSH       Follow up plan: Return in about 4 months (around 01/18/2021) for DM, HLD, Mood.

## 2020-09-17 NOTE — Assessment & Plan Note (Signed)
Acute, however going on for weeks.  Question if related to allergies.  Already taking intranasal steroid and unable to take antihistamine due to glaucoma.  No signs or symptoms of pneumonia today-lungs clear to auscultation, no fevers, no change in appetite or malaise.  We will hold off on chest x-ray for now and start Tessalon Perles.  If this does not improve cough, can consider chest x-ray.

## 2020-09-17 NOTE — Assessment & Plan Note (Signed)
Follows closely with ophthalmologist.  Continue this collaboration.

## 2020-09-17 NOTE — Assessment & Plan Note (Signed)
Patient's weight continues to decline with addition of GLP-1 therapy.  However, she is slightly concerned given other symptoms including vaginal bleeding, hot flashes, hair loss and decreased appetite.  Discussed that GLP-1 helps promote weight loss by enhancing fullness and this is expected to decreased appetite.  I question if some of the symptoms are related to postmenopausal syndrome versus anemia related to bleeding.  Recommend continuing GLP-1 therapy for now.

## 2020-09-17 NOTE — Assessment & Plan Note (Signed)
Chronic.  Last A1c in January 2022 was 6.9%.  Currently maintained on Ozempic 0.25 mg weekly and metformin 1000 mg twice daily.  Eye exam is up-to-date and foot exam is up-to-date.  Urine microalbumin is up-to-date.  We will recheck hemoglobin A1c today and may need to increase Ozempic pending results.  Question if unintentional weight loss is due to addition of Ozempic-discussed this with the patient.  Plan to follow-up in 4 months for recheck.

## 2020-09-17 NOTE — Assessment & Plan Note (Signed)
Chronic.  Maintained on sertraline 100 mg daily for anxiety.  Also takes alprazolam 1/2 tablet 0.5 mg nightly as needed to help sleep.  States she uses roughly 3 times weekly.  PDMP reviewed and appropriate-refill given today.  Continue sertraline with as needed alprazolam to help sleep.  Controlled substance agreement signed for alprazolam today.  Follow-up in 4 months.

## 2020-09-17 NOTE — Assessment & Plan Note (Signed)
Maintains on daily iron-does not have any constipation.  Continue daily iron for now and will recheck blood counts with anemia panel today.  Continue close collaboration with GYN for postmenopausal bleeding.

## 2020-09-17 NOTE — Assessment & Plan Note (Signed)
Chronic.  Last cholesterol levels showed increase, however patient was not taking atorvastatin consistently.  Reports for the past 3 months, she has been taking atorvastatin consistently and is fasting today so we will recheck lipid panel.  If remains elevated, can consider increasing atorvastatin to 80 mg daily.  Follow-up in 4 months.

## 2020-09-17 NOTE — Patient Instructions (Signed)
Textbook of family medicine (9th ed., pp. 1062-1073). [place], [place]: [employer].">  Stress, Adult Stress is a normal reaction to life events. Stress is what you feel when life demands more than you are used to, or more than you think you can handle. Some stress can be useful, such as studying for a test or meeting a deadline at work. Stress that occurs too often or for too long can cause problems. It can affect your emotional health and interfere with relationships and normal daily activities. Too much stress can weaken your body's defense system (immune system) and increase your risk for physical illness. If you already have a medical problem, stress can make it worse. What are the causes? All sorts of life events can cause stress. An event that causes stress for one person may not be stressful for another person. Major life events, whether positive or negative, commonly cause stress. Examples include:  Losing a job or starting a new job.  Losing a loved one.  Moving to a new town or home.  Getting married or divorced.  Having a baby.  Getting injured or sick. Less obvious life events can also cause stress, especially if they occur day after day or in combination with each other. Examples include:  Working long hours.  Driving in traffic.  Caring for children.  Being in debt.  Being in a difficult relationship. What are the signs or symptoms? Stress can cause emotional symptoms, including:  Anxiety. This is feeling worried, afraid, on edge, overwhelmed, or out of control.  Anger, including irritation or impatience.  Depression. This is feeling sad, down, helpless, or guilty.  Trouble focusing, remembering, or making decisions. Stress can cause physical symptoms, including:  Aches and pains. These may affect your head, neck, back, stomach, or other areas of your body.  Tight muscles or a clenched jaw.  Low energy.  Trouble sleeping. Stress can cause unhealthy  behaviors, including:  Eating to feel better (overeating) or skipping meals.  Working too much or putting off tasks.  Smoking, drinking alcohol, or using drugs to feel better. How is this diagnosed? Stress is diagnosed through an assessment by your health care provider. He or she may diagnose this condition based on:  Your symptoms and any stressful life events.  Your medical history.  Tests to rule out other causes of your symptoms. Depending on your condition, your health care provider may refer you to a specialist for further evaluation. How is this treated? Stress management techniques are the recommended treatment for stress. Medicine is not typically recommended for the treatment of stress. Techniques to reduce your reaction to stressful life events include:  Stress identification. Monitor yourself for symptoms of stress and identify what causes stress for you. These skills may help you to avoid or prepare for stressful events.  Time management. Set your priorities, keep a calendar of events, and learn to say no. Taking these actions can help you avoid making too many commitments. Techniques for coping with stress include:  Rethinking the problem. Try to think realistically about stressful events rather than ignoring them or overreacting. Try to find the positives in a stressful situation rather than focusing on the negatives.  Exercise. Physical exercise can release both physical and emotional tension. The key is to find a form of exercise that you enjoy and do it regularly.  Relaxation techniques. These relax the body and mind. The key is to find one or more that you enjoy and use the techniques regularly. Examples include: ?   Meditation, deep breathing, or progressive relaxation techniques. ? Yoga or tai chi. ? Biofeedback, mindfulness techniques, or journaling. ? Listening to music, being out in nature, or participating in other hobbies.  Practicing a healthy lifestyle.  Eat a balanced diet, drink plenty of water, limit or avoid caffeine, and get plenty of sleep.  Having a strong support network. Spend time with family, friends, or other people you enjoy being around. Express your feelings and talk things over with someone you trust. Counseling or talk therapy with a mental health professional may be helpful if you are having trouble managing stress on your own.   Follow these instructions at home: Lifestyle  Avoid drugs.  Do not use any products that contain nicotine or tobacco, such as cigarettes, e-cigarettes, and chewing tobacco. If you need help quitting, ask your health care provider.  Limit alcohol intake to no more than 1 drink a day for nonpregnant women and 2 drinks a day for men. One drink equals 12 oz of beer, 5 oz of wine, or 1 oz of hard liquor  Do not use alcohol or drugs to relax.  Eat a balanced diet that includes fresh fruits and vegetables, whole grains, lean meats, fish, eggs, and beans, and low-fat dairy. Avoid processed foods and foods high in added fat, sugar, and salt.  Exercise at least 30 minutes on 5 or more days each week.  Get 7-8 hours of sleep each night.   General instructions  Practice stress management techniques as discussed with your health care provider.  Drink enough fluid to keep your urine clear or pale yellow.  Take over-the-counter and prescription medicines only as told by your health care provider.  Keep all follow-up visits as told by your health care provider. This is important.   Contact a health care provider if:  Your symptoms get worse.  You have new symptoms.  You feel overwhelmed by your problems and can no longer manage them on your own. Get help right away if:  You have thoughts of hurting yourself or others. If you ever feel like you may hurt yourself or others, or have thoughts about taking your own life, get help right away. You can go to your nearest emergency department or  call:  Your local emergency services (911 in the [place]  A suicide crisis helpline, such as the National Suicide Prevention Lifeline at 1-800-273-8255. This is open 24 hours a day. Summary  Stress is a normal reaction to life events. It can cause problems if it happens too often or for too long.  Practicing stress management techniques is the best way to treat stress.  Counseling or talk therapy with a mental health professional may be helpful if you are having trouble managing stress on your own. This information is not intended to replace advice given to you by your health care provider. Make sure you discuss any questions you have with your health care provider. Document Revised: 01/06/2020 Document Reviewed: 01/06/2020 Elsevier Patient Education  2021 [employer]  

## 2020-09-17 NOTE — Assessment & Plan Note (Signed)
Chronic.  Patient reports neuropathy in bilateral feet-fifth digits.  Not bothersome-does not affect ability to do ADLs or to function.  We will continue to closely monitor.

## 2020-09-17 NOTE — Assessment & Plan Note (Signed)
With bleeding since December 2021.  Follows closely with GYN- had appointment last week with biopsy and results are pending.  Continue to collaborate closely with GYN.

## 2020-09-18 LAB — LIPID PANEL
Cholesterol: 187 mg/dL (ref <200)
HDL: 52 mg/dL (ref >=50)
LDL Cholesterol (Calc): 105 mg/dL (calc) — ABNORMAL HIGH
Non-HDL Cholesterol (Calc): 135 mg/dL (calc) — ABNORMAL HIGH (ref <130)
Total CHOL/HDL Ratio: 3.6 (calc) (ref <5.0)
Triglycerides: 179 mg/dL — ABNORMAL HIGH (ref <150)

## 2020-09-18 LAB — CBC WITH DIFFERENTIAL/PLATELET
Absolute Monocytes: 770 cells/uL (ref 200–950)
Basophils Absolute: 75 cells/uL (ref 0–200)
Basophils Relative: 0.7 %
Eosinophils Absolute: 171 cells/uL (ref 15–500)
Eosinophils Relative: 1.6 %
HCT: 40.7 % (ref 35.0–45.0)
Hemoglobin: 12.7 g/dL (ref 11.7–15.5)
Lymphs Abs: 2900 cells/uL (ref 850–3900)
MCH: 26.8 pg — ABNORMAL LOW (ref 27.0–33.0)
MCHC: 31.2 g/dL — ABNORMAL LOW (ref 32.0–36.0)
MCV: 85.9 fL (ref 80.0–100.0)
MPV: 10.1 fL (ref 7.5–12.5)
Monocytes Relative: 7.2 %
Neutro Abs: 6784 cells/uL (ref 1500–7800)
Neutrophils Relative %: 63.4 %
Platelets: 445 10*3/uL — ABNORMAL HIGH (ref 140–400)
RBC: 4.74 10*6/uL (ref 3.80–5.10)
RDW: 12.5 % (ref 11.0–15.0)
Total Lymphocyte: 27.1 %
WBC: 10.7 10*3/uL (ref 3.8–10.8)

## 2020-09-18 LAB — COMPLETE METABOLIC PANEL WITH GFR
AG Ratio: 1.4 (calc) (ref 1.0–2.5)
ALT: 13 U/L (ref 6–29)
AST: 15 U/L (ref 10–35)
Albumin: 4.5 g/dL (ref 3.6–5.1)
Alkaline phosphatase (APISO): 80 U/L (ref 37–153)
BUN: 12 mg/dL (ref 7–25)
CO2: 29 mmol/L (ref 20–32)
Calcium: 10.7 mg/dL — ABNORMAL HIGH (ref 8.6–10.4)
Chloride: 102 mmol/L (ref 98–110)
Creat: 0.79 mg/dL (ref 0.50–1.05)
GFR, Est African American: 95 mL/min/{1.73_m2} (ref >=60)
GFR, Est Non African American: 82 mL/min/{1.73_m2} (ref >=60)
Globulin: 3.2 g/dL (calc) (ref 1.9–3.7)
Glucose, Bld: 155 mg/dL — ABNORMAL HIGH (ref 65–99)
Potassium: 5 mmol/L (ref 3.5–5.3)
Sodium: 141 mmol/L (ref 135–146)
Total Bilirubin: 0.2 mg/dL (ref 0.2–1.2)
Total Protein: 7.7 g/dL (ref 6.1–8.1)

## 2020-09-18 LAB — IRON,TIBC AND FERRITIN PANEL
%SAT: 12 % (calc) — ABNORMAL LOW (ref 16–45)
Ferritin: 17 ng/mL (ref 16–232)
Iron: 49 ug/dL (ref 45–160)
TIBC: 396 mcg/dL (calc) (ref 250–450)

## 2020-09-18 LAB — HEMOGLOBIN A1C
Hgb A1c MFr Bld: 8.3 % of total Hgb — ABNORMAL HIGH (ref <5.7)
Mean Plasma Glucose: 192 mg/dL
eAG (mmol/L): 10.6 mmol/L

## 2020-09-18 LAB — TSH: TSH: 1.32 mIU/L (ref 0.40–4.50)

## 2020-09-18 NOTE — Addendum Note (Signed)
Addended by: Noemi Chapel A on: 09/18/2020 07:25 AM   Modules accepted: Orders

## 2020-11-17 ENCOUNTER — Other Ambulatory Visit: Payer: Self-pay | Admitting: Nurse Practitioner

## 2020-11-19 ENCOUNTER — Encounter: Payer: Self-pay | Admitting: Family Medicine

## 2020-11-19 ENCOUNTER — Other Ambulatory Visit: Payer: Self-pay

## 2020-11-19 ENCOUNTER — Ambulatory Visit: Payer: Commercial Managed Care - PPO | Admitting: Family Medicine

## 2020-11-19 VITALS — BP 138/82 | HR 110 | Temp 98.0°F | Resp 16 | Ht 66.0 in | Wt 202.0 lb

## 2020-11-19 DIAGNOSIS — L723 Sebaceous cyst: Secondary | ICD-10-CM

## 2020-11-19 MED ORDER — SULFAMETHOXAZOLE-TRIMETHOPRIM 800-160 MG PO TABS: 1.0000 | ORAL_TABLET | Freq: Two times a day (BID) | ORAL | 0 refills | Status: DC

## 2020-11-19 NOTE — Addendum Note (Signed)
Addended by: Sheral Flow on: 11/19/2020 10:26 AM   Modules accepted: Orders

## 2020-11-19 NOTE — Progress Notes (Signed)
Subjective:    Patient ID: Kimberly Solis, female    DOB: 1961-02-26, 60 y.o.   MRN: [id number]  HPI Patient reports a tender area in her left suprapubic area for the last week or so.  Beginning yesterday the area became red warm and very tender.  She appears to have had a sebaceous cyst that has ruptured and become inflamed.  The skin in that area is erythematous to slightly violet in color.  There is no significant warmth but there is quite a bit of tenderness to palpation.  There is also some fluctuance in the area.  She denies any fevers or chills. Past Medical History  Diagnosis Date   Diabetes mellitus without complication ([place])    Hypertension    Hyperlipidemia    Anxiety    Ulcerative colitis ([place]) 05/05/2005   No past surgical history on file. Current Outpatient Prescriptions on File Prior to Visit  Medication Sig Dispense Refill   ALPRAZolam (XANAX) 0.5 MG tablet Take 1 tablet (0.5 mg total) by mouth 2 (two) times daily as needed for sleep. 60 tablet 2   aspirin 81 MG tablet Take 81 mg by mouth daily.     Coenzyme Q10 (COQ10 PO) Take 1 tablet by mouth daily.     CRESTOR 20 MG tablet TAKE 1 TABLET BY MOUTH EVERY DAY 30 tablet 3   estradiol (MINIVELLE) 0.05 MG/24HR patch Place 1 patch onto the skin 2 (two) times a week.     gabapentin (NEURONTIN) 300 MG capsule Take 1 capsule (300 mg total) by mouth 3 (three) times daily. 90 capsule 3   glucose blood (ONE TOUCH ULTRA TEST) test strip 1 each by Other route 2 (two) times daily. Use as instructed 100 each 12   metFORMIN (GLUCOPHAGE) 1000 MG tablet TAKE 1 TABLET BY MOUTH TWICE A DAY 180 tablet 1   Multiple Vitamin (MULTIVITAMIN) tablet Take 1 tablet by mouth daily. [facility] +78     ONETOUCH DELICA LANCETS FINE MISC TEST SUGAR 2 TIMES A DAY 100 each 11   pioglitazone (ACTOS) 45 MG tablet Take 1 tablet (45 mg total) by mouth daily. 90 tablet 0   progesterone (PROMETRIUM) 100 MG capsule Take 100 mg by mouth at bedtime.  12    sertraline (ZOLOFT) 100 MG tablet TAKE 1 TABLET EVERY DAY 90 tablet 1   temazepam (RESTORIL) 30 MG capsule Take 30 mg by mouth at bedtime as needed.   3   No current facility-administered medications on file prior to visit.   Allergies  Allergen Reactions   Doxycycline Shortness Of Breath   Social History   Social History   Marital Status: Married    Spouse Name: N/A   Number of Children: N/A   Years of Education: N/A   Occupational History   Not on file.   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   She is married.   Her husband has lupus.   Her daughter was recently diagnosed with lupus.   Her mother has Parkinson's disease. Patient is involved with helping care for her.      Review of Systems  All other systems reviewed and are negative.  Objective:   Physical Exam Vitals reviewed.  Constitutional:      Appearance: Normal appearance. She is normal weight.  Cardiovascular:     Rate and Rhythm: Normal rate and regular rhythm.     Heart sounds: Normal heart sounds.  Pulmonary:     Breath sounds: Normal breath sounds.  Abdominal:    Neurological:     Mental Status: She is alert.          Assessment & Plan:  Inflamed sebaceous cyst - Plan: CULTURE, ROUTINE-ABSCESS Skin was anesthetized with 0.1% lidocaine with out epinephrine.  A 1 cm vertical incision was made directly in the center of the inflamed cyst sac.  Copious purulent material was released under pressure.  It was brown and foul-smelling.  I was able to express significant pus from the wound.  I then probed and cleaned the wound with Q-tip soaked in peroxide and then packed the wound with 6 inches of 1/4 inch iodoform gauze.  Wound care was  discussed.  A wound culture was sent.  I will start the patient on Bactrim double strength tablets twice daily for 7 days because I am concerned that this may have become secondarily infected.  Wound care was discussed.

## 2020-11-19 NOTE — Addendum Note (Signed)
Addended by: Sheral Flow on: 11/19/2020 10:35 AM   Modules accepted: Orders

## 2020-11-22 LAB — WOUND CULTURE
MICRO NUMBER:: 12130791
SPECIMEN QUALITY:: ADEQUATE

## 2020-12-31 ENCOUNTER — Other Ambulatory Visit: Payer: Self-pay | Admitting: *Deleted

## 2020-12-31 MED ORDER — OZEMPIC (0.25 OR 0.5 MG/DOSE) 2 MG/1.5ML ~~LOC~~ SOPN
0.2500 mg | PEN_INJECTOR | SUBCUTANEOUS | 1 refills | Status: DC
Start: 2020-12-31 — End: 2021-01-08

## 2021-01-03 ENCOUNTER — Telehealth: Payer: Self-pay | Admitting: *Deleted

## 2021-01-03 NOTE — Telephone Encounter (Signed)
Received request from pharmacy for [place] on [address].   PA submitted.   Dx: E11.9- DM  Your information has been sent to [facility].

## 2021-01-08 MED ORDER — OZEMPIC (0.25 OR 0.5 MG/DOSE) 2 MG/1.5ML ~~LOC~~ SOPN: 0.2500 mg | PEN_INJECTOR | SUBCUTANEOUS | 1 refills | Status: DC

## 2021-01-08 NOTE — Telephone Encounter (Signed)
Received PA determination.   PA [id number] approved- 01/03/2021- 05/04/2038.

## 2021-01-08 NOTE — Addendum Note (Signed)
Addended by: Sheral Flow on: 01/08/2021 04:17 PM   Modules accepted: Orders

## 2021-01-18 ENCOUNTER — Other Ambulatory Visit: Payer: Self-pay

## 2021-01-18 ENCOUNTER — Ambulatory Visit: Payer: Commercial Managed Care - PPO | Admitting: Nurse Practitioner

## 2021-01-18 ENCOUNTER — Encounter: Payer: Self-pay | Admitting: Nurse Practitioner

## 2021-01-18 VITALS — BP 114/82 | HR 80 | Ht 66.0 in | Wt 202.6 lb

## 2021-01-18 DIAGNOSIS — E1149 Type 2 diabetes mellitus with other diabetic neurological complication: Secondary | ICD-10-CM

## 2021-01-18 DIAGNOSIS — F411 Generalized anxiety disorder: Secondary | ICD-10-CM | POA: Diagnosis not present

## 2021-01-18 DIAGNOSIS — E785 Hyperlipidemia, unspecified: Secondary | ICD-10-CM | POA: Diagnosis not present

## 2021-01-18 DIAGNOSIS — Z23 Encounter for immunization: Secondary | ICD-10-CM | POA: Diagnosis not present

## 2021-01-18 DIAGNOSIS — K5010 Crohn's disease of large intestine without complications: Secondary | ICD-10-CM

## 2021-01-18 DIAGNOSIS — L723 Sebaceous cyst: Secondary | ICD-10-CM

## 2021-01-18 MED ORDER — ALPRAZOLAM 0.5 MG PO TABS: 0.5000 mg | ORAL_TABLET | Freq: Every evening | ORAL | 0 refills | Status: DC | PRN

## 2021-01-18 MED ORDER — SULFAMETHOXAZOLE-TRIMETHOPRIM 800-160 MG PO TABS: 1.0000 | ORAL_TABLET | Freq: Two times a day (BID) | ORAL | 0 refills | Status: DC

## 2021-01-18 NOTE — Assessment & Plan Note (Signed)
Chronic.  Reportedly stable with sertraline 100 mg daily and alprazolam as needed to help with sleep.  She takes alprazolam a few times weekly to help with anxiety related to insomnia.  She does not want to take this medication daily.  Her husband has upcoming surgery scheduled and she is worried about this, she will let me know if she wants to increase sertraline while under this increased level of stress.  Folllow up in 4 months.

## 2021-01-18 NOTE — Progress Notes (Signed)
Subjective:    Patient ID: Kimberly Solis, female    DOB: [birth year], 60 y.o.   MRN: [id number]  HPI: Kimberly Solis is a 60 y.o. female presenting for follow up.  Chief Complaint  Patient presents with   Follow-up    Follow up , still having issues from abscess    DIABETES Last A1c was 8.3 in May 2022.  We had plan to increase Ozempic to 0.5 mg weekly at that time. Hypoglycemic episodes:no Polydipsia/polyuria: no Visual disturbance: no Chest pain: no Paresthesias: no Glucose Monitoring: no Taking Insulin?: no Retinal Examination: Not up to Date; has scheduled Fasting blood sugar: 130-140 Foot Exam: Not up to Date Diabetic Education: Completed Pneumovax: Up to Date Influenza: Up to Date Aspirin:  no; cannot take with history of Crohn's Disease  HYPERLIPIDEMIA Currently taking atorvastatin 40 mg daily.  We had recommended increasing to 80 mg daily because LDL was not at goal of less than 70.  She is nervous to get muscle cramps with it.  Hyperlipidemia status: excellent compliance Satisfied with current treatment?  no Side effects:  no Medication compliance: excellent compliance Aspirin:  no; GI recommended not to The 10-year ASCVD risk score (Arnett DK, et al., 2019) is: 4.5%   Values used to calculate the score:     Age: 56 years     Sex: Female     Is Non-Hispanic African American: No     Diabetic: Yes     Tobacco smoker: No     Systolic Blood Pressure: 672 mmHg     Is BP treated: No     HDL Cholesterol: 52 mg/dL     Total Cholesterol: 187 mg/dL Chest pain:  no Coronary artery disease:  no  ANXIETY/STRESS Sertraline 100 mg daily is working well.  Takes xanax 1/2 tablet a few times per week to help with sleep.  Requesting refill today.  Duration: chronic  Hypercalcemia - last labs checked showed calcium 10.7.  We had wanted to recheck calcium level with PTH and vitamin D, however this did not get done.  SKIN INFECTION Duration: >6  weeks Location: pubic History of trauma in area: no Pain: no Quality: no Severity: no pain Redness: dark purple Swelling: no Oozing: yes Pus: yes Fevers: no Nausea/vomiting: no Status: stable Treatments attempted: antibiotics, I&D   Allergies  Allergen Reactions   Doxycycline Shortness Of Breath   Mesalamine Er     SOB, Dizziness    Outpatient Encounter Medications as of 01/18/2021  Medication Sig   Adalimumab (HUMIRA) 40 MG/0.8ML PSKT Inject into the skin. QOW   atorvastatin (LIPITOR) 40 MG tablet Take 1 tablet (40 mg total) by mouth daily.   BIOTIN PO Take by mouth.   Blood Glucose Monitoring Suppl (BLOOD GLUCOSE SYSTEM PAK) KIT Please dispense based on patient and insurance preference. Use as directed to monitor FSBS 2x daily. Dx: E11.9   Coenzyme Q10 (CO Q-10 PO) Take by mouth.   ferrous sulfate 325 (65 FE) MG tablet Take 325 mg by mouth daily with breakfast.   metFORMIN (GLUCOPHAGE) 1000 MG tablet Take 1 tablet (1,000 mg total) by mouth 2 (two) times daily.   Multiple Vitamin (MULTIVITAMIN) tablet Take 1 tablet by mouth daily. [facility] +50   Multiple Vitamins-Minerals (WOMENS MULTIVITAMIN PO) Take by mouth.   OneTouch Delica Lancets 09O MISC USE AS DIRECTED TO MONITOR FSBS 2X DAILY   ONETOUCH ULTRA test strip USE AS DIRECTED TO MONITOR FSBS TWICE A DAY  Semaglutide,0.25 or 0.5MG/DOS, (OZEMPIC, 0.25 OR 0.5 MG/DOSE,) 2 MG/1.5ML SOPN Inject 0.25 mg into the skin once a week.   sertraline (ZOLOFT) 100 MG tablet Take 1 tablet (100 mg total) by mouth daily.   [DISCONTINUED] ALPRAZolam (XANAX) 0.5 MG tablet Take 1 tablet (0.5 mg total) by mouth at bedtime as needed for sleep.   [DISCONTINUED] sulfamethoxazole-trimethoprim (BACTRIM DS) 800-160 MG tablet Take 1 tablet by mouth 2 (two) times daily.   ALPRAZolam (XANAX) 0.5 MG tablet Take 1 tablet (0.5 mg total) by mouth at bedtime as needed for sleep.   sulfamethoxazole-trimethoprim (BACTRIM DS) 800-160 MG tablet Take 1 tablet  by mouth 2 (two) times daily.   No facility-administered encounter medications on file as of 01/18/2021.    Patient Active Problem List   Diagnosis Date Noted   PND (post-nasal drip) 09/17/2020   Normocytic anemia due to blood loss 09/17/2020   Controlled substance agreement signed 09/17/2020   Glaucoma (increased eye pressure) 06/01/2020   Post menopausal syndrome 05/30/2019   Obesity (BMI 30-39.9) 01/25/2019   Diabetic neuropathy ([place]) 03/15/2015   Type II diabetes mellitus with neurological manifestations ([place]) 03/15/2015   Hyperlipidemia    GAD (generalized anxiety disorder)    Crohn's colitis ([place])     Past Medical History:  Diagnosis Date   Anxiety    Diabetes mellitus without complication ([place])    Hyperlipidemia    Hypertension    Ulcerative colitis ([place]) 05/05/2005    Relevant past medical, surgical, family and social history reviewed and updated as indicated. Interim medical history since our last visit reviewed.  Review of Systems Per HPI unless specifically indicated above     Objective:    BP 114/82   Pulse 80   Ht 5' 6"  (1.676 m)   Wt 202 lb 9.6 oz (91.9 kg)   LMP  (LMP Unknown)   SpO2 96%   BMI 32.70 kg/m   Wt Readings from Last 3 Encounters:  01/18/21 202 lb 9.6 oz (91.9 kg)  11/19/20 202 lb (91.6 kg)  09/17/20 209 lb 6.4 oz (95 kg)    Physical Exam Vitals and nursing note reviewed.  Constitutional:      General: She is not in acute distress.    Appearance: Normal appearance. She is obese. She is not toxic-appearing.  HENT:     Head: Normocephalic and atraumatic.  Eyes:     General: No scleral icterus.       Right eye: No discharge.        Left eye: No discharge.     Extraocular Movements: Extraocular movements intact.     Pupils: Pupils are equal, round, and reactive to light.  Cardiovascular:     Rate and Rhythm: Normal rate and regular rhythm.     Heart sounds: Normal heart sounds. No murmur heard. Pulmonary:     Effort: Pulmonary  effort is normal. No respiratory distress.     Breath sounds: Normal breath sounds. No wheezing, rhonchi or rales.  Abdominal:     General: Abdomen is flat. Bowel sounds are normal. There is no distension.     Palpations: Abdomen is soft.     Tenderness: There is no abdominal tenderness. There is no right CVA tenderness or left CVA tenderness.  Musculoskeletal:        General: Normal range of motion.     Cervical back: Normal range of motion and neck supple. No tenderness.     Right lower leg: No edema.     Left  lower leg: No edema.  Lymphadenopathy:     Cervical: No cervical adenopathy.  Skin:    General: Skin is warm.     Capillary Refill: Capillary refill takes less than 2 seconds.     Coloration: Skin is not jaundiced or pale.     Findings: Abscess present. No erythema.          Comments: Approximately 1 cm hyperpigmented area, dark red/purple in color.  0.4 cm incision noted, able to express some drainage.  Slightly tender to palpation, no fluctuance or surrounding warmth or erythema.  Neurological:     General: No focal deficit present.     Mental Status: She is alert and oriented to person, place, and time.     Motor: No weakness.     Gait: Gait normal.  Psychiatric:        Mood and Affect: Mood normal.        Behavior: Behavior normal.        Thought Content: Thought content normal.        Judgment: Judgment normal.      Assessment & Plan:   Problem List Items Addressed This Visit       Digestive   Crohn's colitis ([place])    Chronic.  Follows with GI and is currently taking Humira injections.  Continue collaboration.        Endocrine   Type II diabetes mellitus with neurological manifestations ([place]) - Primary    Chronic.  Will check A1c today.  Patient is tolerating Metformin 1000 mg twice daily and Ozempic 0.5 mg weekly well.  If A1c elevated above goal, will increase Ozempic to 1 mg weekly.  Eye exam is scheduled.  Will need foot exam at next appointment.  Urine  microalbumin today.  Patient is on a statin and goal LDL is less than 70, we discussed this today,  Follow up in 4 months.       Relevant Orders   Hemoglobin A1c   CBC with Differential/Platelet   B12 and Folate Panel   Microalbumin, urine     Other   Hyperlipidemia    Chronic.  Given concurrent diabetes, goal LDL is less than 70.  Continue atorvastatin 40 mg daily for now and will check lipids today - patient is fasting.  Follow up in 4 months.      GAD (generalized anxiety disorder)    Chronic.  Reportedly stable with sertraline 100 mg daily and alprazolam as needed to help with sleep.  She takes alprazolam a few times weekly to help with anxiety related to insomnia.  She does not want to take this medication daily.  Her husband has upcoming surgery scheduled and she is worried about this, she will let me know if she wants to increase sertraline while under this increased level of stress.  Folllow up in 4 months.      Relevant Medications   ALPRAZolam (XANAX) 0.5 MG tablet   Other Relevant Orders   COMPLETE METABOLIC PANEL WITH GFR   Other Visit Diagnoses     Need for influenza vaccination       Relevant Orders   Flu Vaccine QUAD 6+ mos PF IM (Fluarix Quad PF)   Hypercalcemia       Relevant Orders   COMPLETE METABOLIC PANEL WITH GFR   VITAMIN D 25 Hydroxy (Vit-D Deficiency, Fractures)   Inflamed sebaceous cyst       Relevant Medications   sulfamethoxazole-trimethoprim (BACTRIM DS) 800-160 MG tablet  Hypercalcemia Will recheck today with Vitamin D.  If calcium remains elevated, will recommend stopping calcium supplementation and rechecking.    - COMPLETE METABOLIC PANEL WITH GFR - VITAMIN D 25 Hydroxy (Vit-D Deficiency, Fractures)  Inflamed sebaceous cyst Greatly improved, however with ongoing draining and this has been present for >[redacted] weeks along with immunosuppression from Humira, will start another round of Bactrim to treat the secondary infection.  With no  improvement, consider complete removal of cyst sac with Dermatology or General Surgery.  Continue wound care.  - sulfamethoxazole-trimethoprim (BACTRIM DS) 800-160 MG tablet; Take 1 tablet by mouth 2 (two) times daily.  Dispense: 14 tablet; Refill: 0   Follow up plan: Return in about 4 months (around 05/20/2021) for follow up .

## 2021-01-18 NOTE — Assessment & Plan Note (Signed)
Chronic.  Follows with GI and is currently taking Humira injections.  Continue collaboration.

## 2021-01-18 NOTE — Assessment & Plan Note (Signed)
Chronic.  Given concurrent diabetes, goal LDL is less than 70.  Continue atorvastatin 40 mg daily for now and will check lipids today - patient is fasting.  Follow up in 4 months.

## 2021-01-18 NOTE — Assessment & Plan Note (Signed)
Chronic.  Will check A1c today.  Patient is tolerating Metformin 1000 mg twice daily and Ozempic 0.5 mg weekly well.  If A1c elevated above goal, will increase Ozempic to 1 mg weekly.  Eye exam is scheduled.  Will need foot exam at next appointment.  Urine microalbumin today.  Patient is on a statin and goal LDL is less than 70, we discussed this today,  Follow up in 4 months.

## 2021-01-19 LAB — CBC WITH DIFFERENTIAL/PLATELET
Absolute Monocytes: 774 cells/uL (ref 200–950)
Basophils Absolute: 70 cells/uL (ref 0–200)
Basophils Relative: 0.8 %
Eosinophils Absolute: 131 cells/uL (ref 15–500)
Eosinophils Relative: 1.5 %
HCT: 41.8 % (ref 35.0–45.0)
Hemoglobin: 13.3 g/dL (ref 11.7–15.5)
Lymphs Abs: 2488 cells/uL (ref 850–3900)
MCH: 28.5 pg (ref 27.0–33.0)
MCHC: 31.8 g/dL — ABNORMAL LOW (ref 32.0–36.0)
MCV: 89.7 fL (ref 80.0–100.0)
MPV: 10.2 fL (ref 7.5–12.5)
Monocytes Relative: 8.9 %
Neutro Abs: 5237 cells/uL (ref 1500–7800)
Neutrophils Relative %: 60.2 %
Platelets: 369 10*3/uL (ref 140–400)
RBC: 4.66 10*6/uL (ref 3.80–5.10)
RDW: 13.2 % (ref 11.0–15.0)
Total Lymphocyte: 28.6 %
WBC: 8.7 10*3/uL (ref 3.8–10.8)

## 2021-01-19 LAB — COMPLETE METABOLIC PANEL WITHOUT GFR
AG Ratio: 1.7 (calc) (ref 1.0–2.5)
ALT: 13 U/L (ref 6–29)
AST: 15 U/L (ref 10–35)
Albumin: 4.5 g/dL (ref 3.6–5.1)
Alkaline phosphatase (APISO): 77 U/L (ref 37–153)
BUN: 13 mg/dL (ref 7–25)
CO2: 27 mmol/L (ref 20–32)
Calcium: 9.6 mg/dL (ref 8.6–10.4)
Chloride: 103 mmol/L (ref 98–110)
Creat: 0.83 mg/dL (ref 0.50–1.03)
Globulin: 2.7 g/dL (ref 1.9–3.7)
Glucose, Bld: 124 mg/dL — ABNORMAL HIGH (ref 65–99)
Potassium: 4.5 mmol/L (ref 3.5–5.3)
Sodium: 139 mmol/L (ref 135–146)
Total Bilirubin: 0.4 mg/dL (ref 0.2–1.2)
Total Protein: 7.2 g/dL (ref 6.1–8.1)
eGFR: 81 mL/min/1.73m2

## 2021-01-19 LAB — MICROALBUMIN, URINE: Microalb, Ur: 0.7 mg/dL

## 2021-01-19 LAB — HEMOGLOBIN A1C
Hgb A1c MFr Bld: 6.7 % of total Hgb — ABNORMAL HIGH (ref <5.7)
Mean Plasma Glucose: 146 mg/dL
eAG (mmol/L): 8.1 mmol/L

## 2021-01-19 LAB — B12 AND FOLATE PANEL
Folate: >24 ng/mL
Vitamin B-12: 1564 pg/mL — ABNORMAL HIGH (ref 200–1100)

## 2021-01-19 LAB — VITAMIN D 25 HYDROXY (VIT D DEFICIENCY, FRACTURES): Vit D, 25-Hydroxy: 45 ng/mL (ref 30–100)

## 2021-01-21 ENCOUNTER — Ambulatory Visit
Admission: RE | Admit: 2021-01-21 | Discharge: 2021-01-21 | Disposition: A | Discharge status: Home / Self Care | Payer: Commercial Managed Care - PPO | Source: Ambulatory Visit | Attending: Obstetrics and Gynecology | Admitting: Obstetrics and Gynecology

## 2021-01-21 ENCOUNTER — Other Ambulatory Visit: Payer: Self-pay

## 2021-01-21 ENCOUNTER — Other Ambulatory Visit: Payer: Self-pay | Admitting: Obstetrics and Gynecology

## 2021-01-21 DIAGNOSIS — R928 Other abnormal and inconclusive findings on diagnostic imaging of breast: Secondary | ICD-10-CM

## 2021-01-28 LAB — HM PAP SMEAR

## 2021-02-23 ENCOUNTER — Other Ambulatory Visit: Payer: Self-pay | Admitting: Nurse Practitioner

## 2021-05-08 ENCOUNTER — Encounter: Payer: Self-pay | Admitting: Nurse Practitioner

## 2021-05-08 ENCOUNTER — Telehealth (INDEPENDENT_AMBULATORY_CARE_PROVIDER_SITE_OTHER): Payer: Commercial Managed Care - PPO | Admitting: Nurse Practitioner

## 2021-05-08 ENCOUNTER — Other Ambulatory Visit: Payer: Self-pay

## 2021-05-08 DIAGNOSIS — H1033 Unspecified acute conjunctivitis, bilateral: Secondary | ICD-10-CM | POA: Diagnosis not present

## 2021-05-08 DIAGNOSIS — J069 Acute upper respiratory infection, unspecified: Secondary | ICD-10-CM | POA: Diagnosis not present

## 2021-05-08 MED ORDER — SALINE SPRAY 0.65 % NA SOLN
1.0000 | NASAL | 0 refills | Status: DC | PRN
Start: 2021-05-08 — End: 2021-09-10

## 2021-05-08 MED ORDER — ERYTHROMYCIN 5 MG/GM OP OINT: 1.0000 "application " | TOPICAL_OINTMENT | Freq: Every day | OPHTHALMIC | 0 refills | Status: DC

## 2021-05-08 MED ORDER — BENZONATATE 200 MG PO CAPS: 200.0000 mg | ORAL_CAPSULE | Freq: Two times a day (BID) | ORAL | 0 refills | Status: DC | PRN

## 2021-05-08 NOTE — Progress Notes (Signed)
Subjective:    Patient ID: Kimberly Solis, female    DOB: 1960/06/17, 60 y.o.   MRN: [id number]  HPI: Kimberly Solis is a 61 y.o. female presenting virtually for sore throat.  Chief Complaint  Patient presents with   Sore Throat   URI   SORE THROAT Onset: 5 days ago COVID-19 testing history: no Fever: yes; 100 Body aches: no  Chills: yes Cough:  yes; congested Shortness of breath: no Wheezing: no Chest pain: no Chest tightness: no Chest congestion: yes Nasal congestion: yes Runny nose: yes Post nasal drip: yes Sneezing: no Sore throat: yes Swollen glands: yes Sinus pressure:  yes - "everywhere" Headache: no; better now  Face pain: yes Toothache: no Ear pain:  yes; right ear worse   Ear pressure: no  Eyes red/itching: yes Eye drainage/crusting: yes; clumpy/yellow Nausea: no  Vomiting: no Diarrhea: yes; worse with Chron's   Change in appetite: yes; decreased Loss of taste/smell: no  Rash: no Fatigue: yes Sick contacts: yes; grandson was sick last week Strep contacts: no  Context: stable Recurrent sinusitis: no Treatments attempted: Mucinex, honey/tea, warm water with lemon/honey, steam Relief with OTC medications: no  Allergies  Allergen Reactions   Doxycycline Shortness Of Breath   Azathioprine Nausea And Vomiting   Mesalamine Er     SOB, Dizziness    Outpatient Encounter Medications as of 05/08/2021  Medication Sig   benzonatate (TESSALON) 200 MG capsule Take 1 capsule (200 mg total) by mouth 2 (two) times daily as needed for cough.   erythromycin ophthalmic ointment Place 1 application into both eyes at bedtime.   sodium chloride (OCEAN) 0.65 % SOLN nasal spray Place 1 spray into both nostrils as needed for congestion.   Adalimumab (HUMIRA) 40 MG/0.8ML PSKT Inject into the skin. QOW   ALPRAZolam (XANAX) 0.5 MG tablet Take 1 tablet (0.5 mg total) by mouth at bedtime as needed for sleep.   atorvastatin (LIPITOR) 40 MG tablet Take 1 tablet  (40 mg total) by mouth daily.   BIOTIN PO Take by mouth.   Blood Glucose Monitoring Suppl (BLOOD GLUCOSE SYSTEM PAK) KIT Please dispense based on patient and insurance preference. Use as directed to monitor FSBS 2x daily. Dx: E11.9   Coenzyme Q10 (CO Q-10 PO) Take by mouth.   Estradiol (MINIVELLE TD) [place]   ferrous sulfate 325 (65 FE) MG tablet Take 325 mg by mouth daily with breakfast.   metFORMIN (GLUCOPHAGE) 1000 MG tablet Take 1 tablet (1,000 mg total) by mouth 2 (two) times daily.   Multiple Vitamin (MULTIVITAMIN) tablet Take 1 tablet by mouth daily. [facility] +50   Multiple Vitamins-Minerals (WOMENS MULTIVITAMIN PO) Take by mouth.   OneTouch Delica Lancets 26V MISC USE AS DIRECTED TO MONITOR FSBS 2X DAILY   ONETOUCH ULTRA test strip USE AS DIRECTED TO MONITOR FSBS TWICE A DAY   PROGESTERONE PO Progesterone   Semaglutide,0.25 or 0.5MG/DOS, (OZEMPIC, 0.25 OR 0.5 MG/DOSE,) 2 MG/1.5ML SOPN Inject 0.25 mg into the skin once a week.   sertraline (ZOLOFT) 100 MG tablet Take 1 tablet (100 mg total) by mouth daily.   [DISCONTINUED] sulfamethoxazole-trimethoprim (BACTRIM DS) 800-160 MG tablet Take 1 tablet by mouth 2 (two) times daily.   No facility-administered encounter medications on file as of 05/08/2021.    Patient Active Problem List   Diagnosis Date Noted   PND (post-nasal drip) 09/17/2020   Normocytic anemia due to blood loss 09/17/2020   Controlled substance agreement signed 09/17/2020   Glaucoma (  increased eye pressure) 06/01/2020   Post menopausal syndrome 05/30/2019   Obesity (BMI 30-39.9) 01/25/2019   Diabetic neuropathy ([place]) 03/15/2015   Type II diabetes mellitus with neurological manifestations ([place]) 03/15/2015   Hyperlipidemia    GAD (generalized anxiety disorder)    Crohn's colitis ([place])     Past Medical History:  Diagnosis Date   Anxiety    Diabetes mellitus without complication ([place])    Hyperlipidemia    Hypertension    Ulcerative colitis ([place]) 05/05/2005     Relevant past medical, surgical, family and social history reviewed and updated as indicated. Interim medical history since our last visit reviewed.  Review of Systems Per HPI unless specifically indicated above     Objective:    LMP  (LMP Unknown)   Wt Readings from Last 3 Encounters:  01/18/21 202 lb 9.6 oz (91.9 kg)  11/19/20 202 lb (91.6 kg)  09/17/20 209 lb 6.4 oz (95 kg)    Physical Exam Physical examination unable to be performed due to lack of equipment.  Patient talking in complete sentences during telemedicine visit.     Assessment & Plan:  1. Viral upper respiratory tract infection Acute x 5 days.  Encouraged COVID-19 testing at home - symptoms do sound consistent with current COVID-19 symptoms.  Offered viral testing, however patient declines for now.  She will try to do an at home COVID-19 test and will let me know if she tests positive because she is a good candidate for antiviral therapy.  Reassured patient that symptoms and are most consistent with a viral upper respiratory infection and explained lack of efficacy of antibiotics against viruses.  Discussed expected course and features suggestive of secondary bacterial infection.  Continue supportive care. Increase fluid intake with water or electrolyte solution like pedialyte. Encouraged acetaminophen as needed for fever/pain. Encouraged salt water gargling, chloraseptic spray and throat lozenges. Encouraged OTC guaifenesin. Encouraged saline sinus flushes and/or neti with humidified air.  If symptoms ongoing later this week without improvement, can send in Augmentin for patient to start on for bacterial sinusitis.   - SARS-CoV-2 RNA (COVID-19) and Respiratory Viral Panel, Qualitative NAAT - benzonatate (TESSALON) 200 MG capsule; Take 1 capsule (200 mg total) by mouth 2 (two) times daily as needed for cough.  Dispense: 30 capsule; Refill: 0 - sodium chloride (OCEAN) 0.65 % SOLN nasal spray; Place 1 spray into both  nostrils as needed for congestion.  Dispense: 88 mL; Refill: 0  2. Acute bacterial conjunctivitis of both eyes Acute.  Suspect bacterial conjunctivitis given recent exposure to grandchild who was treated for same.  Will treat with erythromycin ointment at bedtime for 7 night.  Follow up with no improvement in symptoms.   - erythromycin ophthalmic ointment; Place 1 application into both eyes at bedtime.  Dispense: 3.5 g; Refill: 0     Follow up plan: Return if symptoms worsen or fail to improve.   This visit was completed via telephone due to the restrictions of the COVID-19 pandemic. All issues as above were discussed and addressed but no physical exam was performed. If it was felt that the patient should be evaluated in the office, they were directed there. The patient verbally consented to this visit. Patient was unable to complete an audio/visual visit due to Technical difficulties.  Video visit link did not work when sent to patient.  Location of the patient: home Location of the provider: work Those involved with this call:  Provider: Noemi Chapel, DNP, FNP-C CMA: n/a Front  Desk/Registration: Santina Evans  Time spent on call:  12 minutes on the phone discussing health concerns. 18 minutes total spent in review of patient's record and preparation of their chart. I verified patient identity using two factors (patient name and date of birth). Patient consents verbally to being seen via telemedicine visit today.

## 2021-05-09 ENCOUNTER — Encounter: Payer: Self-pay | Admitting: Nurse Practitioner

## 2021-05-09 DIAGNOSIS — B9689 Other specified bacterial agents as the cause of diseases classified elsewhere: Secondary | ICD-10-CM

## 2021-05-09 DIAGNOSIS — J019 Acute sinusitis, unspecified: Secondary | ICD-10-CM

## 2021-05-09 MED ORDER — AMOXICILLIN-POT CLAVULANATE 875-125 MG PO TABS: 1.0000 | ORAL_TABLET | Freq: Two times a day (BID) | ORAL | 0 refills | Status: AC

## 2021-05-20 ENCOUNTER — Other Ambulatory Visit: Payer: Self-pay

## 2021-05-20 ENCOUNTER — Encounter: Payer: Self-pay | Admitting: Nurse Practitioner

## 2021-05-20 ENCOUNTER — Ambulatory Visit: Payer: Commercial Managed Care - PPO | Admitting: Nurse Practitioner

## 2021-05-20 VITALS — BP 122/80 | HR 83 | Ht 66.0 in | Wt 199.0 lb

## 2021-05-20 DIAGNOSIS — K5010 Crohn's disease of large intestine without complications: Secondary | ICD-10-CM

## 2021-05-20 DIAGNOSIS — F411 Generalized anxiety disorder: Secondary | ICD-10-CM | POA: Diagnosis not present

## 2021-05-20 DIAGNOSIS — R197 Diarrhea, unspecified: Secondary | ICD-10-CM

## 2021-05-20 DIAGNOSIS — E1140 Type 2 diabetes mellitus with diabetic neuropathy, unspecified: Secondary | ICD-10-CM

## 2021-05-20 DIAGNOSIS — H6591 Unspecified nonsuppurative otitis media, right ear: Secondary | ICD-10-CM

## 2021-05-20 DIAGNOSIS — E1169 Type 2 diabetes mellitus with other specified complication: Secondary | ICD-10-CM

## 2021-05-20 DIAGNOSIS — E785 Hyperlipidemia, unspecified: Secondary | ICD-10-CM

## 2021-05-20 MED ORDER — ALPRAZOLAM 0.5 MG PO TABS: 0.5000 mg | ORAL_TABLET | Freq: Every evening | ORAL | 0 refills | Status: DC | PRN

## 2021-05-20 MED ORDER — METFORMIN HCL 1000 MG PO TABS: 1000.0000 mg | ORAL_TABLET | Freq: Two times a day (BID) | ORAL | 1 refills | Status: DC

## 2021-05-20 MED ORDER — ATORVASTATIN CALCIUM 40 MG PO TABS: 40.0000 mg | ORAL_TABLET | Freq: Every day | ORAL | 1 refills | Status: DC

## 2021-05-20 MED ORDER — SERTRALINE HCL 100 MG PO TABS: 100.0000 mg | ORAL_TABLET | Freq: Every day | ORAL | 1 refills | Status: DC

## 2021-05-20 MED ORDER — OZEMPIC (0.25 OR 0.5 MG/DOSE) 2 MG/1.5ML ~~LOC~~ SOPN: 0.5000 mg | PEN_INJECTOR | SUBCUTANEOUS | 1 refills | Status: DC

## 2021-05-20 NOTE — Progress Notes (Signed)
Subjective:    Patient ID: Kimberly Solis, female    DOB: [birth year], 61 y.o.   MRN: [id number]  HPI: Kimberly Solis is a 61 y.o. female presenting for follow up.  Chief Complaint  Patient presents with   Diabetes   DIABETES Currently taking metformin 1000 mg twice daily with meals and Ozempic 0.5 mg weekly.  She is taking atorvastatin 40 mg daily for cholesterol.  Last A1c in September 2022 was 6.7%.  Last LDL cholesterol in May 2022 was 105.  Recommended increasing atorvastatin to 80 mg at that time. Hypoglycemic episodes:no Polydipsia/polyuria: no Visual disturbance: no Chest pain: no Paresthesias: no Glucose Monitoring: yes  Accucheck frequency: daily in the morning  Fasting glucose: 135-155 Taking Insulin?: no Blood Pressure Monitoring: not checking Retinal Examination: Up to Date; requested records today Foot Exam: done today Diabetic Education: Completed Pneumovax:  Declines for now Influenza: Up to Date Aspirin: no Statin: Atorvastatin 40 mg daily A1c goal: Less than 7%  ANXIETY/STRESS Currently taking sertraline 100 mg, alprazolam 0.5 mg at bedtime as needed to help with sleep.  She has been more stressed lately with husband being out of work and trying to make ends meet with bills.  Feels the sertraline 100 mg is working for her day-to-day anxiety.  Depression screen [facility] 2/9 05/20/2021 09/28/2019 01/25/2019 09/24/2018 05/04/2018  Decreased Interest 0 0 0 0 0  Down, Depressed, Hopeless 0 0 0 0 0  PHQ - 2 Score 0 0 0 0 0  Altered sleeping 2 2 - 0 -  Tired, decreased energy 2 2 - 0 -  Change in appetite 0 0 - 0 -  Feeling bad or failure about yourself  0 0 - 0 -  Trouble concentrating 0 0 - 0 -  Moving slowly or fidgety/restless 0 0 - 0 -  Suicidal thoughts 0 0 - 0 -  PHQ-9 Score 4 4 - 0 -  Difficult doing work/chores Not difficult at all Not difficult at all - Not difficult at all -    GAD 7 : Generalized Anxiety Score 05/20/2021 09/24/2018  Nervous,  Anxious, on Edge 1 0  Control/stop worrying 2 0  Worry too much - different things 2 0  Trouble relaxing 1 0  Restless 0 0  Easily annoyed or irritable 1 0  Afraid - awful might happen 0 0  Total GAD 7 Score 7 0  Anxiety Difficulty Not difficult at all Not difficult at all    Reports she is feeling better from the upper respiratory infection.  She is still having some head and right ear pressure.  She would like to be checked for C Diff today, has been having diarrhea and abdominal pain since antibiotic.  No blood in her stool.  Reports bowel movement 8-10 times per day without Immodium.  Imodium does help.  Follows closely with Dr. Collene Mares for Crohn's disease.  Next appointment is early February.  Has been missing some recent doses of Humira related to recent sebaceous cyst excision, antibiotic usage.  Allergies  Allergen Reactions   Doxycycline Shortness Of Breath   Azathioprine Nausea And Vomiting   Mesalamine Er     SOB, Dizziness    Outpatient Encounter Medications as of 05/20/2021  Medication Sig   Adalimumab (HUMIRA) 40 MG/0.8ML PSKT Inject into the skin. QOW   BIOTIN PO Take by mouth.   Blood Glucose Monitoring Suppl (BLOOD GLUCOSE SYSTEM PAK) KIT Please dispense based on patient and insurance preference. Use as  directed to monitor FSBS 2x daily. Dx: E11.9   Coenzyme Q10 (CO Q-10 PO) Take by mouth.   Estradiol (MINIVELLE TD) Minivelle   ferrous sulfate 325 (65 FE) MG tablet Take 325 mg by mouth daily with breakfast.   Multiple Vitamin (MULTIVITAMIN) tablet Take 1 tablet by mouth daily. [facility] Women's +50   Multiple Vitamins-Minerals (WOMENS MULTIVITAMIN PO) Take by mouth.   OneTouch Delica Lancets 05X MISC USE AS DIRECTED TO MONITOR FSBS 2X DAILY   ONETOUCH ULTRA test strip USE AS DIRECTED TO MONITOR FSBS TWICE A DAY   PROGESTERONE PO Progesterone   sodium chloride (OCEAN) 0.65 % SOLN nasal spray Place 1 spray into both nostrils as needed for congestion.   [DISCONTINUED]  ALPRAZolam (XANAX) 0.5 MG tablet Take 1 tablet (0.5 mg total) by mouth at bedtime as needed for sleep.   [DISCONTINUED] atorvastatin (LIPITOR) 40 MG tablet Take 1 tablet (40 mg total) by mouth daily.   [DISCONTINUED] benzonatate (TESSALON) 200 MG capsule Take 1 capsule (200 mg total) by mouth 2 (two) times daily as needed for cough.   [DISCONTINUED] erythromycin ophthalmic ointment Place 1 application into both eyes at bedtime.   [DISCONTINUED] metFORMIN (GLUCOPHAGE) 1000 MG tablet Take 1 tablet (1,000 mg total) by mouth 2 (two) times daily.   [DISCONTINUED] Semaglutide,0.25 or 0.5MG/DOS, (OZEMPIC, 0.25 OR 0.5 MG/DOSE,) 2 MG/1.5ML SOPN Inject 0.25 mg into the skin once a week.   [DISCONTINUED] sertraline (ZOLOFT) 100 MG tablet Take 1 tablet (100 mg total) by mouth daily.   ALPRAZolam (XANAX) 0.5 MG tablet Take 1 tablet (0.5 mg total) by mouth at bedtime as needed for sleep.   atorvastatin (LIPITOR) 40 MG tablet Take 1 tablet (40 mg total) by mouth daily.   metFORMIN (GLUCOPHAGE) 1000 MG tablet Take 1 tablet (1,000 mg total) by mouth 2 (two) times daily.   Semaglutide,0.25 or 0.5MG/DOS, (OZEMPIC, 0.25 OR 0.5 MG/DOSE,) 2 MG/1.5ML SOPN Inject 0.5 mg into the skin once a week.   sertraline (ZOLOFT) 100 MG tablet Take 1 tablet (100 mg total) by mouth daily.   No facility-administered encounter medications on file as of 05/20/2021.    Patient Active Problem List   Diagnosis Date Noted   Type 2 diabetes mellitus with diabetic neuropathy, without long-term current use of insulin ([place]) 05/20/2021   PND (post-nasal drip) 09/17/2020   Normocytic anemia due to blood loss 09/17/2020   Controlled substance agreement signed 09/17/2020   Glaucoma (increased eye pressure) 06/01/2020   Post menopausal syndrome 05/30/2019   Obesity (BMI 30-39.9) 01/25/2019   Diabetic neuropathy ([place]) 03/15/2015   Hyperlipidemia associated with type 2 diabetes mellitus ([place]) 03/15/2015   Hyperlipidemia    GAD (generalized  anxiety disorder)    Crohn's colitis ([place])     Past Medical History:  Diagnosis Date   Anxiety    Diabetes mellitus without complication ([place])    Hyperlipidemia    Hypertension    Type II diabetes mellitus with neurological manifestations ([place]) 03/15/2015   Ulcerative colitis ([place]) 05/05/2005    Relevant past medical, surgical, family and social history reviewed and updated as indicated. Interim medical history since our last visit reviewed.  Review of Systems Per HPI unless specifically indicated above     Objective:    BP 122/80    Pulse 83    Ht 5' 6"  (1.676 m)    Wt 199 lb (90.3 kg)    LMP  (LMP Unknown)    SpO2 97%    BMI 32.12 kg/m  Wt Readings from Last 3 Encounters:  05/20/21 199 lb (90.3 kg)  01/18/21 202 lb 9.6 oz (91.9 kg)  11/19/20 202 lb (91.6 kg)    Physical Exam Vitals and nursing note reviewed.  Constitutional:      General: She is not in acute distress.    Appearance: Normal appearance. She is obese. She is not toxic-appearing.  HENT:     Head: Normocephalic and atraumatic.     Right Ear: Ear canal and external ear normal. A middle ear effusion is present. There is no impacted cerumen. Tympanic membrane is not injected, scarred, perforated, erythematous, retracted or bulging.     Left Ear: Tympanic membrane, ear canal and external ear normal.  Eyes:     General: No scleral icterus.    Extraocular Movements: Extraocular movements intact.  Neck:     Vascular: No carotid bruit.  Cardiovascular:     Rate and Rhythm: Normal rate and regular rhythm.     Heart sounds: No murmur heard. Pulmonary:     Effort: Pulmonary effort is normal. No respiratory distress.     Breath sounds: Normal breath sounds. No wheezing, rhonchi or rales.  Abdominal:     General: Abdomen is flat. Bowel sounds are normal. There is no distension.     Palpations: Abdomen is soft. There is no mass.     Tenderness: There is no abdominal tenderness. There is no right CVA tenderness, left  CVA tenderness or guarding.  Musculoskeletal:        General: No swelling or tenderness. Normal range of motion.     Cervical back: Normal range of motion.     Right lower leg: No edema.     Left lower leg: No edema.  Lymphadenopathy:     Cervical: No cervical adenopathy.  Skin:    General: Skin is warm and dry.     Capillary Refill: Capillary refill takes less than 2 seconds.     Coloration: Skin is not jaundiced.     Findings: No bruising.  Neurological:     Mental Status: She is alert and oriented to person, place, and time.     Motor: No weakness.     Gait: Gait normal.  Psychiatric:        Mood and Affect: Mood normal.        Behavior: Behavior normal.        Thought Content: Thought content normal.        Judgment: Judgment normal.       Assessment & Plan:   Problem List Items Addressed This Visit       Digestive   Crohn's colitis ([place])    Chronic.  Continue collaboration.  Records requested today from Dr. Collene Mares for most recent colonoscopy.        Endocrine   Type 2 diabetes mellitus with diabetic neuropathy, without long-term current use of insulin (H[facility]) - Primary    Chronic.  A1c goal less than 7%.  Continue metformin 1000 mg twice daily and Ozempic 0.5 mg weekly.  Plan to increase Ozempic to 1 mg weekly if A1c not at goal.  Urine microalbumin is up-to-date.  Foot exam today normal.  Requested eye exam today.  LDL goal less than 70-she is on a statin.  She has had flu shot for the season, declines pneumonia and COVID vaccines for now.  Follow-up with new PCP.      Relevant Medications   Semaglutide,0.25 or 0.5MG/DOS, (OZEMPIC, 0.25 OR 0.5 MG/DOSE,) 2 MG/1.5ML SOPN  metFORMIN (GLUCOPHAGE) 1000 MG tablet   atorvastatin (LIPITOR) 40 MG tablet   Other Relevant Orders   Lipid Panel   Hemoglobin N3Z   BASIC METABOLIC PANEL WITH GFR   Hyperlipidemia associated with type 2 diabetes mellitus (HCC)    Chronic.  LDL goal less than 70.  She is tolerating atorvastatin 40  mg daily well.  Check lipids today-patient is fasting.  If LDL above goal, recommend increasing atorvastatin to 80 mg daily.  Follow-up with new PCP.      Relevant Medications   Semaglutide,0.25 or 0.5MG/DOS, (OZEMPIC, 0.25 OR 0.5 MG/DOSE,) 2 MG/1.5ML SOPN   metFORMIN (GLUCOPHAGE) 1000 MG tablet   atorvastatin (LIPITOR) 40 MG tablet   Other Relevant Orders   Lipid Panel     Other   GAD (generalized anxiety disorder)    Chronic.  PHQ-9 and GAD-7 are stable today.  Plan to continue sertraline 100 mg daily.  Uses alprazolam 0.5 mg daily as needed to help with sleep.  She has been taking this a little bit more frequently related to stress, again we discussed this at length today.  She is not to take this daily as this can be habit-forming.  PDMP reviewed and appropriate-refill given.  Follow-up with new PCP.      Relevant Medications   sertraline (ZOLOFT) 100 MG tablet   ALPRAZolam (XANAX) 0.5 MG tablet   Other Visit Diagnoses     Diarrhea, unspecified type       We will check C. difficile given use of Humira and recent antibiotic use associated with more frequent diarrhea and abdominal pain.   Relevant Orders   C. difficile GDH and Toxin A/B   OME (otitis media with effusion), right       Discussed use of nasal corticosteroid like Flonase to help with eustachian tube dysfunction and right middle ear effusion.  Follow-up with no improvement.        Follow up plan: Return for with new pcp.

## 2021-05-20 NOTE — Assessment & Plan Note (Signed)
Chronic.  PHQ-9 and GAD-7 are stable today.  Plan to continue sertraline 100 mg daily.  Uses alprazolam 0.5 mg daily as needed to help with sleep.  She has been taking this a little bit more frequently related to stress, again we discussed this at length today.  She is not to take this daily as this can be habit-forming.  PDMP reviewed and appropriate-refill given.  Follow-up with new PCP.

## 2021-05-20 NOTE — Assessment & Plan Note (Signed)
Chronic.  LDL goal less than 70.  She is tolerating atorvastatin 40 mg daily well.  Check lipids today-patient is fasting.  If LDL above goal, recommend increasing atorvastatin to 80 mg daily.  Follow-up with new PCP.

## 2021-05-20 NOTE — Assessment & Plan Note (Addendum)
Chronic.  Continue collaboration.  Records requested today from Dr. Collene Mares for most recent colonoscopy.

## 2021-05-20 NOTE — Assessment & Plan Note (Signed)
Chronic.  A1c goal less than 7%.  Continue metformin 1000 mg twice daily and Ozempic 0.5 mg weekly.  Plan to increase Ozempic to 1 mg weekly if A1c not at goal.  Urine microalbumin is up-to-date.  Foot exam today normal.  Requested eye exam today.  LDL goal less than 70-she is on a statin.  She has had flu shot for the season, declines pneumonia and COVID vaccines for now.  Follow-up with new PCP.

## 2021-05-21 ENCOUNTER — Encounter: Payer: Self-pay | Admitting: Nurse Practitioner

## 2021-05-21 LAB — LIPID PANEL
Cholesterol: 171 mg/dL (ref <200)
HDL: 49 mg/dL — ABNORMAL LOW (ref >=50)
LDL Cholesterol (Calc): 98 mg/dL (calc)
Non-HDL Cholesterol (Calc): 122 mg/dL (calc) (ref <130)
Total CHOL/HDL Ratio: 3.5 (calc) (ref <5.0)
Triglycerides: 147 mg/dL (ref <150)

## 2021-05-21 LAB — BASIC METABOLIC PANEL WITH GFR
BUN: 12 mg/dL (ref 7–25)
CO2: 34 mmol/L — ABNORMAL HIGH (ref 20–32)
Calcium: 9.6 mg/dL (ref 8.6–10.4)
Chloride: 102 mmol/L (ref 98–110)
Creat: 0.73 mg/dL (ref 0.50–1.03)
Glucose, Bld: 159 mg/dL — ABNORMAL HIGH (ref 65–99)
Potassium: 4.5 mmol/L (ref 3.5–5.3)
Sodium: 140 mmol/L (ref 135–146)
eGFR: 95 mL/min/{1.73_m2} (ref >=60)

## 2021-05-21 LAB — HEMOGLOBIN A1C
Hgb A1c MFr Bld: 7 % of total Hgb — ABNORMAL HIGH (ref <5.7)
Mean Plasma Glucose: 154 mg/dL
eAG (mmol/L): 8.5 mmol/L

## 2021-05-21 MED ORDER — ATORVASTATIN CALCIUM 80 MG PO TABS: 80.0000 mg | ORAL_TABLET | Freq: Every day | ORAL | 3 refills | Status: DC

## 2021-05-21 MED ORDER — SEMAGLUTIDE (2 MG/DOSE) 8 MG/3ML ~~LOC~~ SOPN: 2.0000 mg | PEN_INJECTOR | SUBCUTANEOUS | 1 refills | Status: DC

## 2021-05-21 MED ORDER — SEMAGLUTIDE (1 MG/DOSE) 4 MG/3ML ~~LOC~~ SOPN: 1.0000 mg | PEN_INJECTOR | SUBCUTANEOUS | 1 refills | Status: DC

## 2021-05-21 NOTE — Addendum Note (Signed)
Addended by: Noemi Chapel A on: 05/21/2021 06:43 AM   Modules accepted: Orders

## 2021-05-22 ENCOUNTER — Other Ambulatory Visit: Payer: Self-pay | Admitting: Nurse Practitioner

## 2021-05-22 LAB — C. DIFFICILE GDH AND TOXIN A/B
GDH ANTIGEN: DETECTED
MICRO NUMBER:: 12875457
SPECIMEN QUALITY:: ADEQUATE
TOXIN A AND B: NOT DETECTED

## 2021-05-22 LAB — CLOSTRIDIUM DIFFICILE TOXIN B, QUALITATIVE, REAL-TIME PCR: Toxigenic C. Difficile by PCR: NOT DETECTED

## 2021-05-24 ENCOUNTER — Other Ambulatory Visit: Payer: Self-pay | Admitting: Nurse Practitioner

## 2021-06-05 ENCOUNTER — Ambulatory Visit (INDEPENDENT_AMBULATORY_CARE_PROVIDER_SITE_OTHER): Payer: Commercial Managed Care - PPO | Admitting: Physician Assistant

## 2021-06-05 ENCOUNTER — Encounter: Payer: Self-pay | Admitting: Physician Assistant

## 2021-06-05 ENCOUNTER — Other Ambulatory Visit: Payer: Self-pay

## 2021-06-05 ENCOUNTER — Ambulatory Visit: Payer: Self-pay

## 2021-06-05 DIAGNOSIS — M79641 Pain in right hand: Secondary | ICD-10-CM | POA: Diagnosis not present

## 2021-06-05 DIAGNOSIS — G8929 Other chronic pain: Secondary | ICD-10-CM

## 2021-06-05 DIAGNOSIS — M25511 Pain in right shoulder: Secondary | ICD-10-CM | POA: Diagnosis not present

## 2021-06-05 MED ORDER — LIDOCAINE HCL 1 % IJ SOLN
3.0000 mL | INTRAMUSCULAR | Status: AC | PRN
  Administered 2021-06-05: 3 mL

## 2021-06-05 MED ORDER — METHYLPREDNISOLONE ACETATE 40 MG/ML IJ SUSP
40.0000 mg | INTRAMUSCULAR | Status: AC | PRN
  Administered 2021-06-05: 40 mg via INTRA_ARTICULAR

## 2021-06-05 NOTE — Progress Notes (Signed)
Office Visit Note   Patient: Kimberly Solis           Date of Birth: [birth year]           MRN: [id number] Visit Date: 06/05/2021              Requested by: Eulogio Bear, NP [address],  [place] [address] PCP: Eulogio Bear, NP   Assessment & Plan: Visit Diagnoses:  1. Pain of right hand   2. Chronic right shoulder pain     Plan:  We will send her to Dr. Tempie Donning for evaluation of the right hand mass.  In regards to her shoulder reviewed wall crawls, pendulum, [place] and forward flexion exercises with her.  She tolerated the injection well if she does not get good relief she will let us know we can always order an MRI of her shoulder to evaluate for cuff tear.  Questions were encouraged and answered at length.  Follow-Up Instructions: Return if symptoms worsen or fail to improve.   Orders:  Orders Placed This Encounter  Procedures   Large Joint Inj   XR Shoulder Right   Ambulatory referral to Orthopedic Surgery   No orders of the defined types were placed in this encounter.     Procedures: Large Joint Inj: R subacromial bursa on 06/05/2021 1:31 PM Indications: pain Details: 22 G 1.5 in needle, superior approach  Arthrogram: No  Medications: 3 mL lidocaine 1 %; 40 mg methylPREDNISolone acetate 40 MG/ML Outcome: tolerated well, no immediate complications Procedure, treatment alternatives, risks and benefits explained, specific risks discussed. Consent was given by the patient. Immediately prior to procedure a time out was called to verify the correct patient, procedure, equipment, support staff and site/side marked as required. Patient was prepped and draped in the usual sterile fashion.      Clinical Data: No additional findings.   Subjective: Chief Complaint  Patient presents with   Right Shoulder - Pain   Right Ring Finger - Pain    HPI Kimberly Solis is similarly seen in the past its been numerous years ago.  She comes in today  with recurrent right shoulder pain.  We have seen her for right shoulder impingement in the past.  She did well with cortisone injections.  She has had no new injury to the right shoulder.  She states the pains been ongoing for the past 6 months.  She is having more pain around the collarbone and with abduction of the arm and extreme external rotation of the right shoulder.  She denies any radicular symptoms down the arm.  She also notes that she has a knot in her right hand that showed up a couple of months ago.  It is about the ring finger and the fifth finger just below the middle carpal heads.  No triggering of any of the fingers.  She is diabetic.  Last hemoglobin A1c was 7.0.  Review of Systems  Constitutional:  Negative for chills and fever.    Objective: Vital Signs: LMP  (LMP Unknown)   Physical Exam Constitutional:      Appearance: She is not ill-appearing or diaphoretic.  Pulmonary:     Effort: Pulmonary effort is normal.  Neurological:     Mental Status: She is alert and oriented to person, place, and time.  Psychiatric:        Mood and Affect: Mood normal.    Ortho Exam Bilateral shoulder she has 5 strength with  external and internal rotation his resistance.  Liftoff test is negative bilaterally.  Empty can test negative bilaterally.  Impingement testing positive on the right.  Right hand no active triggering.  She has thickening consistent with Dupuytren's contractures involving the right hand only over the fourth and fifth metacarpals.  No rash or skin lesions ulcerations underlying his right hand.  Specialty Comments:  No specialty comments available.  Imaging: XR Shoulder Right  Result Date: 06/05/2021 Right shoulder 3 views: Shoulder is well located glenohumeral joint is well-maintained.  Downsloping acromion is present.  No acute fractures or bony abnormalities.  No significant arthritic changes throughout the right shoulder girdle.    PMFS History: Patient Active  Problem List   Diagnosis Date Noted   Type 2 diabetes mellitus with diabetic neuropathy, without long-term current use of insulin ([place]) 05/20/2021   PND (post-nasal drip) 09/17/2020   Normocytic anemia due to blood loss 09/17/2020   Controlled substance agreement signed 09/17/2020   Glaucoma (increased eye pressure) 06/01/2020   Post menopausal syndrome 05/30/2019   Obesity (BMI 30-39.9) 01/25/2019   Diabetic neuropathy ([place]) 03/15/2015   Hyperlipidemia associated with type 2 diabetes mellitus ([place]) 03/15/2015   Hyperlipidemia    GAD (generalized anxiety disorder)    Crohn's colitis ([place])    Past Medical History:  Diagnosis Date   Anxiety    Diabetes mellitus without complication ([place])    Hyperlipidemia    Hypertension    Type II diabetes mellitus with neurological manifestations ([place]) 03/15/2015   Ulcerative colitis ([place]) 05/05/2005    Family History  Problem Relation Age of Onset   Breast cancer Sister     History reviewed. No pertinent surgical history. Social History   Occupational History   Not on file  Tobacco Use   Smoking status: Former    Packs/day: 0.75    Years: 10.00    Pack years: 7.50    Types: Cigarettes    Quit date: 04/20/1999    Years since quitting: 22.1   Smokeless tobacco: Never  Substance and Sexual Activity   Alcohol use: No   Drug use: No   Sexual activity: Not on file

## 2021-06-10 ENCOUNTER — Other Ambulatory Visit: Payer: Self-pay

## 2021-06-10 ENCOUNTER — Other Ambulatory Visit: Payer: Self-pay | Admitting: Nurse Practitioner

## 2021-07-01 ENCOUNTER — Ambulatory Visit
Admission: RE | Admit: 2021-07-01 | Discharge: 2021-07-01 | Disposition: A | Discharge status: Home / Self Care | Payer: Commercial Managed Care - PPO | Source: Ambulatory Visit | Attending: Obstetrics and Gynecology | Admitting: Obstetrics and Gynecology

## 2021-07-01 ENCOUNTER — Other Ambulatory Visit: Payer: Self-pay

## 2021-07-01 DIAGNOSIS — R928 Other abnormal and inconclusive findings on diagnostic imaging of breast: Secondary | ICD-10-CM

## 2021-07-01 LAB — HM MAMMOGRAPHY

## 2021-07-01 LAB — HM DEXA SCAN: HM Dexa Scan: NORMAL

## 2021-08-21 LAB — HM PAP SMEAR: HM Pap smear: NORMAL

## 2021-09-10 ENCOUNTER — Ambulatory Visit: Payer: Commercial Managed Care - PPO | Admitting: Family

## 2021-09-10 ENCOUNTER — Encounter: Payer: Self-pay | Admitting: Family

## 2021-09-10 VITALS — BP 122/80 | HR 93 | Temp 98.6°F | Resp 16 | Ht 66.0 in | Wt 202.2 lb

## 2021-09-10 DIAGNOSIS — K5010 Crohn's disease of large intestine without complications: Secondary | ICD-10-CM

## 2021-09-10 DIAGNOSIS — R5382 Chronic fatigue, unspecified: Secondary | ICD-10-CM | POA: Diagnosis not present

## 2021-09-10 DIAGNOSIS — R922 Inconclusive mammogram: Secondary | ICD-10-CM

## 2021-09-10 DIAGNOSIS — R9230 Dense breasts, unspecified: Secondary | ICD-10-CM | POA: Insufficient documentation

## 2021-09-10 DIAGNOSIS — E785 Hyperlipidemia, unspecified: Secondary | ICD-10-CM | POA: Diagnosis not present

## 2021-09-10 DIAGNOSIS — E1169 Type 2 diabetes mellitus with other specified complication: Secondary | ICD-10-CM | POA: Diagnosis not present

## 2021-09-10 DIAGNOSIS — E1140 Type 2 diabetes mellitus with diabetic neuropathy, unspecified: Secondary | ICD-10-CM

## 2021-09-10 DIAGNOSIS — N951 Menopausal and female climacteric states: Secondary | ICD-10-CM

## 2021-09-10 DIAGNOSIS — E1141 Type 2 diabetes mellitus with diabetic mononeuropathy: Secondary | ICD-10-CM

## 2021-09-10 DIAGNOSIS — D500 Iron deficiency anemia secondary to blood loss (chronic): Secondary | ICD-10-CM

## 2021-09-10 DIAGNOSIS — F411 Generalized anxiety disorder: Secondary | ICD-10-CM | POA: Diagnosis not present

## 2021-09-10 DIAGNOSIS — H409 Unspecified glaucoma: Secondary | ICD-10-CM

## 2021-09-10 DIAGNOSIS — E669 Obesity, unspecified: Secondary | ICD-10-CM

## 2021-09-10 LAB — COMPREHENSIVE METABOLIC PANEL
ALT: 15 U/L (ref 0–35)
AST: 18 U/L (ref 0–37)
Albumin: 4.7 g/dL (ref 3.5–5.2)
Alkaline Phosphatase: 80 U/L (ref 39–117)
BUN: 14 mg/dL (ref 6–23)
CO2: 29 mEq/L (ref 19–32)
Calcium: 10 mg/dL (ref 8.4–10.5)
Chloride: 101 mEq/L (ref 96–112)
Creatinine, Ser: 0.83 mg/dL (ref 0.40–1.20)
GFR: 76.8 mL/min (ref >60.00)
Glucose, Bld: 130 mg/dL — ABNORMAL HIGH (ref 70–99)
Potassium: 5 mEq/L (ref 3.5–5.1)
Sodium: 140 mEq/L (ref 135–145)
Total Bilirubin: 0.5 mg/dL (ref 0.2–1.2)
Total Protein: 7.7 g/dL (ref 6.0–8.3)

## 2021-09-10 LAB — CBC WITH DIFFERENTIAL/PLATELET
Basophils Absolute: 0.1 10*3/uL (ref 0.0–0.1)
Basophils Relative: 0.8 % (ref 0.0–3.0)
Eosinophils Absolute: 0.1 10*3/uL (ref 0.0–0.7)
Eosinophils Relative: 1.4 % (ref 0.0–5.0)
HCT: 41.6 % (ref 36.0–46.0)
Hemoglobin: 13.7 g/dL (ref 12.0–15.0)
Lymphocytes Relative: 25.8 % (ref 12.0–46.0)
Lymphs Abs: 2 10*3/uL (ref 0.7–4.0)
MCHC: 32.9 g/dL (ref 30.0–36.0)
MCV: 88.9 fl (ref 78.0–100.0)
Monocytes Absolute: 0.8 10*3/uL (ref 0.1–1.0)
Monocytes Relative: 10.3 % (ref 3.0–12.0)
Neutro Abs: 4.9 10*3/uL (ref 1.4–7.7)
Neutrophils Relative %: 61.7 % (ref 43.0–77.0)
Platelets: 388 10*3/uL (ref 150.0–400.0)
RBC: 4.68 Mil/uL (ref 3.87–5.11)
RDW: 14 % (ref 11.5–15.5)
WBC: 7.9 10*3/uL (ref 4.0–10.5)

## 2021-09-10 LAB — B12 AND FOLATE PANEL
Folate: >23.5 ng/mL (ref >5.9)
Vitamin B-12: 758 pg/mL (ref 211–911)

## 2021-09-10 LAB — LIPID PANEL
Cholesterol: 201 mg/dL — ABNORMAL HIGH (ref 0–200)
HDL: 57.3 mg/dL (ref >39.00)
LDL Cholesterol: 109 mg/dL — ABNORMAL HIGH (ref 0–99)
NonHDL: 143.57
Total CHOL/HDL Ratio: 4
Triglycerides: 172 mg/dL — ABNORMAL HIGH (ref 0.0–149.0)
VLDL: 34.4 mg/dL (ref 0.0–40.0)

## 2021-09-10 LAB — HEMOGLOBIN A1C: Hgb A1c MFr Bld: 6.6 % — ABNORMAL HIGH (ref 4.6–6.5)

## 2021-09-10 LAB — TSH: TSH: 1.19 u[IU]/mL (ref 0.35–5.50)

## 2021-09-10 LAB — MICROALBUMIN / CREATININE URINE RATIO
Creatinine,U: 211.9 mg/dL
Microalb Creat Ratio: 1 mg/g (ref 0.0–30.0)
Microalb, Ur: 2.1 mg/dL — ABNORMAL HIGH (ref 0.0–1.9)

## 2021-09-10 MED ORDER — ATORVASTATIN CALCIUM 40 MG PO TABS: 40.0000 mg | ORAL_TABLET | Freq: Every day | ORAL | 3 refills | Status: DC

## 2021-09-10 MED ORDER — METFORMIN HCL ER 500 MG PO TB24: 500.0000 mg | ORAL_TABLET | Freq: Every day | ORAL | 1 refills | Status: DC

## 2021-09-10 MED ORDER — SEMAGLUTIDE (1 MG/DOSE) 4 MG/3ML ~~LOC~~ SOPN: 1.0000 mg | PEN_INJECTOR | SUBCUTANEOUS | 1 refills | Status: DC

## 2021-09-10 NOTE — Assessment & Plan Note (Signed)
Work on diet and exercise as tolerated  ?

## 2021-09-10 NOTE — Assessment & Plan Note (Signed)
Continue sertraline 100 mg once daily  ?Work on anxiety reducing techniques ? ?

## 2021-09-10 NOTE — Progress Notes (Signed)
? ?Established Patient Office Visit ? ?Subjective:  ?Patient ID: Kimberly Solis, female    DOB: 1960-11-01  Age: 61 y.o. MRN: [id number] ? ?CC:  ?Chief Complaint  ?Patient presents with  ? Establish Care  ? ? ?HPI ?Kimberly Solis is here for a transition of care visit. ? ?Does c/o fatigue, chronic. Often tired.  ? ?Prior provider was: Noemi Chapel, NP  ?Pt is without acute concerns.  ? ?Mammogram: 2/23, asymmetry stable, does have dense breasts. Repeat dx mammogram in one year  ?Bone density: 2022 normal no osteopenia ?Colonoscopy 2020- repeat in five years.  ? ?chronic concerns: ? ?Chrohns: at least once a month with flare up. Followed by Gi Dr. Twice yearly or as needed. Watches her diet which helps to control symptoms.  ? ?HLD: on atorvastatin 80 mg, can't do 80 mg too many muscle pains. Also takes COQ10. Is fine on 40 mg.  ?Lab Results  ?Component Value Date  ? CHOL 171 05/20/2021  ? HDL 49 (L) 05/20/2021  ? [place] 98 05/20/2021  ? TRIG 147 05/20/2021  ? CHOLHDL 3.5 05/20/2021  ? ?IDA: taking daily ferrous sulfate 325 mg  ? ?DM2: only taking metformin 500 mg twice daily. She does notice that when she takes 1000 mg twice daily her chrohns is much worse. Goes to opthamologist once yearly for this as well as glaucoma.  ?Lab Results  ?Component Value Date  ? HGBA1C 7.0 (H) 05/20/2021  ? ?HRT: seeing GYN. Taking progesterone 100 mg capsule as well as estradiol 0.05 mg patch ? ?GAD: taking sertraline 100 mg once daily Doing well on this.  ? ?  05/20/2021  ?  8:39 AM 09/24/2018  ?  9:33 AM  ?GAD 7 : Generalized Anxiety Score  ?Nervous, Anxious, on Edge 1 0  ?Control/stop worrying 2 0  ?Worry too much - different things 2 0  ?Trouble relaxing 1 0  ?Restless 0 0  ?Easily annoyed or irritable 1 0  ?Afraid - awful might happen 0 0  ?Total GAD 7 Score 7 0  ?Anxiety Difficulty Not difficult at all Not difficult at all  ? ? ?Diabetic neuropathy, worse right little toe.  ? ?Past Medical History:  ?Diagnosis Date  ?  Anxiety   ? Diabetes mellitus without complication ([place])   ? Hyperlipidemia   ? Hypertension   ? Type II diabetes mellitus with neurological manifestations ([place]) 03/15/2015  ? Ulcerative colitis ([place]) 05/05/2005  ? ? ?Past Surgical History:  ?Procedure Laterality Date  ? CERVICAL BIOPSY    ? May 2022  ? DILATION AND CURETTAGE OF UTERUS    ? ENDOMETRIAL BIOPSY    ? 05/2020  ? ? ?Family History  ?Problem Relation Age of Onset  ? Irritable bowel syndrome Mother   ? Parkinson's disease Mother   ? Dementia Mother   ? Hypertension Father   ? Heart disease Father   ? Diabetes Mellitus II Father   ? Breast cancer Sister 43  ? Parkinson's disease Maternal Grandfather   ? Heart disease Maternal Grandfather   ? Diabetes Mellitus II Maternal Grandfather   ? ? ?Social History  ? ?Socioeconomic History  ? Marital status: Married  ?  Spouse name: Not on file  ? Number of children: Not on file  ? Years of education: Not on file  ? Highest education level: Not on file  ?Occupational History  ? Not on file  ?Tobacco Use  ? Smoking status: Former  ?  Packs/day: 0.75  ?  Years: 10.00  ?  Pack years: 7.50  ?  Types: Cigarettes  ?  Quit date: 04/20/1999  ?  Years since quitting: 22.4  ? Smokeless tobacco: Never  ?Substance and Sexual Activity  ? Alcohol use: No  ? Drug use: No  ? Sexual activity: Not on file  ?Other Topics Concern  ? Not on file  ?Social History Narrative  ? 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  ? She is married.  ? Her husband has lupus.  ? Her daughter was recently diagnosed with lupus.  ? Her mother has Parkinson's disease. Patient is involved with helping care for her.  ? ?Social Determinants of Health  ? ?Financial Resource Strain: Not on file  ?Food Insecurity: Not on file  ?Transportation Needs: Not on file  ?Physical Activity: Not on file  ?Stress: Not on file  ?Social Connections: Not on file  ?Intimate Partner Violence: Not on file  ? ? ?Outpatient Medications Prior to Visit   ?Medication Sig Dispense Refill  ? Adalimumab (HUMIRA) 40 MG/0.8ML PSKT Inject into the skin. QOW    ? ALPRAZolam (XANAX) 0.5 MG tablet Take 1 tablet (0.5 mg total) by mouth at bedtime as needed for sleep. 60 tablet 0  ? Barberry-Oreg Grape-Goldenseal (BERBERINE COMPLEX) 200-200-50 MG CAPS Take by mouth.    ? Blood Glucose Monitoring Suppl (BLOOD GLUCOSE SYSTEM PAK) KIT Please dispense based on patient and insurance preference. Use as directed to monitor FSBS 2x daily. Dx: E11.9 1 kit 1  ? Coenzyme Q10 (CO Q-10 PO) Take by mouth.    ? estradiol (VIVELLE-DOT) 0.05 MG/24HR patch 1 patch 2 (two) times a week.    ? magnesium citrate SOLN Take 1 Bottle by mouth once.    ? OneTouch Delica Lancets 54Y MISC USE AS DIRECTED TO MONITOR FSBS 2X DAILY 100 each 1  ? ONETOUCH ULTRA test strip USE AS DIRECTED TO MONITOR FSBS TWICE A DAY 100 strip 1  ? progesterone (PROMETRIUM) 100 MG capsule Take 100 mg by mouth at bedtime.    ? sertraline (ZOLOFT) 100 MG tablet Take 1 tablet (100 mg total) by mouth daily. 90 tablet 1  ? atorvastatin (LIPITOR) 80 MG tablet Take 1 tablet (80 mg total) by mouth daily. 90 tablet 3  ? metFORMIN (GLUCOPHAGE) 1000 MG tablet Take 1 tablet (1,000 mg total) by mouth 2 (two) times daily. 180 tablet 1  ? Multiple Vitamins-Minerals (WOMENS MULTIVITAMIN PO) Take by mouth.    ? Semaglutide, 1 MG/DOSE, 4 MG/3ML SOPN Inject 1 mg as directed once a week. 9 mL 1  ? ferrous sulfate 325 (65 FE) MG tablet Take 325 mg by mouth daily with breakfast.    ? Multiple Vitamin (MULTIVITAMIN) tablet Take 1 tablet by mouth daily. [facility] Women's +50    ? BIOTIN PO Take by mouth.    ? Estradiol (MINIVELLE TD) [place]    ? PROGESTERONE PO Progesterone    ? Semaglutide, 2 MG/DOSE, 8 MG/3ML SOPN Inject 2 mg as directed once a week. 3 mL 1  ? sodium chloride (OCEAN) 0.65 % SOLN nasal spray Place 1 spray into both nostrils as needed for congestion. 88 mL 0  ? ?No facility-administered medications prior to visit.  ? ? ?Allergies   ?Allergen Reactions  ? Doxycycline Shortness Of Breath  ? Mesalamine Er Shortness Of Breath  ?  And dizziness  ? Azathioprine Nausea And Vomiting  ? ? ?ROS ?Review of Systems  ?Constitutional:  Positive for fatigue.  Negative for chills, fever and unexpected weight change.  ?Respiratory:  Negative for cough and shortness of breath.   ?Cardiovascular:  Negative for chest pain and leg swelling.  ?Gastrointestinal:  Positive for diarrhea (on and off, about 1-2 times a month. watches food). Negative for nausea.  ?Genitourinary:  Negative for difficulty urinating, menstrual problem and vaginal bleeding.  ?Neurological:  Positive for numbness (tingling at times bil lower extremities, mainly right fifth toe). Negative for dizziness and headaches.  ?Psychiatric/Behavioral:  Negative for agitation and sleep disturbance.   ?All other systems reviewed and are negative. ? ? ?  ?Objective:  ?  ?Physical Exam ?Vitals reviewed.  ?Constitutional:   ?   General: She is not in acute distress. ?   Appearance: Normal appearance. She is obese. She is not ill-appearing, toxic-appearing or diaphoretic.  ?HENT:  ?   Right Ear: Tympanic membrane normal.  ?   Left Ear: Tympanic membrane normal.  ?   Mouth/Throat:  ?   Mouth: Mucous membranes are moist.  ?   Pharynx: No pharyngeal swelling.  ?   Tonsils: No tonsillar exudate.  ?Eyes:  ?   Extraocular Movements: Extraocular movements intact.  ?   Conjunctiva/sclera: Conjunctivae normal.  ?   Pupils: Pupils are equal, round, and reactive to light.  ?Neck:  ?   Thyroid: No thyroid mass.  ?Cardiovascular:  ?   Rate and Rhythm: Normal rate and regular rhythm.  ?Pulmonary:  ?   Effort: Pulmonary effort is normal.  ?   Breath sounds: Normal breath sounds.  ?Abdominal:  ?   General: Abdomen is flat. Bowel sounds are normal.  ?   Palpations: Abdomen is soft.  ?Musculoskeletal:     ?   General: Normal range of motion.  ?Lymphadenopathy:  ?   Cervical:  ?   Right cervical: No superficial cervical  adenopathy. ?   Left cervical: No superficial cervical adenopathy.  ?Skin: ?   General: Skin is warm.  ?   Capillary Refill: Capillary refill takes less than 2 seconds.  ?Neurological:  ?   General: No focal deficit prese

## 2021-09-10 NOTE — Assessment & Plan Note (Signed)
Will assess B12 ?Advised on wearing wider shoes  ? ?

## 2021-09-10 NOTE — Assessment & Plan Note (Signed)
Reviewed mammogram. ?Dx mammo next year, annual.  ?

## 2021-09-10 NOTE — Assessment & Plan Note (Signed)
Continue f/u with GYN as schedule ?Weight pros/cons with Gyn in regards to HRT as pt sister with breast cancer history, per pt has h/o AUB as well as abn HPV pap history ?

## 2021-09-10 NOTE — Assessment & Plan Note (Signed)
Continue humira ?Continue f/u with GI as scheduled ?Avoid trigger foods as able.  ? ?

## 2021-09-10 NOTE — Assessment & Plan Note (Signed)
Ordered hga1c and urine microalbumin today pending results. Work on diabetic diet and exercise as tolerated. Yearly foot exam, and annual eye exam.  ?Refill sent for ozempic 1 mg [place] qweek ?Decrease to metformin 500 mg bid, as well as changing to XR to tolerate  ? ?

## 2021-09-10 NOTE — Assessment & Plan Note (Signed)
Ordered lipid panel, pending results. Work on low cholesterol diet and exercise as tolerated ?Decrease atorvastatin 40 mg once daily to stop myalgias, as tolerates 40 mg without myalgias ?Pending lipid to see if another agent needed ?Continue coq10 ?

## 2021-09-10 NOTE — Patient Instructions (Signed)
Welcome to our clinic, I am happy to have you as my new patient. I am excited to continue on this healthcare journey with you. ? ?Stop by the lab prior to leaving today. I will notify you of your results once received.  ? ?Please keep in mind ?Any my chart messages you send have p to a three business day turnaround for a response.  ?Phone calls may have up to a one day business turnaround for a  response.  ? ?If you need a medication refill I recommend you request it through the pharmacy as this is easiest for [place] rather than sending a message and or phone call.  ? ?Due to recent changes in healthcare laws, you may see results of your imaging and/or laboratory studies on MyChart before I have had a chance to review them.  I understand that in some cases there may be results that are confusing or concerning to you. Please understand that not all results are received at the same time and often I may need to interpret multiple results in order to provide you with the best plan of care or course of treatment. Therefore, I ask that you please give me 2 business days to thoroughly review all your results before contacting my office for clarification. Should we see a critical lab result, you will be contacted sooner.  ? ?It was a pleasure seeing you today! Please do not hesitate to reach out with any questions and or concerns. ? ?Regards,  ? ?Reginold Beale ?FNP-C ? ?

## 2021-09-10 NOTE — Assessment & Plan Note (Signed)
Workup for labs to include tsh, cbc, cmp and b12 folate ?Pending results ?

## 2021-09-10 NOTE — Assessment & Plan Note (Signed)
Continue f/u with opthamologist as scheduled ?Pressure stable today ?

## 2021-09-10 NOTE — Assessment & Plan Note (Signed)
Order cbc  ?Pending results ?Continue ferrous sulfate 325 mg otc ?

## 2021-09-11 ENCOUNTER — Other Ambulatory Visit: Payer: Self-pay | Admitting: Family

## 2021-09-11 DIAGNOSIS — R809 Proteinuria, unspecified: Secondary | ICD-10-CM

## 2021-09-11 MED ORDER — LISINOPRIL 2.5 MG PO TABS: 2.5000 mg | ORAL_TABLET | Freq: Every day | ORAL | 1 refills | Status: DC

## 2021-09-11 NOTE — Progress Notes (Signed)
Urine microalbumin positive , recommend starting lisinopril 2.5 mg once daily, this will help protect kidneys from diabetes, long term. Urine microalbumin is early precurser kidney breakdown from diabetes.  ? ?Cholesterol mildly elevated, work on low cholesterol diet, exercise as tolerated. Continue lipitor 40 mg ? ?Hga1c 6.6, decrease from 7.0 getting closer to goal, continue ozempic 1 mg. ?B12 good ?Thyroid good

## 2021-09-24 ENCOUNTER — Other Ambulatory Visit: Payer: Self-pay | Admitting: Nurse Practitioner

## 2021-09-25 NOTE — Telephone Encounter (Signed)
Pt. Seen at different practice now. Requested Prescriptions  Refused Prescriptions Disp Refills  . ONETOUCH ULTRA test strip [employer] Med Name: [place] TEST STRP] 100 strip 1    Sig: USE AS DIRECTED TO MONITOR FSBS TWICE A DAY     Endocrinology: Diabetes - Testing Supplies Passed - 09/24/2021  2:10 AM      Passed - Valid encounter within last 12 months    Recent Outpatient Visits          4 months ago Type 2 diabetes mellitus with diabetic neuropathy, without long-term current use of insulin ([place])   [place] Eulogio Bear, NP   4 months ago Viral upper respiratory tract infection   [place] Noemi Chapel A, NP   8 months ago Type II diabetes mellitus with neurological manifestations ([place])   [facility] Eulogio Bear, NP   10 months ago Inflamed sebaceous cyst   [place] Susy Frizzle, MD   1 year ago Type II diabetes mellitus with neurological manifestations ([place])   [facility] Eulogio Bear, NP      Future Appointments            In 5 months Eugenia Pancoast, [place] at [place], [facility]

## 2021-12-10 ENCOUNTER — Other Ambulatory Visit: Payer: Self-pay | Admitting: Gastroenterology

## 2021-12-10 DIAGNOSIS — R1310 Dysphagia, unspecified: Secondary | ICD-10-CM

## 2021-12-12 ENCOUNTER — Ambulatory Visit
Admission: RE | Admit: 2021-12-12 | Discharge: 2021-12-12 | Disposition: A | Discharge status: Home / Self Care | Payer: Commercial Managed Care - PPO | Source: Ambulatory Visit | Attending: Gastroenterology | Admitting: Gastroenterology

## 2021-12-12 DIAGNOSIS — R1310 Dysphagia, unspecified: Secondary | ICD-10-CM

## 2022-02-10 ENCOUNTER — Encounter: Payer: Self-pay | Admitting: Family

## 2022-02-10 ENCOUNTER — Ambulatory Visit: Payer: Commercial Managed Care - PPO | Admitting: Family

## 2022-02-10 VITALS — BP 132/70 | HR 84 | Temp 98.6°F | Resp 16 | Ht 66.0 in | Wt 206.2 lb

## 2022-02-10 DIAGNOSIS — E1140 Type 2 diabetes mellitus with diabetic neuropathy, unspecified: Secondary | ICD-10-CM

## 2022-02-10 DIAGNOSIS — E785 Hyperlipidemia, unspecified: Secondary | ICD-10-CM

## 2022-02-10 DIAGNOSIS — M542 Cervicalgia: Secondary | ICD-10-CM | POA: Insufficient documentation

## 2022-02-10 DIAGNOSIS — R5382 Chronic fatigue, unspecified: Secondary | ICD-10-CM

## 2022-02-10 DIAGNOSIS — N951 Menopausal and female climacteric states: Secondary | ICD-10-CM

## 2022-02-10 DIAGNOSIS — E1169 Type 2 diabetes mellitus with other specified complication: Secondary | ICD-10-CM

## 2022-02-10 DIAGNOSIS — F411 Generalized anxiety disorder: Secondary | ICD-10-CM

## 2022-02-10 DIAGNOSIS — R809 Proteinuria, unspecified: Secondary | ICD-10-CM | POA: Diagnosis not present

## 2022-02-10 LAB — LIPID PANEL
Cholesterol: 196 mg/dL (ref 0–200)
HDL: 54.5 mg/dL (ref >39.00)
LDL Cholesterol: 114 mg/dL — ABNORMAL HIGH (ref 0–99)
NonHDL: 141.46
Total CHOL/HDL Ratio: 4
Triglycerides: 139 mg/dL (ref 0.0–149.0)
VLDL: 27.8 mg/dL (ref 0.0–40.0)

## 2022-02-10 LAB — TSH: TSH: 1.53 u[IU]/mL (ref 0.35–5.50)

## 2022-02-10 LAB — HEMOGLOBIN A1C: Hgb A1c MFr Bld: 6.9 % — ABNORMAL HIGH (ref 4.6–6.5)

## 2022-02-10 LAB — FOLLICLE STIMULATING HORMONE: FSH: 57 m[IU]/mL

## 2022-02-10 MED ORDER — LOSARTAN POTASSIUM 25 MG PO TABS: ORAL_TABLET | ORAL | 1 refills | Status: DC

## 2022-02-10 MED ORDER — SERTRALINE HCL 100 MG PO TABS: ORAL_TABLET | ORAL | 2 refills | Status: DC

## 2022-02-10 NOTE — Assessment & Plan Note (Signed)
Pt self d/c lisinopril due to s/e Start daily losartan 25 1/2 tablet

## 2022-02-10 NOTE — Assessment & Plan Note (Signed)
tsh prolactin fsh ordered pending results Increase sertraline to see if this helps  However may consider lexapro if this doesn't help as more efficacy with hot flashes with non hormonal treatment

## 2022-02-10 NOTE — Assessment & Plan Note (Signed)
Ordering tsh  Pending results

## 2022-02-10 NOTE — Progress Notes (Signed)
Established Patient Office Visit  Subjective:  Patient ID: Kimberly Solis, female    DOB: 11/07/1960  Age: 61 y.o. MRN: [id number]  CC:  Chief Complaint  Patient presents with  . Hot Flashes  . Fatigue    X couple months    HPI Kimberly Solis is here today with concerns.   Has increased stress, a lot of stressors going on currently in her life. Husband is becoming more ill, recent concussion with a fall. Trying to get husband on short term disability plus workman's comp and dealing with a lawyer just added to the stress. Waking up in the middle of the night in panic mode, hard to catch her breath. Not sleeping well at all which doesn't help. Feels mentally and physically drained.   No longer taking estradiol as well as no longer taking progesterone.      05/20/2021    8:39 AM 09/24/2018    9:33 AM  GAD 7 : Generalized Anxiety Score  Nervous, Anxious, on Edge 1 0  Control/stop worrying 2 0  Worry too much - different things 2 0  Trouble relaxing 1 0  Restless 0 0  Easily annoyed or irritable 1 0  Afraid - awful might happen 0 0  Total GAD 7 Score 7 0  Anxiety Difficulty Not difficult at all Not difficult at all       05/20/2021    8:38 AM 09/28/2019    8:35 AM 01/25/2019    8:04 AM  PHQ9 SCORE ONLY  PHQ-9 Total Score 4 4 0   Does report also with scratchy sensation of lower neck, on right lower side. She was seen by Gi Dr. Collene Mares and swallow study was negative. She states she thought it was maybe a lymph node.   Past Medical History:  Diagnosis Date  . Anxiety   . Diabetes mellitus without complication ([place])   . Hyperlipidemia   . Hypertension   . Type II diabetes mellitus with neurological manifestations ([place]) 03/15/2015  . Ulcerative colitis ([place]) 05/05/2005    Past Surgical History:  Procedure Laterality Date  . CERVICAL BIOPSY     May 2022  . DILATION AND CURETTAGE OF UTERUS    . ENDOMETRIAL BIOPSY     05/2020    Family History  Problem Relation  Age of Onset  . Irritable bowel syndrome Mother   . Parkinson's disease Mother   . Dementia Mother   . Hypertension Father   . Heart disease Father   . Diabetes Mellitus II Father   . Breast cancer Sister 65  . Parkinson's disease Maternal Grandfather   . Heart disease Maternal Grandfather   . Diabetes Mellitus II Maternal Grandfather     Social History   Socioeconomic History  . Marital status: Married    Spouse name: Not on file  . Number of children: Not on file  . Years of education: Not on file  . Highest education level: Not on file  Occupational History  . Not on file  Tobacco Use  . Smoking status: Former    Packs/day: 0.75    Years: 10.00    Total pack years: 7.50    Types: Cigarettes    Quit date: 04/20/1999    Years since quitting: 22.8  . Smokeless tobacco: Never  Substance and Sexual Activity  . Alcohol use: No  . Drug use: No  . Sexual activity: Not on file  Other Topics Concern  . 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   She is married.   Her husband has lupus.   Her daughter was recently diagnosed with lupus.   Her mother has Parkinson's disease. Patient is involved with helping care for her.   Social Determinants of Health   Financial Resource Strain: Not on file  Food Insecurity: Not on file  Transportation Needs: Not on file  Physical Activity: Not on file  Stress: Not on file  Social Connections: Not on file  Intimate Partner Violence: Not on file    Outpatient Medications Prior to Visit  Medication Sig Dispense Refill  . Adalimumab (HUMIRA) 40 MG/0.8ML PSKT Inject into the skin. QOW    . ALPRAZolam (XANAX) 0.5 MG tablet Take 1 tablet (0.5 mg total) by mouth at bedtime as needed for sleep. 60 tablet 0  . atorvastatin (LIPITOR) 40 MG tablet Take 1 tablet (40 mg total) by mouth daily. 90 tablet 3  . Barberry-Oreg Grape-Goldenseal (BERBERINE COMPLEX) 200-200-50 MG CAPS  Take by mouth.    . Blood Glucose Monitoring Suppl (BLOOD GLUCOSE SYSTEM PAK) KIT Please dispense based on patient and insurance preference. Use as directed to monitor FSBS 2x daily. Dx: E11.9 1 kit 1  . Coenzyme Q10 (CO Q-10 PO) Take by mouth.    . ferrous sulfate 325 (65 FE) MG tablet Take 325 mg by mouth daily with breakfast.    . magnesium citrate SOLN Take 1 Bottle by mouth once.    . metFORMIN (GLUCOPHAGE-XR) 500 MG 24 hr tablet Take 1 tablet (500 mg total) by mouth daily with breakfast. 90 tablet 1  . Multiple Vitamin (MULTIVITAMIN) tablet Take 1 tablet by mouth daily. [facility] Women's +50    . OneTouch Delica Lancets 95J MISC USE AS DIRECTED TO MONITOR FSBS 2X DAILY 100 each 1  . ONETOUCH ULTRA test strip USE AS DIRECTED TO MONITOR FSBS TWICE A DAY 100 strip 1  . Semaglutide, 1 MG/DOSE, 4 MG/3ML SOPN Inject 1 mg as directed once a week. 9 mL 1  . sertraline (ZOLOFT) 100 MG tablet Take 1 tablet (100 mg total) by mouth daily. 90 tablet 1  . estradiol (VIVELLE-DOT) 0.05 MG/24HR patch 1 patch 2 (two) times a week.    Marland Kitchen lisinopril (ZESTRIL) 2.5 MG tablet Take 1 tablet (2.5 mg total) by mouth daily. 90 tablet 1  . progesterone (PROMETRIUM) 100 MG capsule Take 100 mg by mouth at bedtime.     No facility-administered medications prior to visit.    Allergies  Allergen Reactions  . Doxycycline Shortness Of Breath  . Mesalamine Er Shortness Of Breath    And dizziness  . Azathioprine Nausea And Vomiting  . Lisinopril Other (See Comments)    headaches        Objective:    Physical Exam Constitutional:      General: She is not in acute distress.    Appearance: Normal appearance. She is obese. She is not ill-appearing, toxic-appearing or diaphoretic.  HENT:     Right Ear: Tympanic membrane normal.     Left Ear: Tympanic membrane normal.     Nose: Nose normal. No congestion or rhinorrhea.     Right Turbinates: Not enlarged or swollen.     Left Turbinates: Not enlarged or swollen.      Right Sinus: No maxillary sinus tenderness or frontal sinus tenderness.     Left Sinus: No maxillary sinus tenderness or frontal sinus tenderness.  Mouth/Throat:     Mouth: Mucous membranes are moist.     Pharynx: No pharyngeal swelling, oropharyngeal exudate or posterior oropharyngeal erythema.     Tonsils: No tonsillar exudate.  Eyes:     Extraocular Movements: Extraocular movements intact.     Conjunctiva/sclera: Conjunctivae normal.     Pupils: Pupils are equal, round, and reactive to light.  Neck:     Thyroid: Thyroid tenderness (right lower side) present. No thyroid mass or thyromegaly.  Cardiovascular:     Rate and Rhythm: Normal rate and regular rhythm.  Pulmonary:     Effort: Pulmonary effort is normal.     Breath sounds: Normal breath sounds.  Musculoskeletal:     Cervical back: No pain with movement.  Lymphadenopathy:     Cervical:     Right cervical: No superficial cervical adenopathy.    Left cervical: No superficial cervical adenopathy.  Neurological:     Mental Status: She is alert.    BP 132/70   Pulse 84   Temp 98.6 F (37 C)   Resp 16   Ht _0  (1.676 m)   Wt 206 lb 4 oz (93.6 kg)   LMP  (LMP Unknown)   SpO2 99%   BMI 33.29 kg/m  Wt Readings from Last 3 Encounters:  02/10/22 206 lb 4 oz (93.6 kg)  09/10/21 202 lb 4 oz (91.7 kg)  05/20/21 199 lb (90.3 kg)     Health Maintenance Due  Topic Date Due  . COVID-19 Vaccine (1) Never done  . Zoster Vaccines- Shingrix (1 of 2) Never done  . OPHTHALMOLOGY EXAM  03/22/2021  . INFLUENZA VACCINE  12/03/2021    There are no preventive care reminders to display for this patient.  Lab Results  Component Value Date   TSH 1.19 09/10/2021   Lab Results  Component Value Date   WBC 7.9 09/10/2021   HGB 13.7 09/10/2021   HCT 41.6 09/10/2021   MCV 88.9 09/10/2021   PLT 388.0 09/10/2021   Lab Results  Component Value Date   NA 140 09/10/2021   K 5.0 09/10/2021   CO2 29 09/10/2021   GLUCOSE 130 (H)  09/10/2021   BUN 14 09/10/2021   CREATININE 0.83 09/10/2021   BILITOT 0.5 09/10/2021   ALKPHOS 80 09/10/2021   AST 18 09/10/2021   ALT 15 09/10/2021   PROT 7.7 09/10/2021   ALBUMIN 4.7 09/10/2021   CALCIUM 10.0 09/10/2021   EGFR 95 05/20/2021   GFR 76.80 09/10/2021   Lab Results  Component Value Date   HGBA1C 6.6 (H) 09/10/2021      Assessment & Plan:   Problem List Items Addressed This Visit       Cardiovascular and Mediastinum   Hot flashes due to menopause    tsh prolactin fsh ordered pending results Increase sertraline to see if this helps  However may consider lexapro if this doesn't help as more efficacy with hot flashes with non hormonal treatment      Relevant Medications   losartan (COZAAR) 25 MG tablet   Other Relevant Orders   Follicle stimulating hormone   Prolactin   TSH     Endocrine   Hyperlipidemia associated with type 2 diabetes mellitus ([place])   Relevant Medications   losartan (COZAAR) 25 MG tablet   Other Relevant Orders   Lipid panel   Type 2 diabetes mellitus with diabetic neuropathy, without long-term current use of insulin ([place]) - Primary   Relevant Medications   losartan (COZAAR)  25 MG tablet   Other Relevant Orders   Hemoglobin A1c     Other   GAD (generalized anxiety disorder)    Increase sertraline to 200 mg once daily Let's see if this helps with hot flashes Work on anxiety reducing techniques F/u one month      Relevant Medications   sertraline (ZOLOFT) 100 MG tablet   Chronic fatigue    Ordering tsh  Pending results      Tenderness of neck    No palpation lymph nodes U/s thyroid /neck ordered pending results      Relevant Orders   US THYROID   Microalbuminuria    Pt self d/c lisinopril due to s/e Start daily losartan 25 1/2 tablet      Relevant Medications   losartan (COZAAR) 25 MG tablet    Meds ordered this encounter  Medications  . losartan (COZAAR) 25 MG tablet    Sig: Start 1/2 tablet once daily     Dispense:  45 tablet    Refill:  1    Order Specific Question:   Supervising Provider    Answer:   BEDSOLE, AMY E [[id number]]  . sertraline (ZOLOFT) 100 MG tablet    Sig: Take two tablets po qd    Dispense:  60 tablet    Refill:  2    Order Specific Question:   Supervising Provider    Answer:   Diona Browner, AMY E [[id number]]    Follow-up: Return in about 1 month (around 03/13/2022) for AS SCHEDULED ALREADY FOR CPE .    Eugenia Pancoast, FNP

## 2022-02-10 NOTE — Assessment & Plan Note (Signed)
No palpation lymph nodes U/s thyroid /neck ordered pending results

## 2022-02-10 NOTE — Assessment & Plan Note (Signed)
Increase sertraline to 200 mg once daily Let's see if this helps with hot flashes Work on anxiety reducing techniques F/u one month

## 2022-02-10 NOTE — Patient Instructions (Signed)
  I have ordered your thyroid ultrasound electronically. Please call to schedule.    [address] [place] Phone [phone],  8-430 pm   Stop by the lab prior to leaving today. I will notify you of your results once received.   RECOMMEND STARTING DAILY FLONASE NOSE SPRAY.  INCREASE SERTRALINE TO 200 MG ONCE DAILY.   Regards,   Eugenia Pancoast FNP-C

## 2022-02-11 ENCOUNTER — Ambulatory Visit
Admission: RE | Admit: 2022-02-11 | Discharge: 2022-02-11 | Disposition: A | Discharge status: Home / Self Care | Payer: Commercial Managed Care - PPO | Source: Ambulatory Visit | Attending: Family | Admitting: Family

## 2022-02-11 DIAGNOSIS — M542 Cervicalgia: Secondary | ICD-10-CM

## 2022-02-11 LAB — PROLACTIN: Prolactin: 11.2 ng/mL

## 2022-02-11 NOTE — Progress Notes (Signed)
Diabetes not as well controlled, increasing, now at 6.9.  Would you be willing to increase semaglutide to 2 mg dose once weekly? Cholesterol slight improvement however still ldl >70 desired, at 114. Are you taking atorvastatin as indicated, once nightly?   Prolactin normal limits for postmenopausal Thyroid and fsh unremarkable, also hormonal

## 2022-02-12 ENCOUNTER — Other Ambulatory Visit: Payer: Self-pay | Admitting: Family

## 2022-02-12 DIAGNOSIS — E1140 Type 2 diabetes mellitus with diabetic neuropathy, unspecified: Secondary | ICD-10-CM

## 2022-02-12 MED ORDER — SEMAGLUTIDE (2 MG/DOSE) 8 MG/3ML ~~LOC~~ SOPN: 2.0000 mg | PEN_INJECTOR | SUBCUTANEOUS | 2 refills | Status: DC

## 2022-02-21 ENCOUNTER — Encounter: Payer: Self-pay | Admitting: Family

## 2022-02-21 DIAGNOSIS — E1140 Type 2 diabetes mellitus with diabetic neuropathy, unspecified: Secondary | ICD-10-CM

## 2022-02-23 ENCOUNTER — Other Ambulatory Visit: Payer: Self-pay | Admitting: Family

## 2022-02-23 DIAGNOSIS — E1140 Type 2 diabetes mellitus with diabetic neuropathy, unspecified: Secondary | ICD-10-CM

## 2022-02-26 MED ORDER — TRULICITY 1.5 MG/0.5ML ~~LOC~~ SOAJ: 1.5000 mg | SUBCUTANEOUS | 2 refills | Status: DC

## 2022-02-27 ENCOUNTER — Telehealth: Payer: Self-pay

## 2022-02-27 NOTE — Telephone Encounter (Signed)
Prior auth started for Trulicity 9.9OZ/2.9KY pen-injectors. Dorian Pod Key: [id number] - PA Case ID: [id number] - Rx #: [id number] Waiting for determination.

## 2022-02-27 NOTE — Telephone Encounter (Signed)
Noted  

## 2022-03-03 NOTE — Telephone Encounter (Signed)
Prior auth for Trulicity 2.5GM/7.1XB pen-injectors has been approved. [facility]ian Pod Key: [id number] - PA Case ID: [id number] - Rx #: [id number] Approved today PA Case: [id number], Status: Approved, Coverage Starts on: 03/03/2022 12:00:00 AM, Coverage Ends on: 08/20/2022  Patient notified via mychart.

## 2022-03-03 NOTE — Telephone Encounter (Signed)
Noted  

## 2022-03-05 ENCOUNTER — Other Ambulatory Visit: Payer: Self-pay | Admitting: Family

## 2022-03-05 DIAGNOSIS — F411 Generalized anxiety disorder: Secondary | ICD-10-CM

## 2022-03-07 ENCOUNTER — Other Ambulatory Visit: Payer: Self-pay | Admitting: Family

## 2022-03-07 DIAGNOSIS — R809 Proteinuria, unspecified: Secondary | ICD-10-CM

## 2022-03-13 ENCOUNTER — Encounter: Payer: Self-pay | Admitting: Family

## 2022-03-13 ENCOUNTER — Ambulatory Visit (INDEPENDENT_AMBULATORY_CARE_PROVIDER_SITE_OTHER): Payer: Commercial Managed Care - PPO | Admitting: Family

## 2022-03-13 VITALS — BP 132/74 | HR 77 | Temp 98.0°F | Resp 16 | Ht 65.25 in | Wt 207.5 lb

## 2022-03-13 DIAGNOSIS — K5010 Crohn's disease of large intestine without complications: Secondary | ICD-10-CM

## 2022-03-13 DIAGNOSIS — E1140 Type 2 diabetes mellitus with diabetic neuropathy, unspecified: Secondary | ICD-10-CM

## 2022-03-13 DIAGNOSIS — H409 Unspecified glaucoma: Secondary | ICD-10-CM

## 2022-03-13 DIAGNOSIS — F411 Generalized anxiety disorder: Secondary | ICD-10-CM | POA: Diagnosis not present

## 2022-03-13 DIAGNOSIS — J301 Allergic rhinitis due to pollen: Secondary | ICD-10-CM

## 2022-03-13 DIAGNOSIS — Z0001 Encounter for general adult medical examination with abnormal findings: Secondary | ICD-10-CM

## 2022-03-13 MED ORDER — ALPRAZOLAM 0.5 MG PO TABS: 0.5000 mg | ORAL_TABLET | Freq: Every evening | ORAL | 5 refills | Status: DC | PRN

## 2022-03-13 MED ORDER — TRULICITY 1.5 MG/0.5ML ~~LOC~~ SOAJ: 1.5000 mg | SUBCUTANEOUS | 2 refills | Status: DC

## 2022-03-13 NOTE — Progress Notes (Signed)
New Patient Office Visit  Subjective:  Patient ID: Kimberly Solis, female    DOB: 1960/08/30  Age: 61 y.o. MRN: [id number]  CC:  Chief Complaint  Patient presents with   Annual Exam    HPI Kimberly Solis is here for annual physical exam.   Shingrix vaccination: pt fearful to get shingles vaccination because she notices while on humira she has had side effects to other vaccinations. She will d/w her Gi doctor about this more in detail.   Flu vaccine: pt refuses.  Mammogram: 07/01/21  Colonoscopy: 07/19/2018, every five years  Pap: 01/28/2021, repeat in 2025 Tetanus up to date. Dexa: up to date per pt we need records   Post nasal drip, taking flonase daily which has been helpful with PND but still gets itchy sensation in throat at times.   DM2: trulicity is not in stock, she is currently without this medication at this time. She has been taking metformin 500 mg twice daily. Urine microalbumin 09/10/21.  Lab Results  Component Value Date   HGBA1C 6.9 (H) 02/10/2022    Glaucoma: last visit was last year, due for repeat annual exam and has scheduled December 7th. Not with drops or anything, stable without medication for now.   Microalbumin: on losartan, doing well.   HLD: on atorvastatin 40 mg once daily.  Lab Results  Component Value Date   CHOL 196 02/10/2022   HDL 54.50 02/10/2022   LDLCALC 114 (H) 02/10/2022   TRIG 139.0 02/10/2022   CHOLHDL 4 02/10/2022     Past Medical History:  Diagnosis Date   Anxiety    Diabetes mellitus without complication ([place])    Hyperlipidemia    Hypertension    Type II diabetes mellitus with neurological manifestations ([place]) 03/15/2015   Ulcerative colitis ([place]) 05/05/2005    Past Surgical History:  Procedure Laterality Date   CERVICAL BIOPSY     May 2022   DILATION AND CURETTAGE OF UTERUS     ENDOMETRIAL BIOPSY     05/2020    Family History  Problem Relation Age of Onset   Irritable bowel syndrome Mother    Parkinson's  disease Mother    Dementia Mother    Hypertension Father    Heart disease Father    Diabetes Mellitus II Father    Breast cancer Sister 81   Parkinson's disease Maternal Grandfather    Heart disease Maternal Grandfather    Diabetes Mellitus II Maternal Grandfather     Social History   Socioeconomic History   Marital status: Married    Spouse name: Not on file   Number of children: Not on file   Years of education: Not on file   Highest education level: Not on file  Occupational History   Not on file  Tobacco Use   Smoking status: Former    Packs/day: 0.75    Years: 10.00    Total pack years: 7.50    Types: Cigarettes    Quit date: 04/20/1999    Years since quitting: 22.9   Smokeless tobacco: Never  Vaping Use   Vaping Use: Not on file  Substance and Sexual Activity   Alcohol use: No   Drug use: No   Sexual activity: Not on file  Other Topics Concern   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   She is married.   Her husband has lupus.   Her daughter was recently  diagnosed with lupus.   Her mother has Parkinson's disease. Patient is involved with helping care for her.   Social Determinants of Health   Financial Resource Strain: Not on file  Food Insecurity: Not on file  Transportation Needs: Not on file  Physical Activity: Not on file  Stress: Not on file  Social Connections: Not on file  Intimate Partner Violence: Not on file    Outpatient Medications Prior to Visit  Medication Sig Dispense Refill   Adalimumab (HUMIRA) 40 MG/0.8ML PSKT Inject into the skin. QOW     atorvastatin (LIPITOR) 40 MG tablet Take 1 tablet (40 mg total) by mouth daily. 90 tablet 3   Barberry-Oreg Grape-Goldenseal (BERBERINE COMPLEX) 200-200-50 MG CAPS Take by mouth.     Blood Glucose Monitoring Suppl (BLOOD GLUCOSE SYSTEM PAK) KIT Please dispense based on patient and insurance preference. Use as directed to monitor  FSBS 2x daily. Dx: E11.9 1 kit 1   Coenzyme Q10 (CO Q-10 PO) Take by mouth.     ferrous sulfate 325 (65 FE) MG tablet Take 325 mg by mouth daily with breakfast.     fluticasone (FLONASE) 50 MCG/ACT nasal spray Place 2 sprays into both nostrils daily.     losartan (COZAAR) 25 MG tablet Start 1/2 tablet once daily 45 tablet 1   Magnesium 300 MG CAPS Take 300 mg by mouth daily.     metFORMIN (GLUCOPHAGE-XR) 500 MG 24 hr tablet TAKE 1 TABLET BY MOUTH EVERY DAY WITH BREAKFAST 90 tablet 1   Multiple Vitamin (MULTIVITAMIN) tablet Take 1 tablet by mouth daily. [facility] +38     OneTouch Delica Lancets 75I MISC USE AS DIRECTED TO MONITOR FSBS 2X DAILY 100 each 1   ONETOUCH ULTRA test strip USE AS DIRECTED TO MONITOR FSBS TWICE A DAY 100 strip 1   sertraline (ZOLOFT) 100 MG tablet TAKE 2 TABLETS DAILY 180 tablet 1   ALPRAZolam (XANAX) 0.5 MG tablet Take 1 tablet (0.5 mg total) by mouth at bedtime as needed for sleep. 60 tablet 0   Dulaglutide (TRULICITY) 1.5 EP/3.2RJ SOPN Inject 1.5 mg into the skin once a week. 2 mL 2   magnesium citrate SOLN Take 1 Bottle by mouth once.     No facility-administered medications prior to visit.    Allergies  Allergen Reactions   Doxycycline Shortness Of Breath   Mesalamine Er Shortness Of Breath    And dizziness   Azathioprine Nausea And Vomiting   Lisinopril Other (See Comments)    headaches    ROS Review of Systems  Review of Systems  Respiratory:  Negative for shortness of breath.   Cardiovascular:  Negative for chest pain and palpitations.  Gastrointestinal:  Negative for constipation and diarrhea.  Genitourinary:  Negative for dysuria, frequency and urgency.  Musculoskeletal:  Negative for myalgias.  Psychiatric/Behavioral:  Negative for depression and suicidal ideas.   All other systems reviewed and are negative.    Objective:    Physical Exam Vitals reviewed.  Constitutional:      General: She is not in acute distress.    Appearance:  Normal appearance. She is obese. She is not ill-appearing or toxic-appearing.  HENT:     Right Ear: Tympanic membrane normal.     Left Ear: Tympanic membrane normal.     Mouth/Throat:     Mouth: Mucous membranes are moist.     Pharynx: No pharyngeal swelling.     Tonsils: No tonsillar exudate.  Eyes:     Extraocular Movements:  Extraocular movements intact.     Conjunctiva/sclera: Conjunctivae normal.     Pupils: Pupils are equal, round, and reactive to light.  Neck:     Thyroid: No thyroid mass.  Cardiovascular:     Rate and Rhythm: Normal rate and regular rhythm.  Pulmonary:     Effort: Pulmonary effort is normal.     Breath sounds: Normal breath sounds.  Abdominal:     General: Abdomen is flat. Bowel sounds are normal.     Palpations: Abdomen is soft.  Musculoskeletal:        General: Normal range of motion.  Lymphadenopathy:     Cervical:     Right cervical: No superficial cervical adenopathy.    Left cervical: No superficial cervical adenopathy.  Skin:    General: Skin is warm.     Capillary Refill: Capillary refill takes less than 2 seconds.  Neurological:     General: No focal deficit present.     Mental Status: She is alert and oriented to person, place, and time.  Psychiatric:        Mood and Affect: Mood normal.        Behavior: Behavior normal.        Thought Content: Thought content normal.        Judgment: Judgment normal.      BP 132/74   Pulse 77   Temp 98 F (36.7 C)   Resp 16   Ht 5' 5.25" (1.657 m)   Wt 207 lb 8 oz (94.1 kg)   LMP  (LMP Unknown)   SpO2 99%   BMI 34.27 kg/m  Wt Readings from Last 3 Encounters:  03/13/22 207 lb 8 oz (94.1 kg)  02/10/22 206 lb 4 oz (93.6 kg)  09/10/21 202 lb 4 oz (91.7 kg)     Health Maintenance Due  Topic Date Due   Zoster Vaccines- Shingrix (1 of 2) Never done   OPHTHALMOLOGY EXAM  03/22/2021    There are no preventive care reminders to display for this patient.  Lab Results  Component Value Date    TSH 1.53 02/10/2022   Lab Results  Component Value Date   WBC 7.9 09/10/2021   HGB 13.7 09/10/2021   HCT 41.6 09/10/2021   MCV 88.9 09/10/2021   PLT 388.0 09/10/2021   Lab Results  Component Value Date   NA 140 09/10/2021   K 5.0 09/10/2021   CO2 29 09/10/2021   GLUCOSE 130 (H) 09/10/2021   BUN 14 09/10/2021   CREATININE 0.83 09/10/2021   BILITOT 0.5 09/10/2021   ALKPHOS 80 09/10/2021   AST 18 09/10/2021   ALT 15 09/10/2021   PROT 7.7 09/10/2021   ALBUMIN 4.7 09/10/2021   CALCIUM 10.0 09/10/2021   EGFR 95 05/20/2021   GFR 76.80 09/10/2021   Lab Results  Component Value Date   CHOL 196 02/10/2022   Lab Results  Component Value Date   HDL 54.50 02/10/2022   Lab Results  Component Value Date   LDLCALC 114 (H) 02/10/2022   Lab Results  Component Value Date   TRIG 139.0 02/10/2022   Lab Results  Component Value Date   CHOLHDL 4 02/10/2022   Lab Results  Component Value Date   HGBA1C 6.9 (H) 02/10/2022      Assessment & Plan:   Problem List Items Addressed This Visit       Respiratory   Seasonal allergic rhinitis due to pollen    Continue flonase. Suggested night time zyrtec prn  Digestive   Crohn's colitis ([place]) - Primary    Continue with humira and continue f/u with GI as scheduled.         Endocrine   Type 2 diabetes mellitus with diabetic neuropathy, without long-term current use of insulin ([place])    Advised pt to continue metformin 500 mg twice daily  Will resent rx for trulicity 1.5 mg qweek advised pt to let me know if not in stock or runs into problems filling. Work on diabetic diet and exercise as tolerated. Yearly foot exam, and annual eye exam.        Relevant Medications   Dulaglutide (TRULICITY) 1.5 PY/0.9XI SOPN     Other   GAD (generalized anxiety disorder)   Relevant Medications   ALPRAZolam (XANAX) 0.5 MG tablet   Glaucoma (increased eye pressure)    Maintain annual f/u with ophthalmology as scheduled.       Encounter for general adult medical examination with abnormal findings    Patient Counseling(The following topics were reviewed):  Preventative care handout given to pt  Health maintenance and immunizations reviewed. Please refer to Health maintenance section. Pt advised on safe sex, wearing seatbelts in car, and proper nutrition labwork ordered today for annual Dental health: Discussed importance of regular tooth brushing, flossing, and dental visits.  Pt declines shingrix for now, will d/w with her Gi. Did advise more at risk as immunocompromised, she will consider this.  Refuses flu vaccine.        Meds ordered this encounter  Medications   Dulaglutide (TRULICITY) 1.5 PJ/8.2NK SOPN    Sig: Inject 1.5 mg into the skin once a week. Prior auth for Trulicity 5.3ZJ/6.7HA pen-injectors has been approved. Dorian Pod Key: [id number] - PA Case ID: [id number] - Rx #: [id number] Approved today PA Case: [id number], Status: Approved, Coverage Starts on: 03/03/2022 12:00:00 AM, Coverage Ends on: 08/20/2022    Dispense:  2 mL    Refill:  2    Order Specific Question:   Supervising Provider    Answer:   Diona Browner, AMY E [[id number]]   ALPRAZolam (XANAX) 0.5 MG tablet    Sig: Take 1 tablet (0.5 mg total) by mouth at bedtime as needed for sleep.    Dispense:  30 tablet    Refill:  5    Order Specific Question:   Supervising Provider    Answer:   Diona Browner, AMY E [[id number]]    Follow-up: Return in about 3 months (around 06/13/2022) for f/u diabetes.    Eugenia Pancoast, FNP

## 2022-03-13 NOTE — Patient Instructions (Signed)
  Shrink Yourself: Break Free from [employer] (check this book out)   Stop by the lab prior to leaving today. I will notify you of your results once received.   Recommendations on keeping yourself healthy:  - Exercise at least 30-45 minutes a day, 3-4 days a week.  - Eat a low-fat diet with lots of fruits and vegetables, up to 7-9 servings per day.  - Seatbelts can save your life. Wear them always.  - Smoke detectors on every level of your home, check batteries every year.  - Eye Doctor - have an eye exam every 1-2 years  - Safe sex - if you may be exposed to STDs, use a condom.  - Alcohol -  If you drink, do it moderately, less than 2 drinks per day.  - [place]. Choose someone to speak for you if you are not able.  - Depression is common in our stressful world.If you're feeling down or losing interest in things you normally enjoy, please come in for a visit.  - Violence - If anyone is threatening or hurting you, please call immediately.  Due to recent changes in healthcare laws, you may see results of your imaging and/or laboratory studies on MyChart before I have had a chance to review them.  I understand that in some cases there may be results that are confusing or concerning to you. Please understand that not all results are received at the same time and often I may need to interpret multiple results in order to provide you with the best plan of care or course of treatment. Therefore, I ask that you please give me 2 business days to thoroughly review all your results before contacting my office for clarification. Should we see a critical lab result, you will be contacted sooner.   I will see you again in one year for your annual comprehensive exam unless otherwise stated and or with acute concerns.  It was a pleasure seeing you today! Please do not hesitate to reach out with any questions and or concerns.  Regards,   Eugenia Pancoast

## 2022-03-14 DIAGNOSIS — J301 Allergic rhinitis due to pollen: Secondary | ICD-10-CM | POA: Insufficient documentation

## 2022-03-14 DIAGNOSIS — Z0001 Encounter for general adult medical examination with abnormal findings: Secondary | ICD-10-CM | POA: Insufficient documentation

## 2022-03-14 NOTE — Assessment & Plan Note (Signed)
Advised pt to continue metformin 500 mg twice daily  Will resent rx for trulicity 1.5 mg qweek advised pt to let me know if not in stock or runs into problems filling. Work on diabetic diet and exercise as tolerated. Yearly foot exam, and annual eye exam.

## 2022-03-14 NOTE — Assessment & Plan Note (Signed)
Continue flonase. Suggested night time zyrtec prn

## 2022-03-14 NOTE — Assessment & Plan Note (Signed)
Maintain annual f/u with ophthalmology as scheduled.

## 2022-03-14 NOTE — Assessment & Plan Note (Signed)
Continue with humira and continue f/u with GI as scheduled.

## 2022-03-14 NOTE — Assessment & Plan Note (Signed)
Patient Counseling(The following topics were reviewed):  Preventative care handout given to pt  Health maintenance and immunizations reviewed. Please refer to Health maintenance section. Pt advised on safe sex, wearing seatbelts in car, and proper nutrition labwork ordered today for annual Dental health: Discussed importance of regular tooth brushing, flossing, and dental visits.  Pt declines shingrix for now, will d/w with her Gi. Did advise more at risk as immunocompromised, she will consider this.  Refuses flu vaccine.

## 2022-04-10 ENCOUNTER — Encounter: Payer: Self-pay | Admitting: Family

## 2022-04-10 LAB — HM DIABETES EYE EXAM

## 2022-05-14 ENCOUNTER — Encounter: Payer: Self-pay | Admitting: Family

## 2022-06-09 ENCOUNTER — Other Ambulatory Visit: Payer: Self-pay | Admitting: Family

## 2022-06-09 DIAGNOSIS — E1140 Type 2 diabetes mellitus with diabetic neuropathy, unspecified: Secondary | ICD-10-CM

## 2022-06-09 NOTE — Telephone Encounter (Signed)
Medication is c

## 2022-06-16 ENCOUNTER — Encounter: Payer: Self-pay | Admitting: Family

## 2022-06-16 ENCOUNTER — Ambulatory Visit: Payer: Commercial Managed Care - PPO | Admitting: Family

## 2022-06-16 VITALS — BP 132/82 | HR 82 | Temp 98.1°F | Ht 65.25 in | Wt 211.0 lb

## 2022-06-16 DIAGNOSIS — E785 Hyperlipidemia, unspecified: Secondary | ICD-10-CM | POA: Diagnosis not present

## 2022-06-16 DIAGNOSIS — E1140 Type 2 diabetes mellitus with diabetic neuropathy, unspecified: Secondary | ICD-10-CM

## 2022-06-16 DIAGNOSIS — M62838 Other muscle spasm: Secondary | ICD-10-CM | POA: Diagnosis not present

## 2022-06-16 DIAGNOSIS — F411 Generalized anxiety disorder: Secondary | ICD-10-CM | POA: Diagnosis not present

## 2022-06-16 DIAGNOSIS — E1169 Type 2 diabetes mellitus with other specified complication: Secondary | ICD-10-CM

## 2022-06-16 LAB — LIPID PANEL
Cholesterol: 183 mg/dL (ref 0–200)
HDL: 51.1 mg/dL (ref >39.00)
LDL Cholesterol: 109 mg/dL — ABNORMAL HIGH (ref 0–99)
NonHDL: 131.99
Total CHOL/HDL Ratio: 4
Triglycerides: 113 mg/dL (ref 0.0–149.0)
VLDL: 22.6 mg/dL (ref 0.0–40.0)

## 2022-06-16 LAB — MICROALBUMIN / CREATININE URINE RATIO
Creatinine,U: 177.3 mg/dL
Microalb Creat Ratio: 0.8 mg/g (ref 0.0–30.0)
Microalb, Ur: 1.4 mg/dL (ref 0.0–1.9)

## 2022-06-16 LAB — HEMOGLOBIN A1C: Hgb A1c MFr Bld: 7.3 % — ABNORMAL HIGH (ref 4.6–6.5)

## 2022-06-16 MED ORDER — CYCLOBENZAPRINE HCL 10 MG PO TABS: ORAL_TABLET | ORAL | 0 refills | Status: DC

## 2022-06-16 MED ORDER — SERTRALINE HCL 100 MG PO TABS: ORAL_TABLET | ORAL | 3 refills | Status: DC

## 2022-06-16 NOTE — Patient Instructions (Signed)
  A referral was placed today for therapy.  Please let us know if you have not heard back within 2 weeks about the referral.  Stop by the lab prior to leaving today. I will notify you of your results once received.    Regards,   Eugenia Pancoast FNP-C

## 2022-06-16 NOTE — Assessment & Plan Note (Signed)
Flexeril prn  Work on heat and neck exercise, sent to [facility].

## 2022-06-16 NOTE — Progress Notes (Signed)
Established Patient Office Visit  Subjective:      CC:  Chief Complaint  Patient presents with   Medical Management of Chronic Issues    Diabetes    HPI: Kimberly Solis is a 62 y.o. female presenting on 06/16/2022 for Medical Management of Chronic Issues (Diabetes) . DM2: on trulicity however on backorder, however has not called back to check. Still taking, has one pen left.  Metformin also taking, tolerating well once daily.  Lab Results  Component Value Date   HGBA1C 6.9 (H) 02/10/2022   GAD, slight depression: has a lot of ongoing stress, and states worse over the last few months. Worries great over insurance needs and when she thinks she might no longer have insurance. Her husband as been out since August of last year, he is having to go to several doctors which increases her stress. On sertraline 100 mg has noticed a difference. Also caring for a three year old grandson, but this is not something she wants to keep going because salary from watching him is helping her financially.   New complaints:  Hard to get herself to fall herself to turn off her stressors to calm down to sleep.      Social history:  Relevant past medical, surgical, family and social history reviewed and updated as indicated. Interim medical history since our last visit reviewed.  Allergies and medications reviewed and updated.  DATA REVIEWED: CHART IN EPIC     ROS: Negative unless specifically indicated above in HPI.    Current Outpatient Medications:    Adalimumab (HUMIRA) 40 MG/0.8ML PSKT, Inject into the skin. QOW, Disp: , Rfl:    ALPRAZolam (XANAX) 0.5 MG tablet, Take 1 tablet (0.5 mg total) by mouth at bedtime as needed for sleep., Disp: 30 tablet, Rfl: 5   atorvastatin (LIPITOR) 40 MG tablet, Take 1 tablet (40 mg total) by mouth daily., Disp: 90 tablet, Rfl: 3   Barberry-Oreg Grape-Goldenseal (BERBERINE COMPLEX) 200-200-50 MG CAPS, Take by mouth., Disp: , Rfl:    Blood Glucose  Monitoring Suppl (BLOOD GLUCOSE SYSTEM PAK) KIT, Please dispense based on patient and insurance preference. Use as directed to monitor FSBS 2x daily. Dx: E11.9, Disp: 1 kit, Rfl: 1   Coenzyme Q10 (CO Q-10 PO), Take by mouth., Disp: , Rfl:    cyclobenzaprine (FLEXERIL) 10 MG tablet, Take 1/2 to one tablet po qhs prn muscle, Disp: 30 tablet, Rfl: 0   Dulaglutide (TRULICITY) 1.5 0000000 SOPN, INJECT 1.5 MG INTO THE SKIN ONCE A WEEK, Disp: 2 mL, Rfl: 2   ferrous sulfate 325 (65 FE) MG tablet, Take 325 mg by mouth daily with breakfast., Disp: , Rfl:    fluticasone (FLONASE) 50 MCG/ACT nasal spray, Place 2 sprays into both nostrils daily., Disp: , Rfl:    losartan (COZAAR) 25 MG tablet, Start 1/2 tablet once daily, Disp: 45 tablet, Rfl: 1   Magnesium 300 MG CAPS, Take 300 mg by mouth daily., Disp: , Rfl:    metFORMIN (GLUCOPHAGE-XR) 500 MG 24 hr tablet, TAKE 1 TABLET BY MOUTH EVERY DAY WITH BREAKFAST, Disp: 90 tablet, Rfl: 1   Multiple Vitamin (MULTIVITAMIN) tablet, Take 1 tablet by mouth daily. [facility] +50, Disp: , Rfl:    OneTouch Delica Lancets 99991111 MISC, USE AS DIRECTED TO MONITOR FSBS 2X DAILY, Disp: 100 each, Rfl: 1   ONETOUCH ULTRA test strip, USE AS DIRECTED TO MONITOR FSBS TWICE A DAY, Disp: 100 strip, Rfl: 1   sertraline (ZOLOFT) 100 MG tablet,  Take two tablets once daily, Disp: 60 tablet, Rfl: 3   Turmeric (QC TUMERIC COMPLEX) 500 MG CAPS, Tumeric, Disp: , Rfl:       Objective:    BP 132/82   Pulse 82   Temp 98.1 F (36.7 C) (Temporal)   Ht 5' 5.25" (1.657 m)   Wt 211 lb (95.7 kg)   LMP  (LMP Unknown)   SpO2 98%   BMI 34.84 kg/m   Wt Readings from Last 3 Encounters:  06/16/22 211 lb (95.7 kg)  03/13/22 207 lb 8 oz (94.1 kg)  02/10/22 206 lb 4 oz (93.6 kg)    Physical Exam  Physical Exam Constitutional:      General: not in acute distress.    Appearance: Normal appearance. normal weight. is not ill-appearing, toxic-appearing or diaphoretic.  Cardiovascular:     Rate  and Rhythm: Normal rate.  Pulmonary:     Effort: Pulmonary effort is normal.  Musculoskeletal:        General: Normal range of motion.  Neurological:     General: No focal deficit present.     Mental Status: alert and oriented to person, place, and time. Mental status is at baseline.  Psychiatric:        Mood and Affect: Mood normal.        Behavior: Behavior normal.        Thought Content: Thought content normal.        Judgment: Judgment normal.        Assessment & Plan:  GAD (generalized anxiety disorder) Assessment & Plan: Increase to sertraline to 200 mg  Work on anxiety reducing techniques  Referral placed for therapy   Orders: -     Ambulatory referral to Psychology -     Sertraline HCl; Take two tablets once daily  Dispense: 60 tablet; Refill: 3  Muscle spasms of neck Assessment & Plan: Flexeril prn  Work on heat and neck exercise, sent to [facility].   Orders: -     Cyclobenzaprine HCl; Take 1/2 to one tablet po qhs prn muscle  Dispense: 30 tablet; Refill: 0  Type 2 diabetes mellitus with diabetic neuropathy, without long-term current use of insulin ([facility]) -     Hemoglobin A1c -     Microalbumin / creatinine urine ratio  Hyperlipidemia, unspecified hyperlipidemia type -     Lipid panel     Return in about 6 months (around 12/15/2022) for f/u diabetes.  Eugenia Pancoast, MSN, APRN, FNP-C [place]

## 2022-06-16 NOTE — Assessment & Plan Note (Signed)
Increase to sertraline to 200 mg  Work on anxiety reducing techniques  Referral placed for therapy

## 2022-06-16 NOTE — Assessment & Plan Note (Signed)
Continue atorvastatin 40 mg once daily Ordered lipid panel, pending results. Work on low cholesterol diet and exercise as tolerated

## 2022-06-17 ENCOUNTER — Other Ambulatory Visit: Payer: Self-pay | Admitting: Family

## 2022-06-17 DIAGNOSIS — E1141 Type 2 diabetes mellitus with diabetic mononeuropathy: Secondary | ICD-10-CM

## 2022-06-17 DIAGNOSIS — E1140 Type 2 diabetes mellitus with diabetic neuropathy, unspecified: Secondary | ICD-10-CM

## 2022-06-17 MED ORDER — TRULICITY 3 MG/0.5ML ~~LOC~~ SOAJ: 3.0000 mg | SUBCUTANEOUS | 5 refills | Status: DC

## 2022-06-23 ENCOUNTER — Ambulatory Visit: Payer: Commercial Managed Care - PPO | Admitting: Physician Assistant

## 2022-06-23 ENCOUNTER — Encounter: Payer: Self-pay | Admitting: Physician Assistant

## 2022-06-23 DIAGNOSIS — M25511 Pain in right shoulder: Secondary | ICD-10-CM

## 2022-06-23 DIAGNOSIS — M542 Cervicalgia: Secondary | ICD-10-CM | POA: Diagnosis not present

## 2022-06-23 DIAGNOSIS — M720 Palmar fascial fibromatosis [Dupuytren]: Secondary | ICD-10-CM | POA: Diagnosis not present

## 2022-06-23 DIAGNOSIS — G8929 Other chronic pain: Secondary | ICD-10-CM

## 2022-06-23 MED ORDER — LIDOCAINE HCL 1 % IJ SOLN
3.0000 mL | INTRAMUSCULAR | Status: AC | PRN
  Administered 2022-06-23: 3 mL

## 2022-06-23 MED ORDER — METHYLPREDNISOLONE ACETATE 40 MG/ML IJ SUSP
40.0000 mg | INTRAMUSCULAR | Status: AC | PRN
  Administered 2022-06-23: 40 mg via INTRA_ARTICULAR

## 2022-06-23 NOTE — Progress Notes (Signed)
Office Visit Note   Patient: Kimberly Solis Solis           Date of Birth: [birth year]           MRN: [id number] Visit Date: 06/23/2022              Requested by: Kimberly Solis Solis, [place],  [place] [address] PCP: Kimberly Pancoast, FNP   Assessment & Plan: Visit Diagnoses:  1. Dupuytren's contracture of hand   2. Cervicalgia   3. Chronic right shoulder pain     Plan: Will refer to Kimberly Solis Solis for the depression contracture of the right hand.  In regards to her shoulder and neck pain we will send her to formal therapy prescription given for benchmark therapy per her request this will include modalities range of motion strengthening shoulder and neck.  Follow-Up Instructions: No follow-ups on file.   Orders:  Orders Placed This Encounter  Procedures   Large Joint Inj   Ambulatory referral to Orthopedic Surgery   No orders of the defined types were placed in this encounter.     Procedures: Large Joint Inj: R subacromial bursa on 06/23/2022 5:35 PM Indications: pain Details: 22 G 1.5 in needle, superior approach  Arthrogram: No  Medications: 3 mL lidocaine 1 %; 40 mg methylPREDNISolone acetate 40 MG/ML Outcome: tolerated well, no immediate complications Procedure, treatment alternatives, risks and benefits explained, specific risks discussed. Consent was given by the patient. Immediately prior to procedure a time out was called to verify the correct patient, procedure, equipment, support staff and site/side marked as required. Patient was prepped and draped in the usual sterile fashion.       Clinical Data: No additional findings.   Subjective: Chief Complaint  Patient presents with   Right Shoulder - Pain    HPI HPI: Kimberly Solis Solis comes in today for right shoulder pain.  She last had cortisone injection in her right shoulder February of last year.  She did well until about 3 months ago.  No known injury.  She has had no numbness tingling down the  arm.  She does note tightness in her shoulder girdle but she has been dealing with caring for her husband who had a fall and injury to his head.  She is asking about another injection in the shoulder.  She is also asking about her right hand which she has a Dupuytren contracture she unfortunately was unable unable to see Kimberly Solis Solis is suggested due to insurance issues.  Review of Systems See HPI otherwise negative  Objective: Vital Signs: LMP  (LMP Unknown)   Physical Exam Constitutional:      Appearance: She is not ill-appearing or diaphoretic.  Pulmonary:     Effort: Pulmonary effort is normal.  Neurological:     Mental Status: She is alert.  Psychiatric:        Mood and Affect: Mood normal.     Ortho Exam Bilateral shoulders 5 out of 5 strength with internal rotation against resistance.  Slight weakness right shoulder with external rotation positive impingement on the right.  She is able to fully bring both arms fully overhead.  Liftoff test negative bilaterally.  Empty can test is negative bilaterally. Cervical spine good range of motion without pain.  Negative Spurling's.  Tenderness right trapezius region.  No tenderness over the medial border of the scapula bilaterally. Right hand she has an area that appears to be a Dupuytren's contracture near the fourth and fifth  metacarpal region distally. Specialty Comments:  No specialty comments available.  Imaging: No results found.   PMFS History: Patient Active Problem List   Diagnosis Date Noted   Muscle spasms of neck 06/16/2022   Seasonal allergic rhinitis due to pollen 03/14/2022   Microalbuminuria 02/10/2022   Hot flashes due to menopause 02/10/2022   Chronic fatigue 09/10/2021   Type 2 diabetes mellitus with diabetic neuropathy, without long-term current use of insulin ([place]) 05/20/2021   Normocytic anemia due to blood loss 09/17/2020   Glaucoma (increased eye pressure) 06/01/2020   Post menopausal syndrome 05/30/2019    Obesity (BMI 30-39.9) 01/25/2019   Diabetic neuropathy ([place]) 03/15/2015   Hyperlipidemia associated with type 2 diabetes mellitus ([place]) 03/15/2015   GAD (generalized anxiety disorder)    Crohn's colitis ([place])    Past Medical History:  Diagnosis Date   Anxiety    Diabetes mellitus without complication ([place])    Hyperlipidemia    Hypertension    Type II diabetes mellitus with neurological manifestations ([place]) 03/15/2015   Ulcerative colitis ([place]) 05/05/2005    Family History  Problem Relation Age of Onset   Irritable bowel syndrome Mother    Parkinson's disease Mother    Dementia Mother    Hypertension Father    Heart disease Father    Diabetes Mellitus II Father    Breast cancer Sister 86   Parkinson's disease Maternal Grandfather    Heart disease Maternal Grandfather    Diabetes Mellitus II Maternal Grandfather     Past Surgical History:  Procedure Laterality Date   CERVICAL BIOPSY     May 2022   DILATION AND CURETTAGE OF UTERUS     ENDOMETRIAL BIOPSY     05/2020   Social History   Occupational History   Not on file  Tobacco Use   Smoking status: Former    Packs/day: 0.75    Years: 10.00    Total pack years: 7.50    Types: Cigarettes    Quit date: 04/20/1999    Years since quitting: 23.1   Smokeless tobacco: Never  Vaping Use   Vaping Use: Not on file  Substance and Sexual Activity   Alcohol use: No   Drug use: No   Sexual activity: Not on file

## 2022-06-30 ENCOUNTER — Encounter: Payer: Self-pay | Admitting: Family

## 2022-07-10 ENCOUNTER — Encounter: Payer: Self-pay | Admitting: Radiology

## 2022-07-15 ENCOUNTER — Encounter: Payer: Self-pay | Admitting: Family

## 2022-08-03 ENCOUNTER — Other Ambulatory Visit: Payer: Self-pay | Admitting: Family

## 2022-08-03 DIAGNOSIS — E1140 Type 2 diabetes mellitus with diabetic neuropathy, unspecified: Secondary | ICD-10-CM

## 2022-08-07 ENCOUNTER — Ambulatory Visit: Payer: Commercial Managed Care - PPO | Admitting: Physician Assistant

## 2022-08-09 ENCOUNTER — Other Ambulatory Visit: Payer: Self-pay | Admitting: Family

## 2022-08-09 DIAGNOSIS — R809 Proteinuria, unspecified: Secondary | ICD-10-CM

## 2022-08-21 ENCOUNTER — Other Ambulatory Visit: Payer: Self-pay | Admitting: Obstetrics and Gynecology

## 2022-08-21 DIAGNOSIS — R928 Other abnormal and inconclusive findings on diagnostic imaging of breast: Secondary | ICD-10-CM

## 2022-09-04 ENCOUNTER — Other Ambulatory Visit (HOSPITAL_COMMUNITY): Payer: Self-pay

## 2022-09-04 ENCOUNTER — Telehealth: Payer: Self-pay

## 2022-09-04 NOTE — Telephone Encounter (Signed)
PA request received via CMM for Trulicity 3MG /0.5ML pen-injectors  PA has been submitted to [facility] and has been APPROVED from 09/04/2022-02/20/2023  Key: [id number]

## 2022-09-08 ENCOUNTER — Ambulatory Visit
Admission: RE | Admit: 2022-09-08 | Discharge: 2022-09-08 | Disposition: A | Discharge status: Home / Self Care | Payer: Commercial Managed Care - PPO | Source: Ambulatory Visit | Attending: Obstetrics and Gynecology | Admitting: Obstetrics and Gynecology

## 2022-09-08 DIAGNOSIS — R928 Other abnormal and inconclusive findings on diagnostic imaging of breast: Secondary | ICD-10-CM

## 2022-09-10 ENCOUNTER — Other Ambulatory Visit: Payer: Self-pay | Admitting: Family

## 2022-09-10 DIAGNOSIS — E1169 Type 2 diabetes mellitus with other specified complication: Secondary | ICD-10-CM

## 2022-10-01 ENCOUNTER — Encounter: Payer: Self-pay | Admitting: Family

## 2022-10-01 DIAGNOSIS — E1140 Type 2 diabetes mellitus with diabetic neuropathy, unspecified: Secondary | ICD-10-CM

## 2022-10-06 MED ORDER — METFORMIN HCL ER 500 MG PO TB24
500.0000 mg | ORAL_TABLET | Freq: Two times a day (BID) | ORAL | 3 refills | Status: AC
Start: 2022-10-06 — End: 2023-10-01

## 2022-11-05 ENCOUNTER — Encounter: Payer: Self-pay | Admitting: Family

## 2022-11-05 ENCOUNTER — Ambulatory Visit: Payer: Commercial Managed Care - PPO | Admitting: Family

## 2022-11-05 VITALS — BP 130/74 | HR 90 | Temp 97.6°F | Ht 65.25 in | Wt 206.0 lb

## 2022-11-05 DIAGNOSIS — R809 Proteinuria, unspecified: Secondary | ICD-10-CM

## 2022-11-05 DIAGNOSIS — E1140 Type 2 diabetes mellitus with diabetic neuropathy, unspecified: Secondary | ICD-10-CM | POA: Diagnosis not present

## 2022-11-05 DIAGNOSIS — E1169 Type 2 diabetes mellitus with other specified complication: Secondary | ICD-10-CM | POA: Diagnosis not present

## 2022-11-05 DIAGNOSIS — F411 Generalized anxiety disorder: Secondary | ICD-10-CM

## 2022-11-05 DIAGNOSIS — Z7984 Long term (current) use of oral hypoglycemic drugs: Secondary | ICD-10-CM

## 2022-11-05 DIAGNOSIS — E785 Hyperlipidemia, unspecified: Secondary | ICD-10-CM

## 2022-11-05 LAB — POCT GLYCOSYLATED HEMOGLOBIN (HGB A1C): Hemoglobin A1C: 8.6 % — AB (ref 4.0–5.6)

## 2022-11-05 MED ORDER — SERTRALINE HCL 100 MG PO TABS
ORAL_TABLET | ORAL | 3 refills | Status: DC
Start: 2022-11-05 — End: 2024-01-15

## 2022-11-05 MED ORDER — LANCET DEVICE MISC
1.0000 | Freq: Three times a day (TID) | 0 refills | Status: AC
Start: 2022-11-05 — End: 2022-12-05

## 2022-11-05 MED ORDER — BLOOD GLUCOSE SYSTEM PAK KIT
PACK | 1 refills | Status: AC
Start: 2022-11-05 — End: ?

## 2022-11-05 MED ORDER — LINAGLIPTIN 5 MG PO TABS
5.0000 mg | ORAL_TABLET | Freq: Every day | ORAL | 3 refills | Status: AC
Start: 2022-11-05 — End: ?

## 2022-11-05 MED ORDER — GLIPIZIDE 5 MG PO TABS
5.0000 mg | ORAL_TABLET | Freq: Two times a day (BID) | ORAL | 3 refills | Status: DC
Start: 2022-11-05 — End: 2023-01-21

## 2022-11-05 MED ORDER — BLOOD GLUCOSE MONITORING SUPPL DEVI
1.0000 | Freq: Three times a day (TID) | 0 refills | Status: DC
Start: 2022-11-05 — End: 2023-08-07

## 2022-11-05 MED ORDER — ALPRAZOLAM 0.5 MG PO TABS
0.5000 mg | ORAL_TABLET | Freq: Every evening | ORAL | 5 refills | Status: DC | PRN
Start: 2022-11-05 — End: 2023-07-15

## 2022-11-05 MED ORDER — BLOOD GLUCOSE TEST VI STRP
1.0000 | ORAL_STRIP | Freq: Three times a day (TID) | 0 refills | Status: DC
Start: 2022-11-05 — End: 2022-12-10

## 2022-11-05 NOTE — Assessment & Plan Note (Addendum)
Start glipizide 5 mg once daily Start tradjenta 5 mg once daily  Continue metformin 500 mg once daily  + urine microalbumin cont losartan 50 mg once daily  Ordered hga1c today , worse. Work on diabetic diet and exercise as tolerated. Yearly foot exam, and annual eye exam.

## 2022-11-05 NOTE — Progress Notes (Signed)
Established Patient Office Visit  Subjective:      CC:  Chief Complaint  Patient presents with   Diabetes    HPI: Kimberly Solis is a 62 y.o. female presenting on 11/05/2022 for Diabetes . GAD: increased sertraline to 200 mg last visit. Referral placed for therapy as well. She states with the dose increase this is improved.   DM2: on metformin 500 mg twice daily. Was on Trulicity but caused her Crohns to worsen. Did tolerate ozempic but that was not available. She has been watching her diet more, and has lost five pounds.   Lab Results  Component Value Date   HGBA1C 8.6 (A) 11/05/2022   Lab Results  Component Value Date   HGBA1C 8.6 (A) 11/05/2022   HLD: on atorvastatin 40 mg once daily. Tolerating well. Also on COQ10 .       Social history:  Relevant past medical, surgical, family and social history reviewed and updated as indicated. Interim medical history since our last visit reviewed.  Allergies and medications reviewed and updated.  DATA REVIEWED: CHART IN EPIC     ROS: Negative unless specifically indicated above in HPI.    Current Outpatient Medications:    Adalimumab (HUMIRA) 40 MG/0.8ML PSKT, Inject into the skin. QOW, Disp: , Rfl:    atorvastatin (LIPITOR) 40 MG tablet, TAKE 1 TABLET BY MOUTH EVERY DAY, Disp: 90 tablet, Rfl: 3   Barberry-Oreg Grape-Goldenseal (BERBERINE COMPLEX) 200-200-50 MG CAPS, Take by mouth., Disp: , Rfl:    Coenzyme Q10 (CO Q-10 PO), Take by mouth., Disp: , Rfl:    cyclobenzaprine (FLEXERIL) 10 MG tablet, Take 1/2 to one tablet po qhs prn muscle, Disp: 30 tablet, Rfl: 0   ferrous sulfate 325 (65 FE) MG tablet, Take 325 mg by mouth daily with breakfast., Disp: , Rfl:    fluticasone (FLONASE) 50 MCG/ACT nasal spray, Place 2 sprays into both nostrils daily., Disp: , Rfl:    hyoscyamine (LEVSIN) 0.125 MG tablet, , Disp: , Rfl:    losartan (COZAAR) 25 MG tablet, START 1/2 TABLET ONCE DAILY, Disp: 45 tablet, Rfl: 1   Magnesium  300 MG CAPS, Take 300 mg by mouth daily., Disp: , Rfl:    metFORMIN (GLUCOPHAGE-XR) 500 MG 24 hr tablet, Take 1 tablet (500 mg total) by mouth 2 (two) times daily with a meal., Disp: 180 tablet, Rfl: 3   Multiple Vitamin (MULTIVITAMIN) tablet, Take 1 tablet by mouth daily. [facility] Women's +50, Disp: , Rfl:    OneTouch Delica Lancets 30G MISC, USE AS DIRECTED TO MONITOR FSBS 2X DAILY, Disp: 100 each, Rfl: 1   Turmeric (QC TUMERIC COMPLEX) 500 MG CAPS, Tumeric, Disp: , Rfl:    ALPRAZolam (XANAX) 0.5 MG tablet, Take 1 tablet (0.5 mg total) by mouth at bedtime as needed for sleep., Disp: 30 tablet, Rfl: 5   Blood Glucose Monitoring Suppl (BLOOD GLUCOSE SYSTEM PAK) KIT, Please dispense based on patient and insurance preference. Use as directed to monitor FSBS 2x daily. Dx: E11.9, Disp: 1 kit, Rfl: 1   Blood Glucose Monitoring Suppl DEVI, 1 each by Does not apply route in the morning, at noon, and at bedtime. May substitute to any manufacturer covered by patient's insurance., Disp: 1 each, Rfl: 0   glipiZIDE (GLUCOTROL) 5 MG tablet, Take 1 tablet (5 mg total) by mouth 2 (two) times daily before a meal., Disp: 60 tablet, Rfl: 3   Glucose Blood (BLOOD GLUCOSE TEST STRIPS) STRP, 1 each by In Vitro route in  the morning, at noon, and at bedtime. May substitute to any manufacturer covered by patient's insurance., Disp: 90 each, Rfl: 0   Lancet Device MISC, 1 each by Does not apply route in the morning, at noon, and at bedtime. May substitute to any manufacturer covered by patient's insurance., Disp: 1 each, Rfl: 0   linagliptin (TRADJENTA) 5 MG TABS tablet, Take 1 tablet (5 mg total) by mouth daily., Disp: 90 tablet, Rfl: 3   progesterone (PROMETRIUM) 100 MG capsule, Take 1 tablet by mouth daily., Disp: , Rfl:    sertraline (ZOLOFT) 100 MG tablet, Take two tablets once daily, Disp: 180 tablet, Rfl: 3      Objective:    BP 130/74   Pulse 90   Temp 97.6 F (36.4 C) (Temporal)   Ht 5' 5.25" (1.657 m)   Wt 206 lb  (93.4 kg)   LMP  (LMP Unknown)   SpO2 97%   BMI 34.02 kg/m   Wt Readings from Last 3 Encounters:  11/05/22 206 lb (93.4 kg)  06/16/22 211 lb (95.7 kg)  03/13/22 207 lb 8 oz (94.1 kg)    Physical Exam Constitutional:      General: She is not in acute distress.    Appearance: Normal appearance. She is normal weight. She is not ill-appearing, toxic-appearing or diaphoretic.  HENT:     Head: Normocephalic.  Cardiovascular:     Rate and Rhythm: Normal rate and regular rhythm.  Pulmonary:     Effort: Pulmonary effort is normal.     Breath sounds: Normal breath sounds.  Musculoskeletal:        General: Normal range of motion.  Neurological:     General: No focal deficit present.     Mental Status: She is alert and oriented to person, place, and time. Mental status is at baseline.  Psychiatric:        Mood and Affect: Mood normal.        Behavior: Behavior normal.        Thought Content: Thought content normal.        Judgment: Judgment normal.         Diabetic Foot Form - Detailed   Diabetic Foot Exam - detailed Can the patient see the bottom of their feet?: Yes Are the shoes appropriate in style and fit?: Yes Is there swelling or and abnormal foot shape?: No Is there a claw toe deformity?: No Is there elevated skin temparature?: No Is there foot or ankle muscle weakness?: No Normal Range of Motion: Yes Pulse Foot Exam completed.: Yes   Right posterior Tibialias: Present Left posterior Tibialias: Present   Right Dorsalis Pedis: Present Left Dorsalis Pedis: Present  Sensory Foot Exam Completed.: Yes Semmes-Weinstein Monofilament Test R Site 1-Great Toe: Pos L Site 1-Great Toe: Pos            Assessment & Plan:  Type 2 diabetes mellitus with diabetic neuropathy, without long-term current use of insulin (HCC) Assessment & Plan: Start glipizide 5 mg once daily Start tradjenta 5 mg once daily  Continue metformin 500 mg once daily  + urine microalbumin cont  losartan 50 mg once daily  Ordered hga1c today , worse. Work on diabetic diet and exercise as tolerated. Yearly foot exam, and annual eye exam.    Orders: -     POCT glycosylated hemoglobin (Hb A1C) -     Blood Glucose Monitoring Suppl; 1 each by Does not apply route in the morning, at noon, and at bedtime.  May substitute to any manufacturer covered by patient's insurance.  Dispense: 1 each; Refill: 0 -     Blood Glucose Test; 1 each by In Vitro route in the morning, at noon, and at bedtime. May substitute to any manufacturer covered by patient's insurance.  Dispense: 90 each; Refill: 0 -     Lancet Device; 1 each by Does not apply route in the morning, at noon, and at bedtime. May substitute to any manufacturer covered by patient's insurance.  Dispense: 1 each; Refill: 0 -     Blood Glucose System Pak; Please dispense based on patient and insurance preference. Use as directed to monitor FSBS 2x daily. Dx: E11.9  Dispense: 1 kit; Refill: 1 -     glipiZIDE; Take 1 tablet (5 mg total) by mouth 2 (two) times daily before a meal.  Dispense: 60 tablet; Refill: 3 -     linaGLIPtin; Take 1 tablet (5 mg total) by mouth daily.  Dispense: 90 tablet; Refill: 3  Microalbuminuria  Hyperlipidemia associated with type 2 diabetes mellitus (HCC) Assessment & Plan: Continue atorvastatin 10 mg nightly     GAD (generalized anxiety disorder) Assessment & Plan: Improved. Cont sertraline 200 mg once daily   Orders: -     ALPRAZolam; Take 1 tablet (0.5 mg total) by mouth at bedtime as needed for sleep.  Dispense: 30 tablet; Refill: 5 -     Sertraline HCl; Take two tablets once daily  Dispense: 180 tablet; Refill: 3     Return in about 3 months (around 02/05/2023) for f/u diabetes.  Mort Sawyers, MSN, APRN, FNP-C [facility]

## 2022-11-05 NOTE — Patient Instructions (Addendum)
  Make sure you are no longer taking lisinopril but taking losartan.   We are going ot start glipizide 5 mg once daily and also tradjenta 5 mg once daily.   Regards,   Mort Sawyers FNP-C

## 2022-11-05 NOTE — Assessment & Plan Note (Signed)
Improved. Cont sertraline 200 mg once daily

## 2022-11-05 NOTE — Assessment & Plan Note (Signed)
-  Continue atorvastatin 10 mg nightly

## 2022-12-10 ENCOUNTER — Other Ambulatory Visit: Payer: Self-pay | Admitting: Family

## 2022-12-10 DIAGNOSIS — E1140 Type 2 diabetes mellitus with diabetic neuropathy, unspecified: Secondary | ICD-10-CM

## 2023-01-02 ENCOUNTER — Other Ambulatory Visit: Payer: Self-pay | Admitting: Family

## 2023-01-02 DIAGNOSIS — R809 Proteinuria, unspecified: Secondary | ICD-10-CM

## 2023-01-21 ENCOUNTER — Other Ambulatory Visit: Payer: Self-pay | Admitting: Family

## 2023-01-21 DIAGNOSIS — E1140 Type 2 diabetes mellitus with diabetic neuropathy, unspecified: Secondary | ICD-10-CM

## 2023-02-05 ENCOUNTER — Encounter: Payer: Self-pay | Admitting: Family

## 2023-02-05 ENCOUNTER — Ambulatory Visit (INDEPENDENT_AMBULATORY_CARE_PROVIDER_SITE_OTHER): Payer: BC Managed Care – PPO | Admitting: Family

## 2023-02-05 VITALS — BP 130/82 | HR 94 | Temp 97.5°F | Ht 65.25 in | Wt 207.0 lb

## 2023-02-05 DIAGNOSIS — E1141 Type 2 diabetes mellitus with diabetic mononeuropathy: Secondary | ICD-10-CM | POA: Diagnosis not present

## 2023-02-05 DIAGNOSIS — E1140 Type 2 diabetes mellitus with diabetic neuropathy, unspecified: Secondary | ICD-10-CM

## 2023-02-05 DIAGNOSIS — K5010 Crohn's disease of large intestine without complications: Secondary | ICD-10-CM | POA: Diagnosis not present

## 2023-02-05 DIAGNOSIS — Z7984 Long term (current) use of oral hypoglycemic drugs: Secondary | ICD-10-CM

## 2023-02-05 DIAGNOSIS — K648 Other hemorrhoids: Secondary | ICD-10-CM | POA: Diagnosis not present

## 2023-02-05 LAB — POCT GLYCOSYLATED HEMOGLOBIN (HGB A1C): Hemoglobin A1C: 6.6 % — AB (ref 4.0–5.6)

## 2023-02-05 MED ORDER — METFORMIN HCL ER 500 MG PO TB24
500.0000 mg | ORAL_TABLET | Freq: Every day | ORAL | 3 refills | Status: AC
Start: 2023-02-05 — End: ?

## 2023-02-05 MED ORDER — HYDROCORTISONE (PERIANAL) 2.5 % EX CREA
1.0000 | TOPICAL_CREAM | Freq: Two times a day (BID) | CUTANEOUS | 0 refills | Status: AC
Start: 2023-02-05 — End: ?

## 2023-02-05 MED ORDER — LINAGLIPTIN 5 MG PO TABS
5.0000 mg | ORAL_TABLET | Freq: Every day | ORAL | 1 refills | Status: DC
Start: 2023-02-05 — End: 2023-02-05

## 2023-02-05 NOTE — Assessment & Plan Note (Signed)
Ordered hga1c today with great improvement, restart metformin 500 mg twice daily continue glipizide 5 mg once daily. Work on diabetic diet and exercise as tolerated. Yearly foot exam, and annual eye exam.

## 2023-02-05 NOTE — Assessment & Plan Note (Signed)
Pt improving with constipation as of recent, however pt still with some residual abd pain.  Pt to contact GI to follow up, awaiting prior auth for a new medication for her record.

## 2023-02-05 NOTE — Progress Notes (Signed)
Established Patient Office Visit  Subjective:      CC:  Chief Complaint  Patient presents with   Medical Management of Chronic Issues    HPI: Kimberly Solis is a 62 y.o. female presenting on 02/05/2023 for Medical Management of Chronic Issues . Over the last week dealt  with some constipation, she is currently in a crohn flare. She states she was having such incredible rectal pressure and pain from the constipation and only releasing small amounts of bowel.   DM2: has been tracking her sugars, average fasting is between 110 to 156, highest was when she was constipated and couldn't eat and it 'shot up higher' Is taking glipizide 5 mg twice daily.  Did order tradjenta but pt states she did not hear anything further to get started. She stopped metformin because she thought she was supposed to, she does not report any issues or side effects with metformin was tolerating well.   Lab Results  Component Value Date   HGBA1C 6.6 (A) 02/05/2023        Social history:  Relevant past medical, surgical, family and social history reviewed and updated as indicated. Interim medical history since our last visit reviewed.  Allergies and medications reviewed and updated.  DATA REVIEWED: CHART IN EPIC     ROS: Negative unless specifically indicated above in HPI.    Current Outpatient Medications:    Adalimumab (HUMIRA) 40 MG/0.8ML PSKT, Inject into the skin. QOW, Disp: , Rfl:    ALPRAZolam (XANAX) 0.5 MG tablet, Take 1 tablet (0.5 mg total) by mouth at bedtime as needed for sleep., Disp: 30 tablet, Rfl: 5   atorvastatin (LIPITOR) 40 MG tablet, TAKE 1 TABLET BY MOUTH EVERY DAY, Disp: 90 tablet, Rfl: 3   Barberry-Oreg Grape-Goldenseal (BERBERINE COMPLEX) 200-200-50 MG CAPS, Take by mouth., Disp: , Rfl:    Blood Glucose Monitoring Suppl (BLOOD GLUCOSE SYSTEM PAK) KIT, Please dispense based on patient and insurance preference. Use as directed to monitor FSBS 2x daily. Dx: E11.9,  Disp: 1 kit, Rfl: 1   Blood Glucose Monitoring Suppl DEVI, 1 each by Does not apply route in the morning, at noon, and at bedtime. May substitute to any manufacturer covered by patient's insurance., Disp: 1 each, Rfl: 0   Coenzyme Q10 (CO Q-10 PO), Take by mouth., Disp: , Rfl:    cyclobenzaprine (FLEXERIL) 10 MG tablet, Take 1/2 to one tablet po qhs prn muscle, Disp: 30 tablet, Rfl: 0   ferrous sulfate 325 (65 FE) MG tablet, Take 325 mg by mouth daily with breakfast., Disp: , Rfl:    fluticasone (FLONASE) 50 MCG/ACT nasal spray, Place 2 sprays into both nostrils daily., Disp: , Rfl:    glipiZIDE (GLUCOTROL) 5 MG tablet, TAKE 1 TABLET (5 MG TOTAL) BY MOUTH TWICE A DAY BEFORE MEALS, Disp: 180 tablet, Rfl: 1   glucose blood (ACCU-CHEK GUIDE) test strip, USE STRIP IN THE MORNING AT NOON AND AT BEDTIME, Disp: 100 strip, Rfl: 2   hydrocortisone (ANUSOL-HC) 2.5 % rectal cream, Place 1 Application rectally 2 (two) times daily., Disp: 30 g, Rfl: 0   hyoscyamine (LEVSIN) 0.125 MG tablet, , Disp: , Rfl:    losartan (COZAAR) 25 MG tablet, START 1/2 TABLET BY MOUTH ONCE DAILY, Disp: 45 tablet, Rfl: 1   Magnesium 300 MG CAPS, Take 300 mg by mouth daily., Disp: , Rfl:    metFORMIN (GLUCOPHAGE-XR) 500 MG 24 hr tablet, Take 1 tablet (500 mg total) by mouth daily with breakfast., Disp: 90  tablet, Rfl: 3   Multiple Vitamin (MULTIVITAMIN) tablet, Take 1 tablet by mouth daily. [facility] Women's +50, Disp: , Rfl:    OneTouch Delica Lancets 30G MISC, USE AS DIRECTED TO MONITOR FSBS 2X DAILY, Disp: 100 each, Rfl: 1   progesterone (PROMETRIUM) 100 MG capsule, Take 1 tablet by mouth daily., Disp: , Rfl:    sertraline (ZOLOFT) 100 MG tablet, Take two tablets once daily, Disp: 180 tablet, Rfl: 3   Turmeric (QC TUMERIC COMPLEX) 500 MG CAPS, Tumeric, Disp: , Rfl:       Objective:    BP 130/82 (BP Location: Left Arm, Patient Position: Sitting, Cuff Size: Normal)   Pulse 94   Temp (!) 97.5 F (36.4 C) (Temporal)   Ht 5' 5.25"  (1.657 m)   Wt 207 lb (93.9 kg)   LMP  (LMP Unknown)   SpO2 100%   BMI 34.18 kg/m   Wt Readings from Last 3 Encounters:  02/05/23 207 lb (93.9 kg)  11/05/22 206 lb (93.4 kg)  06/16/22 211 lb (95.7 kg)    Physical Exam Constitutional:      General: She is not in acute distress.    Appearance: Normal appearance. She is normal weight. She is not ill-appearing, toxic-appearing or diaphoretic.  HENT:     Head: Normocephalic.  Cardiovascular:     Rate and Rhythm: Normal rate.  Pulmonary:     Effort: Pulmonary effort is normal.  Abdominal:     General: Bowel sounds are decreased.     Tenderness: There is abdominal tenderness in the right upper quadrant and left lower quadrant. Negative signs include Murphy's sign and McBurney's sign.     Comments: Mild left lower  Pressure per pt on palpation of ruq   Musculoskeletal:        General: Normal range of motion.  Neurological:     General: No focal deficit present.     Mental Status: She is alert and oriented to person, place, and time. Mental status is at baseline.  Psychiatric:        Mood and Affect: Mood normal.        Behavior: Behavior normal.        Thought Content: Thought content normal.        Judgment: Judgment normal.           Assessment & Plan:  Other hemorrhoids Assessment & Plan: Rx anusol cream Sitz bath as needed Try to avoid straining   Orders: -     Hydrocortisone (Perianal); Place 1 Application rectally 2 (two) times daily.  Dispense: 30 g; Refill: 0  Diabetic mononeuropathy associated with type 2 diabetes mellitus (HCC) -     POCT glycosylated hemoglobin (Hb A1C) -     metFORMIN HCl ER; Take 1 tablet (500 mg total) by mouth daily with breakfast.  Dispense: 90 tablet; Refill: 3  Type 2 diabetes mellitus with diabetic neuropathy, without long-term current use of insulin (HCC) Assessment & Plan: Ordered hga1c today with great improvement, restart metformin 500 mg twice daily continue glipizide 5 mg  once daily. Work on diabetic diet and exercise as tolerated. Yearly foot exam, and annual eye exam.    Orders: -     POCT glycosylated hemoglobin (Hb A1C) -     metFORMIN HCl ER; Take 1 tablet (500 mg total) by mouth daily with breakfast.  Dispense: 90 tablet; Refill: 3  Crohn's disease of colon without complication (HCC) Assessment & Plan: Pt improving with constipation as of recent, however  pt still with some residual abd pain.  Pt to contact GI to follow up, awaiting prior auth for a new medication for her record.        Return in about 6 months (around 08/06/2023) for f/u diabetes.  Mort Sawyers, MSN, APRN, FNP-C [facility]

## 2023-02-05 NOTE — Assessment & Plan Note (Signed)
Rx anusol cream Sitz bath as needed Try to avoid straining

## 2023-02-12 DIAGNOSIS — R194 Change in bowel habit: Secondary | ICD-10-CM | POA: Diagnosis not present

## 2023-02-12 DIAGNOSIS — K625 Hemorrhage of anus and rectum: Secondary | ICD-10-CM | POA: Diagnosis not present

## 2023-02-12 DIAGNOSIS — K5730 Diverticulosis of large intestine without perforation or abscess without bleeding: Secondary | ICD-10-CM | POA: Diagnosis not present

## 2023-02-12 DIAGNOSIS — K5010 Crohn's disease of large intestine without complications: Secondary | ICD-10-CM | POA: Diagnosis not present

## 2023-02-16 ENCOUNTER — Encounter: Payer: Self-pay | Admitting: Gastroenterology

## 2023-02-16 DIAGNOSIS — K5730 Diverticulosis of large intestine without perforation or abscess without bleeding: Secondary | ICD-10-CM | POA: Diagnosis not present

## 2023-02-16 DIAGNOSIS — Z1211 Encounter for screening for malignant neoplasm of colon: Secondary | ICD-10-CM | POA: Diagnosis not present

## 2023-02-16 DIAGNOSIS — K5010 Crohn's disease of large intestine without complications: Secondary | ICD-10-CM | POA: Diagnosis not present

## 2023-02-16 DIAGNOSIS — K529 Noninfective gastroenteritis and colitis, unspecified: Secondary | ICD-10-CM | POA: Diagnosis not present

## 2023-02-16 DIAGNOSIS — K625 Hemorrhage of anus and rectum: Secondary | ICD-10-CM | POA: Diagnosis not present

## 2023-02-16 DIAGNOSIS — R195 Other fecal abnormalities: Secondary | ICD-10-CM | POA: Diagnosis not present

## 2023-02-16 LAB — HM COLONOSCOPY

## 2023-02-16 NOTE — Progress Notes (Signed)
noted 

## 2023-02-19 NOTE — Progress Notes (Signed)
noted 

## 2023-03-09 DIAGNOSIS — K5010 Crohn's disease of large intestine without complications: Secondary | ICD-10-CM | POA: Diagnosis not present

## 2023-04-22 DIAGNOSIS — E119 Type 2 diabetes mellitus without complications: Secondary | ICD-10-CM | POA: Diagnosis not present

## 2023-04-22 DIAGNOSIS — H35033 Hypertensive retinopathy, bilateral: Secondary | ICD-10-CM | POA: Diagnosis not present

## 2023-04-22 DIAGNOSIS — H04123 Dry eye syndrome of bilateral lacrimal glands: Secondary | ICD-10-CM | POA: Diagnosis not present

## 2023-04-22 LAB — HM DIABETES EYE EXAM

## 2023-04-23 NOTE — Progress Notes (Signed)
noted 

## 2023-07-14 ENCOUNTER — Other Ambulatory Visit: Payer: Self-pay | Admitting: Family

## 2023-07-14 DIAGNOSIS — R635 Abnormal weight gain: Secondary | ICD-10-CM | POA: Diagnosis not present

## 2023-07-14 DIAGNOSIS — K5010 Crohn's disease of large intestine without complications: Secondary | ICD-10-CM | POA: Diagnosis not present

## 2023-07-14 DIAGNOSIS — E119 Type 2 diabetes mellitus without complications: Secondary | ICD-10-CM | POA: Diagnosis not present

## 2023-07-14 DIAGNOSIS — F419 Anxiety disorder, unspecified: Secondary | ICD-10-CM | POA: Diagnosis not present

## 2023-07-14 DIAGNOSIS — G4700 Insomnia, unspecified: Secondary | ICD-10-CM | POA: Diagnosis not present

## 2023-07-14 DIAGNOSIS — F411 Generalized anxiety disorder: Secondary | ICD-10-CM

## 2023-07-14 DIAGNOSIS — K5730 Diverticulosis of large intestine without perforation or abscess without bleeding: Secondary | ICD-10-CM | POA: Diagnosis not present

## 2023-07-14 NOTE — Telephone Encounter (Signed)
 Last OV: 02/05/23 Pending OV: 08/06/23 Medication: Alprazolam 0.5mg  Directions: Take one tablet at bedtime Last Refill: 11/05/2022 Qty: #30 with 0 refills

## 2023-07-28 ENCOUNTER — Other Ambulatory Visit: Payer: Self-pay | Admitting: Family

## 2023-07-28 DIAGNOSIS — E1140 Type 2 diabetes mellitus with diabetic neuropathy, unspecified: Secondary | ICD-10-CM

## 2023-08-06 ENCOUNTER — Ambulatory Visit: Payer: BC Managed Care – PPO | Admitting: Family

## 2023-08-07 ENCOUNTER — Encounter: Payer: Self-pay | Admitting: Family

## 2023-08-07 ENCOUNTER — Ambulatory Visit (INDEPENDENT_AMBULATORY_CARE_PROVIDER_SITE_OTHER): Admitting: Family

## 2023-08-07 VITALS — Ht 65.25 in | Wt 230.4 lb

## 2023-08-07 DIAGNOSIS — K5010 Crohn's disease of large intestine without complications: Secondary | ICD-10-CM | POA: Diagnosis not present

## 2023-08-07 DIAGNOSIS — E1169 Type 2 diabetes mellitus with other specified complication: Secondary | ICD-10-CM | POA: Diagnosis not present

## 2023-08-07 DIAGNOSIS — R635 Abnormal weight gain: Secondary | ICD-10-CM | POA: Diagnosis not present

## 2023-08-07 DIAGNOSIS — L700 Acne vulgaris: Secondary | ICD-10-CM

## 2023-08-07 DIAGNOSIS — M2550 Pain in unspecified joint: Secondary | ICD-10-CM | POA: Diagnosis not present

## 2023-08-07 DIAGNOSIS — E1140 Type 2 diabetes mellitus with diabetic neuropathy, unspecified: Secondary | ICD-10-CM | POA: Diagnosis not present

## 2023-08-07 DIAGNOSIS — R809 Proteinuria, unspecified: Secondary | ICD-10-CM

## 2023-08-07 DIAGNOSIS — F19982 Other psychoactive substance use, unspecified with psychoactive substance-induced sleep disorder: Secondary | ICD-10-CM

## 2023-08-07 DIAGNOSIS — Z7984 Long term (current) use of oral hypoglycemic drugs: Secondary | ICD-10-CM | POA: Diagnosis not present

## 2023-08-07 DIAGNOSIS — E785 Hyperlipidemia, unspecified: Secondary | ICD-10-CM

## 2023-08-07 LAB — MICROALBUMIN / CREATININE URINE RATIO
Creatinine,U: 148.3 mg/dL
Microalb Creat Ratio: 9.3 mg/g (ref 0.0–30.0)
Microalb, Ur: 1.4 mg/dL (ref 0.0–1.9)

## 2023-08-07 LAB — COMPREHENSIVE METABOLIC PANEL WITH GFR
ALT: 20 U/L (ref 0–35)
AST: 22 U/L (ref 0–37)
Albumin: 5 g/dL (ref 3.5–5.2)
Alkaline Phosphatase: 70 U/L (ref 39–117)
BUN: 15 mg/dL (ref 6–23)
CO2: 28 meq/L (ref 19–32)
Calcium: 10.1 mg/dL (ref 8.4–10.5)
Chloride: 102 meq/L (ref 96–112)
Creatinine, Ser: 0.83 mg/dL (ref 0.40–1.20)
GFR: 75.78 mL/min (ref >60.00)
Glucose, Bld: 189 mg/dL — ABNORMAL HIGH (ref 70–99)
Potassium: 5.1 meq/L (ref 3.5–5.1)
Sodium: 141 meq/L (ref 135–145)
Total Bilirubin: 0.4 mg/dL (ref 0.2–1.2)
Total Protein: 7.6 g/dL (ref 6.0–8.3)

## 2023-08-07 LAB — T4, FREE: Free T4: 0.74 ng/dL (ref 0.60–1.60)

## 2023-08-07 LAB — TSH: TSH: 1.09 u[IU]/mL (ref 0.35–5.50)

## 2023-08-07 MED ORDER — RINVOQ 30 MG PO TB24
30.0000 mg | ORAL_TABLET | Freq: Every day | ORAL | Status: AC
Start: 2023-08-07 — End: ?

## 2023-08-07 NOTE — Assessment & Plan Note (Signed)
 Will repeat again today pending results.

## 2023-08-07 NOTE — Progress Notes (Signed)
 Established Patient Office Visit  Subjective:      CC:  Chief Complaint  Patient presents with   Medical Management of Chronic Issues    HPI: Kimberly Solis is a 63 y.o. female presenting on 08/07/2023 for Medical Management of Chronic Issues  Crohns, has started on rinvoq and doing well from a crohns perspective, and c/o weight gain and difficulty sleeping.  She also has noted new acne on her face and posterior neck.   DM2: she is taking metformin 500 mg and glipizide 5 mg  Lab Results  Component Value Date   HGBA1C 6.6 (A) 02/05/2023   Joint pains, bil hands with stiffness difficulty trying to open any cans or anything. Also joint pains bil hips and lower back.   HLD: on atorvastatin 40 mg nightly doing well and tolerating well.  Lab Results  Component Value Date   CHOL 183 06/16/2022   HDL 51.10 06/16/2022   LDLCALC 109 (H) 06/16/2022   TRIG 113.0 06/16/2022   CHOLHDL 4 06/16/2022           Social history:  Relevant past medical, surgical, family and social history reviewed and updated as indicated. Interim medical history since our last visit reviewed.  Allergies and medications reviewed and updated.  DATA REVIEWED: CHART IN EPIC     ROS: Negative unless specifically indicated above in HPI.    Current Outpatient Medications:    ALPRAZolam (XANAX) 0.5 MG tablet, TAKE 1 TABLET (0.5 MG TOTAL) BY MOUTH AT BEDTIME AS NEEDED FOR SLEEP., Disp: 30 tablet, Rfl: 2   atorvastatin (LIPITOR) 40 MG tablet, TAKE 1 TABLET BY MOUTH EVERY DAY, Disp: 90 tablet, Rfl: 3   Blood Glucose Monitoring Suppl (BLOOD GLUCOSE SYSTEM PAK) KIT, Please dispense based on patient and insurance preference. Use as directed to monitor FSBS 2x daily. Dx: E11.9, Disp: 1 kit, Rfl: 1   Coenzyme Q10 (CO Q-10 PO), Take by mouth., Disp: , Rfl:    fluticasone (FLONASE) 50 MCG/ACT nasal spray, Place 2 sprays into both nostrils daily., Disp: , Rfl:    glipiZIDE (GLUCOTROL) 5 MG tablet, TAKE  1 TABLET (5 MG TOTAL) BY MOUTH TWICE A DAY BEFORE MEALS, Disp: 180 tablet, Rfl: 1   glucose blood (ACCU-CHEK GUIDE) test strip, USE STRIP IN THE MORNING AT NOON AND AT BEDTIME, Disp: 100 strip, Rfl: 2   hydrocortisone (ANUSOL-HC) 2.5 % rectal cream, Place 1 Application rectally 2 (two) times daily., Disp: 30 g, Rfl: 0   hyoscyamine (LEVSIN) 0.125 MG tablet, , Disp: , Rfl:    losartan (COZAAR) 25 MG tablet, START 1/2 TABLET BY MOUTH ONCE DAILY, Disp: 45 tablet, Rfl: 1   Magnesium 300 MG CAPS, Take 300 mg by mouth daily., Disp: , Rfl:    metFORMIN (GLUCOPHAGE-XR) 500 MG 24 hr tablet, Take 1 tablet (500 mg total) by mouth daily with breakfast., Disp: 90 tablet, Rfl: 3   Multiple Vitamin (MULTIVITAMIN) tablet, Take 1 tablet by mouth daily. [facility] +50, Disp: , Rfl:    OneTouch Delica Lancets 30G MISC, USE AS DIRECTED TO MONITOR FSBS 2X DAILY, Disp: 100 each, Rfl: 1   sertraline (ZOLOFT) 100 MG tablet, Take two tablets once daily, Disp: 180 tablet, Rfl: 3   Upadacitinib ER (RINVOQ) 30 MG TB24, Take 1 tablet (30 mg total) by mouth daily., Disp: , Rfl:       Objective:    Ht 5' 5.25" (1.657 m)   Wt 230 lb 6.4 oz (104.5 kg)   LMP  (  LMP Unknown)   SpO2 98%   BMI 38.05 kg/m   Wt Readings from Last 3 Encounters:  08/07/23 230 lb 6.4 oz (104.5 kg)  02/05/23 207 lb (93.9 kg)  11/05/22 206 lb (93.4 kg)    Physical Exam Constitutional:      General: She is not in acute distress.    Appearance: Normal appearance. She is normal weight. She is not ill-appearing, toxic-appearing or diaphoretic.  HENT:     Head: Normocephalic.  Cardiovascular:     Rate and Rhythm: Normal rate and regular rhythm.  Pulmonary:     Effort: Pulmonary effort is normal.  Musculoskeletal:        General: Normal range of motion.     Right hand: Swelling (medial phalynx) present. No bony tenderness.     Left hand: Swelling (medial phalynx) present. No bony tenderness.  Neurological:     General: No focal deficit  present.     Mental Status: She is alert and oriented to person, place, and time. Mental status is at baseline.  Psychiatric:        Mood and Affect: Mood normal.        Behavior: Behavior normal.        Thought Content: Thought content normal.        Judgment: Judgment normal.           Assessment & Plan:  Crohn's disease of colon without complication (HCC) -     Rinvoq; Take 1 tablet (30 mg total) by mouth daily.  Type 2 diabetes mellitus with diabetic neuropathy, without long-term current use of insulin (HCC) Assessment & Plan: Continue metformin 500 mg twice daily continue glipizide 5 mg once daily. Work on diabetic diet and exercise as tolerated. Yearly foot exam, and annual eye exam.  Urine m/a ordered today   Orders: -     Comprehensive metabolic panel with GFR  Hyperlipidemia associated with type 2 diabetes mellitus (HCC)  Microalbuminuria Assessment & Plan: Will repeat again today pending results.   Orders: -     Microalbumin / creatinine urine ratio  Weight gain -     TSH -     T4, free  Drug-induced insomnia (HCC)  Polyarthralgia -     ANA Screen,IFA,Reflex Titer/Pattern,Reflex Mplx 11 Ab Cascade with IdentRA  Acne vulgaris Assessment & Plan: Otc benzoyl peroxide and also differen gel at night time    Will r/o thyroid concern with weight gain however likely due to medication. For insomnia trial melatonin, can not consider trazodone and or hydroxyzine due to risk with glaucoma.   Return in about 6 months (around 02/06/2024) for f/u CPE.  Mort Sawyers, MSN, APRN, FNP-C [facility]

## 2023-08-07 NOTE — Assessment & Plan Note (Signed)
 Continue metformin 500 mg twice daily continue glipizide 5 mg once daily. Work on diabetic diet and exercise as tolerated. Yearly foot exam, and annual eye exam.  Urine m/a ordered today

## 2023-08-07 NOTE — Patient Instructions (Signed)
  Start some over the counter benzoyl peroxide in the day time and then differen gel at night time.

## 2023-08-07 NOTE — Assessment & Plan Note (Signed)
 Otc benzoyl peroxide and also differen gel at night time

## 2023-08-10 ENCOUNTER — Encounter: Payer: Self-pay | Admitting: Family

## 2023-08-11 LAB — ANA SCREEN,IFA,REFLEX TITER/PATTERN,REFLEX MPLX 11 AB CASCADE
Anti Nuclear Antibody (ANA): NEGATIVE
Cyclic Citrullin Peptide Ab: <16 U
MUTATED CITRULLINATED VIMENTIN (MCV) AB: <20 U/mL (ref <20)
Rheumatoid fact SerPl-aCnc: <10 [IU]/mL (ref <14)

## 2023-08-12 ENCOUNTER — Other Ambulatory Visit: Payer: Self-pay | Admitting: Family

## 2023-08-12 DIAGNOSIS — R809 Proteinuria, unspecified: Secondary | ICD-10-CM

## 2023-09-07 ENCOUNTER — Other Ambulatory Visit: Payer: Self-pay | Admitting: Obstetrics and Gynecology

## 2023-09-07 DIAGNOSIS — Z1231 Encounter for screening mammogram for malignant neoplasm of breast: Secondary | ICD-10-CM

## 2023-09-16 ENCOUNTER — Other Ambulatory Visit: Payer: Self-pay | Admitting: Family

## 2023-09-16 DIAGNOSIS — E785 Hyperlipidemia, unspecified: Secondary | ICD-10-CM

## 2023-10-06 ENCOUNTER — Ambulatory Visit
Admission: RE | Admit: 2023-10-06 | Discharge: 2023-10-06 | Disposition: A | Discharge status: Home / Self Care | Source: Ambulatory Visit | Attending: Obstetrics and Gynecology | Admitting: Obstetrics and Gynecology

## 2023-10-06 DIAGNOSIS — Z1231 Encounter for screening mammogram for malignant neoplasm of breast: Secondary | ICD-10-CM

## 2023-10-19 ENCOUNTER — Ambulatory Visit: Payer: Self-pay | Admitting: Family

## 2023-10-19 ENCOUNTER — Ambulatory Visit (INDEPENDENT_AMBULATORY_CARE_PROVIDER_SITE_OTHER): Admitting: Family

## 2023-10-19 VITALS — BP 142/90 | HR 70 | Temp 98.4°F | Ht 65.25 in | Wt 236.6 lb

## 2023-10-19 DIAGNOSIS — E785 Hyperlipidemia, unspecified: Secondary | ICD-10-CM

## 2023-10-19 DIAGNOSIS — R053 Chronic cough: Secondary | ICD-10-CM | POA: Diagnosis not present

## 2023-10-19 DIAGNOSIS — K5010 Crohn's disease of large intestine without complications: Secondary | ICD-10-CM

## 2023-10-19 DIAGNOSIS — R0609 Other forms of dyspnea: Secondary | ICD-10-CM | POA: Diagnosis not present

## 2023-10-19 DIAGNOSIS — G4719 Other hypersomnia: Secondary | ICD-10-CM | POA: Diagnosis not present

## 2023-10-19 DIAGNOSIS — R011 Cardiac murmur, unspecified: Secondary | ICD-10-CM | POA: Insufficient documentation

## 2023-10-19 DIAGNOSIS — R5382 Chronic fatigue, unspecified: Secondary | ICD-10-CM | POA: Diagnosis not present

## 2023-10-19 DIAGNOSIS — R809 Proteinuria, unspecified: Secondary | ICD-10-CM

## 2023-10-19 DIAGNOSIS — E1140 Type 2 diabetes mellitus with diabetic neuropathy, unspecified: Secondary | ICD-10-CM | POA: Diagnosis not present

## 2023-10-19 DIAGNOSIS — E1169 Type 2 diabetes mellitus with other specified complication: Secondary | ICD-10-CM

## 2023-10-19 MED ORDER — OMEPRAZOLE 20 MG PO CPDR
20.0000 mg | DELAYED_RELEASE_CAPSULE | Freq: Every day | ORAL | 0 refills | Status: DC
Start: 2023-10-19 — End: 2023-11-16

## 2023-10-19 NOTE — Patient Instructions (Addendum)
  A referral was placed today for an echocardiogram (ultrasound of your heart) as well as for a sleep study Please let us  know if you have not heard back within 2 weeks about the referral.  Trail omeprazole 20 mg once daily x two weeks to see if improvement of cough.

## 2023-10-19 NOTE — Assessment & Plan Note (Signed)
 Continue losartan.

## 2023-10-19 NOTE — Assessment & Plan Note (Signed)
Ordered hga1c today pending results. Work on diabetic diet and exercise as tolerated. Yearly foot exam, and annual eye exam.   

## 2023-10-19 NOTE — Assessment & Plan Note (Signed)
 Trial omeprazole x 2 weeks Try to decrease and or avoid spicy foods, fried fatty foods, and also caffeine and chocolate as these can increase heartburn symptoms.  Will r/o cardiac etiology with workup in office today Could consider losartan  Could consider chest xray as well will hold for now as I suspect cardiac

## 2023-10-19 NOTE — Assessment & Plan Note (Addendum)
 New finding on physical exam. Echocardiogram ordered EKG today in office. NSR HR 70 bpm Labs ordered pending results.

## 2023-10-19 NOTE — Assessment & Plan Note (Signed)
 Ordering a sleep study to r/o Sleep apnea

## 2023-10-19 NOTE — Progress Notes (Signed)
 Established Patient Office Visit  Subjective:   Patient ID: Kimberly Solis, female    DOB: 1960-12-06  Age: 63 y.o. MRN: [id number]  CC:  Chief Complaint  Patient presents with   Medical Management of Chronic Issues    Feels like she is having side effects from Rinvoq . States that she has already been in contact with the provider who prescribes this for her.    HPI: Kimberly Solis is a 63 y.o. female presenting on 10/19/2023 for Medical Management of Chronic Issues (Feels like she is having side effects from Rinvoq . States that she has already been in contact with the provider who prescribes this for her.)  She has noticed that she is more short of breath with exertion and she will have to take a deep breath, this has been in the last few months.   She still has a cough going on that will alternate between clear and thick, she states this cough has been ongoing since she started to take the rimvoq. She self adjusted her rimvoq to every other day vs daily, she states she has told her drug rep abbie vie but not her GI doctor who prescribes this. She feels tired a lot of the time as well. No chest pain does feel something pull in the right mid side of her chest, mainly when coughing. She does note some lower extremity edema at times. She continues to gain weight, about 15 pounds from 02/2023  She feels chest congestion in the middle from coughing so much. She is not wheezing  She does have excessive fatigue, will sit down and will fall asleep without even trying.  Tsh normal limits 08/07/23  Wt Readings from Last 3 Encounters:  10/19/23 236 lb 9.6 oz (107.3 kg)  08/07/23 230 lb 6.4 oz (104.5 kg)  02/05/23 207 lb (93.9 kg)           ROS: Negative unless specifically indicated above in HPI.   Relevant past medical history reviewed and updated as indicated.   Allergies and medications reviewed and updated.   Current Outpatient Medications:    ALPRAZolam  (XANAX ) 0.5 MG  tablet, TAKE 1 TABLET (0.5 MG TOTAL) BY MOUTH AT BEDTIME AS NEEDED FOR SLEEP., Disp: 30 tablet, Rfl: 2   atorvastatin  (LIPITOR) 40 MG tablet, TAKE 1 TABLET BY MOUTH EVERY DAY, Disp: 90 tablet, Rfl: 3   Blood Glucose Monitoring Suppl (BLOOD GLUCOSE SYSTEM PAK) KIT, Please dispense based on patient and insurance preference. Use as directed to monitor FSBS 2x daily. Dx: E11.9, Disp: 1 kit, Rfl: 1   Coenzyme Q10 (CO Q-10 PO), Take by mouth., Disp: , Rfl:    fluticasone (FLONASE) 50 MCG/ACT nasal spray, Place 2 sprays into both nostrils daily., Disp: , Rfl:    glipiZIDE  (GLUCOTROL ) 5 MG tablet, TAKE 1 TABLET (5 MG TOTAL) BY MOUTH TWICE A DAY BEFORE MEALS, Disp: 180 tablet, Rfl: 1   glucose blood (ACCU-CHEK GUIDE) test strip, USE STRIP IN THE MORNING AT NOON AND AT BEDTIME, Disp: 100 strip, Rfl: 2   hydrocortisone  (ANUSOL -HC) 2.5 % rectal cream, Place 1 Application rectally 2 (two) times daily., Disp: 30 g, Rfl: 0   hyoscyamine (LEVSIN) 0.125 MG tablet, , Disp: , Rfl:    losartan  (COZAAR ) 25 MG tablet, START 1/2 TABLET BY MOUTH ONCE DAILY, Disp: 45 tablet, Rfl: 1   Magnesium 300 MG CAPS, Take 300 mg by mouth daily., Disp: , Rfl:    metFORMIN  (GLUCOPHAGE -XR) 500 MG 24 hr tablet, Take  1 tablet (500 mg total) by mouth daily with breakfast., Disp: 90 tablet, Rfl: 3   Multiple Vitamin (MULTIVITAMIN) tablet, Take 1 tablet by mouth daily. [facility] +50, Disp: , Rfl:    omeprazole (PRILOSEC) 20 MG capsule, Take 1 capsule (20 mg total) by mouth daily., Disp: 30 capsule, Rfl: 0   OneTouch Delica Lancets 30G MISC, USE AS DIRECTED TO MONITOR FSBS 2X DAILY, Disp: 100 each, Rfl: 1   sertraline  (ZOLOFT ) 100 MG tablet, Take two tablets once daily, Disp: 180 tablet, Rfl: 3   Upadacitinib  ER (RINVOQ ) 30 MG TB24, Take 1 tablet (30 mg total) by mouth daily., Disp: , Rfl:   Allergies  Allergen Reactions   Doxycycline Shortness Of Breath   Mesalamine Er Shortness Of Breath    And dizziness   Trulicity  [Dulaglutide ]  Other (See Comments)    Aggravated chrohns Can tolerate ozempic    Azathioprine Nausea And Vomiting   Lisinopril  Other (See Comments)    headaches    Objective:   BP (!) 142/90 (BP Location: Right Arm, Patient Position: Sitting, Cuff Size: Large)   Pulse 70   Temp 98.4 F (36.9 C) (Temporal)   Ht 5' 5.25 (1.657 m)   Wt 236 lb 9.6 oz (107.3 kg)   LMP  (LMP Unknown)   SpO2 98%   BMI 39.07 kg/m    Physical Exam Vitals reviewed.  Constitutional:      General: She is not in acute distress.    Appearance: Normal appearance. She is normal weight. She is not ill-appearing, toxic-appearing or diaphoretic.  HENT:     Head: Normocephalic.   Cardiovascular:     Rate and Rhythm: Normal rate and regular rhythm.     Heart sounds: Murmur heard.  Pulmonary:     Effort: Pulmonary effort is normal.     Breath sounds: Normal breath sounds.   Musculoskeletal:        General: Normal range of motion.     Right lower leg: No edema.     Left lower leg: No edema.   Neurological:     General: No focal deficit present.     Mental Status: She is alert and oriented to person, place, and time. Mental status is at baseline.   Psychiatric:        Mood and Affect: Mood normal.        Behavior: Behavior normal.        Thought Content: Thought content normal.        Judgment: Judgment normal.   No carotid bruits  Assessment & Plan:  Excessive daytime sleepiness -     Ambulatory referral to Sleep Studies  Crohn's disease of colon without complication (HCC) -     Ambulatory referral to Gastroenterology  Chronic fatigue Assessment & Plan: Ordering a sleep study to r/o Sleep apnea  Orders: -     CBC with Differential/Platelet  Chronic cough Assessment & Plan: Trial omeprazole x 2 weeks Try to decrease and or avoid spicy foods, fried fatty foods, and also caffeine and chocolate as these can increase heartburn symptoms.  Will r/o cardiac etiology with workup in office today Could  consider losartan  Could consider chest xray as well will hold for now as I suspect cardiac   Orders: -     Omeprazole; Take 1 capsule (20 mg total) by mouth daily.  Dispense: 30 capsule; Refill: 0  DOE (dyspnea on exertion) -     EKG 12-Lead -     ECHOCARDIOGRAM COMPLETE;  Future -     Brain natriuretic peptide  Type 2 diabetes mellitus with diabetic neuropathy, without long-term current use of insulin (HCC) Assessment & Plan: Ordered hga1c today pending results. Work on diabetic diet and exercise as tolerated. Yearly foot exam, and annual eye exam.   Orders: -     Hemoglobin A1c -     Basic metabolic panel with GFR  Hyperlipidemia associated with type 2 diabetes mellitus (HCC) -     Lipid panel  Microalbuminuria Assessment & Plan: Continue losartan     Heart murmur Assessment & Plan: New finding on physical exam. Echocardiogram ordered EKG today in office. NSR HR 70 bpm Labs ordered pending results.       Follow up plan: Return in about 3 weeks (around 11/09/2023) for follow up DOE.  Felicita Horns, FNP

## 2023-10-20 LAB — BASIC METABOLIC PANEL WITH GFR
BUN: 13 mg/dL (ref 7–25)
CO2: 22 mmol/L (ref 20–32)
Calcium: 9.1 mg/dL (ref 8.6–10.4)
Chloride: 102 mmol/L (ref 98–110)
Creat: 0.74 mg/dL (ref 0.50–1.05)
Glucose, Bld: 262 mg/dL — ABNORMAL HIGH (ref 65–99)
Potassium: 4.7 mmol/L (ref 3.5–5.3)
Sodium: 138 mmol/L (ref 135–146)
eGFR: 91 mL/min/{1.73_m2} (ref >=60)

## 2023-10-20 LAB — CBC WITH DIFFERENTIAL/PLATELET
Absolute Lymphocytes: 1178 {cells}/uL (ref 850–3900)
Absolute Monocytes: 570 {cells}/uL (ref 200–950)
Basophils Absolute: 39 {cells}/uL (ref 0–200)
Basophils Relative: 0.5 %
Eosinophils Absolute: 77 {cells}/uL (ref 15–500)
Eosinophils Relative: 1 %
HCT: 37.9 % (ref 35.0–45.0)
Hemoglobin: 12.3 g/dL (ref 11.7–15.5)
MCH: 30.7 pg (ref 27.0–33.0)
MCHC: 32.5 g/dL (ref 32.0–36.0)
MCV: 94.5 fL (ref 80.0–100.0)
MPV: 9.9 fL (ref 7.5–12.5)
Monocytes Relative: 7.4 %
Neutro Abs: 5837 {cells}/uL (ref 1500–7800)
Neutrophils Relative %: 75.8 %
Platelets: 314 10*3/uL (ref 140–400)
RBC: 4.01 10*6/uL (ref 3.80–5.10)
RDW: 12 % (ref 11.0–15.0)
Total Lymphocyte: 15.3 %
WBC: 7.7 10*3/uL (ref 3.8–10.8)

## 2023-10-20 LAB — HEMOGLOBIN A1C
Hgb A1c MFr Bld: 9.2 % — ABNORMAL HIGH (ref <5.7)
Mean Plasma Glucose: 217 mg/dL
eAG (mmol/L): 12 mmol/L

## 2023-10-20 LAB — LIPID PANEL
Cholesterol: 184 mg/dL (ref <200)
HDL: 55 mg/dL (ref >=50)
LDL Cholesterol (Calc): 103 mg/dL — ABNORMAL HIGH
Non-HDL Cholesterol (Calc): 129 mg/dL (ref <130)
Total CHOL/HDL Ratio: 3.3 (calc) (ref <5.0)
Triglycerides: 161 mg/dL — ABNORMAL HIGH (ref <150)

## 2023-10-20 LAB — BRAIN NATRIURETIC PEPTIDE: Brain Natriuretic Peptide: 34 pg/mL (ref <100)

## 2023-10-21 ENCOUNTER — Encounter: Payer: Self-pay | Admitting: *Deleted

## 2023-10-21 ENCOUNTER — Other Ambulatory Visit: Payer: Self-pay | Admitting: Family

## 2023-10-21 DIAGNOSIS — E1169 Type 2 diabetes mellitus with other specified complication: Secondary | ICD-10-CM

## 2023-10-21 DIAGNOSIS — E1141 Type 2 diabetes mellitus with diabetic mononeuropathy: Secondary | ICD-10-CM

## 2023-10-21 DIAGNOSIS — E1140 Type 2 diabetes mellitus with diabetic neuropathy, unspecified: Secondary | ICD-10-CM

## 2023-10-21 MED ORDER — METFORMIN HCL ER 500 MG PO TB24: 500.0000 mg | ORAL_TABLET | Freq: Two times a day (BID) | ORAL | Status: DC

## 2023-10-21 MED ORDER — EZETIMIBE 10 MG PO TABS: 10.0000 mg | ORAL_TABLET | Freq: Every day | ORAL | 3 refills | Status: DC

## 2023-10-21 MED ORDER — ATORVASTATIN CALCIUM 20 MG PO TABS: 20.0000 mg | ORAL_TABLET | Freq: Every day | ORAL | Status: DC

## 2023-10-21 MED ORDER — GLIPIZIDE 5 MG PO TABS: ORAL_TABLET | ORAL | Status: DC

## 2023-10-26 ENCOUNTER — Encounter: Payer: Self-pay | Admitting: Family

## 2023-10-28 ENCOUNTER — Encounter: Payer: Self-pay | Admitting: Family

## 2023-10-28 DIAGNOSIS — E1140 Type 2 diabetes mellitus with diabetic neuropathy, unspecified: Secondary | ICD-10-CM

## 2023-10-28 MED ORDER — ACCU-CHEK GUIDE TEST VI STRP
ORAL_STRIP | 12 refills | Status: AC
Start: 2023-10-28 — End: ?

## 2023-10-29 ENCOUNTER — Ambulatory Visit (HOSPITAL_COMMUNITY)
Admission: RE | Admit: 2023-10-29 | Discharge: 2023-10-29 | Disposition: A | Discharge status: Home / Self Care | Source: Ambulatory Visit | Attending: Cardiovascular Disease | Admitting: Cardiovascular Disease

## 2023-10-29 DIAGNOSIS — R0609 Other forms of dyspnea: Secondary | ICD-10-CM

## 2023-10-29 LAB — ECHOCARDIOGRAM COMPLETE
AR max vel: 1.69 cm2
AV Area VTI: 1.57 cm2
AV Area mean vel: 1.58 cm2
AV Mean grad: 11 mmHg
AV Peak grad: 18.8 mmHg
Ao pk vel: 2.17 m/s
Area-P 1/2: 4.74 cm2
S' Lateral: 2.8 cm

## 2023-11-03 ENCOUNTER — Telehealth: Payer: Self-pay

## 2023-11-05 DIAGNOSIS — G4730 Sleep apnea, unspecified: Secondary | ICD-10-CM | POA: Diagnosis not present

## 2023-11-12 ENCOUNTER — Other Ambulatory Visit: Payer: Self-pay | Admitting: Family

## 2023-11-12 DIAGNOSIS — R053 Chronic cough: Secondary | ICD-10-CM

## 2023-11-13 NOTE — Telephone Encounter (Signed)
 90 day refill request. Last filled on 10/19/23 at [facility] with PCP stating she was going to do a trial of med. ? If okay to refill #90, please advise   CPE scheduled 02/05/24

## 2023-11-20 DIAGNOSIS — Z01419 Encounter for gynecological examination (general) (routine) without abnormal findings: Secondary | ICD-10-CM | POA: Diagnosis not present

## 2023-11-20 DIAGNOSIS — Z124 Encounter for screening for malignant neoplasm of cervix: Secondary | ICD-10-CM | POA: Diagnosis not present

## 2023-11-25 ENCOUNTER — Encounter: Payer: Self-pay | Admitting: Family

## 2023-11-30 ENCOUNTER — Ambulatory Visit (INDEPENDENT_AMBULATORY_CARE_PROVIDER_SITE_OTHER): Admitting: Family

## 2023-11-30 ENCOUNTER — Other Ambulatory Visit (HOSPITAL_BASED_OUTPATIENT_CLINIC_OR_DEPARTMENT_OTHER)

## 2023-11-30 ENCOUNTER — Encounter: Payer: Self-pay | Admitting: Family

## 2023-11-30 VITALS — BP 124/82 | HR 78 | Temp 98.5°F | Ht 65.25 in | Wt 228.0 lb

## 2023-11-30 DIAGNOSIS — G4733 Obstructive sleep apnea (adult) (pediatric): Secondary | ICD-10-CM | POA: Diagnosis not present

## 2023-11-30 DIAGNOSIS — E1140 Type 2 diabetes mellitus with diabetic neuropathy, unspecified: Secondary | ICD-10-CM

## 2023-11-30 DIAGNOSIS — Z7984 Long term (current) use of oral hypoglycemic drugs: Secondary | ICD-10-CM | POA: Diagnosis not present

## 2023-11-30 NOTE — Patient Instructions (Signed)
  Take glipizide  two tablets in am (total of 10 mg) and one tablet at night time (5 mg)  And continue metformin  500 mg XR twice daily.

## 2023-11-30 NOTE — Assessment & Plan Note (Signed)
 Discussed appropriate medications.  Change to glipizide  10 mg in am 5 mg at night time Continue metformin  XR 500 mg twice daily.  Continue with diet and exercise as tolerated.

## 2023-11-30 NOTE — Assessment & Plan Note (Signed)
 Discussed results with patient CPAP supplies needed: mask of choice, headgear, cushions, filters, climate control tubing and water chamber. Consider heated humidity. Length of Need: Lifetime Date of face-to-face encounter: Settings: auto 5-20 cm h2O  Discussed as well lifestyle measures to implement including exercise, working on diet, and weight loss would be helpful  Advised pt to f/u within 90 days to assess response to cpap interventions

## 2023-11-30 NOTE — Progress Notes (Signed)
 Established Patient Office Visit  Subjective:   Patient ID: Kimberly Solis, female    DOB: Apr 18, 1961  Age: 63 y.o. MRN: [id number]  CC:  Chief Complaint  Patient presents with   Medical Management of Chronic Issues    HPI: Kimberly Solis is a 63 y.o. female presenting on 11/30/2023 for Medical Management of Chronic Issues  Here for f/u sleep study results.   Recent sleep study performed incidicative of severe obstructive sleep apnea.  Report as follows, total apneas 140 and 164 hypopneas. Baseline o2 sat *% Total of 289 saturations during recordings lowest at 68%  Snoring evidenced at 445 snores per hour  Pt symptoms include:   Morning headaches Excessive daytime fatigue Frequent night time awakenings  Snoring   Wt Readings from Last 3 Encounters:  11/30/23 228 lb (103.4 kg)  10/19/23 236 lb 9.6 oz (107.3 kg)  08/07/23 230 lb 6.4 oz (104.5 kg)   DM2: last A1c was 9.2. fasting glucose averages around 157 to 203 lowest was today 139 fasting. Metformin  taking one tablet twice daily, glipizide  whole in am half in afternoon even though she is supposed to be taking glipizide  two tablets in am (total 10 mg) and one tablet at night time (5 mg) she states she was advised the prior. She denies sx of hypoglycemia.         ROS: Negative unless specifically indicated above in HPI.   Relevant past medical history reviewed and updated as indicated.   Allergies and medications reviewed and updated.   Current Outpatient Medications:    ALPRAZolam  (XANAX ) 0.5 MG tablet, TAKE 1 TABLET (0.5 MG TOTAL) BY MOUTH AT BEDTIME AS NEEDED FOR SLEEP., Disp: 30 tablet, Rfl: 2   atorvastatin  (LIPITOR) 20 MG tablet, Take 1 tablet (20 mg total) by mouth daily., Disp: , Rfl:    Blood Glucose Monitoring Suppl (BLOOD GLUCOSE SYSTEM PAK) KIT, Please dispense based on patient and insurance preference. Use as directed to monitor FSBS 2x daily. Dx: E11.9, Disp: 1 kit, Rfl: 1   Coenzyme Q10 (CO  Q-10 PO), Take by mouth., Disp: , Rfl:    ezetimibe  (ZETIA ) 10 MG tablet, Take 1 tablet (10 mg total) by mouth daily., Disp: 90 tablet, Rfl: 3   fluticasone (FLONASE) 50 MCG/ACT nasal spray, Place 2 sprays into both nostrils daily., Disp: , Rfl:    glipiZIDE  (GLUCOTROL ) 5 MG tablet, Take two tablets in am (total 10 mg) at one tablet at night time (5 mg), Disp: , Rfl:    glucose blood (ACCU-CHEK GUIDE TEST) test strip, Use as instructed, Disp: 100 each, Rfl: 12   hydrocortisone  (ANUSOL -HC) 2.5 % rectal cream, Place 1 Application rectally 2 (two) times daily., Disp: 30 g, Rfl: 0   hyoscyamine (LEVSIN) 0.125 MG tablet, , Disp: , Rfl:    losartan  (COZAAR ) 25 MG tablet, START 1/2 TABLET BY MOUTH ONCE DAILY, Disp: 45 tablet, Rfl: 1   Magnesium 300 MG CAPS, Take 300 mg by mouth daily., Disp: , Rfl:    metFORMIN  (GLUCOPHAGE -XR) 500 MG 24 hr tablet, Take 1 tablet (500 mg total) by mouth 2 (two) times daily with a meal., Disp: , Rfl:    Multiple Vitamin (MULTIVITAMIN) tablet, Take 1 tablet by mouth daily. [facility] +50, Disp: , Rfl:    omeprazole  (PRILOSEC) 20 MG capsule, TAKE 1 CAPSULE BY MOUTH EVERY DAY, Disp: 90 capsule, Rfl: 0   OneTouch Delica Lancets 30G MISC, USE AS DIRECTED TO MONITOR FSBS 2X DAILY, Disp: 100 each,  Rfl: 1   sertraline  (ZOLOFT ) 100 MG tablet, Take two tablets once daily, Disp: 180 tablet, Rfl: 3   Upadacitinib  ER (RINVOQ ) 30 MG TB24, Take 1 tablet (30 mg total) by mouth daily., Disp: , Rfl:   Allergies  Allergen Reactions   Doxycycline Shortness Of Breath   Mesalamine Er Shortness Of Breath    And dizziness   Trulicity  [Dulaglutide ] Other (See Comments)    Aggravated chrohns Can tolerate ozempic    Azathioprine Nausea And Vomiting   Lisinopril  Other (See Comments)    headaches    Objective:   BP 124/82 (BP Location: Left Arm, Patient Position: Sitting, Cuff Size: Normal)   Pulse 78   Temp 98.5 F (36.9 C) (Temporal)   Ht 5' 5.25 (1.657 m)   Wt 228 lb (103.4 kg)    LMP  (LMP Unknown)   SpO2 98%   BMI 37.65 kg/m    Physical Exam Vitals reviewed.  Constitutional:      General: She is not in acute distress.    Appearance: Normal appearance. She is obese. She is not ill-appearing, toxic-appearing or diaphoretic.  HENT:     Head: Normocephalic.  Cardiovascular:     Rate and Rhythm: Normal rate.  Pulmonary:     Effort: Pulmonary effort is normal.  Musculoskeletal:        General: Normal range of motion.  Neurological:     General: No focal deficit present.     Mental Status: She is alert and oriented to person, place, and time. Mental status is at baseline.  Psychiatric:        Mood and Affect: Mood normal.        Behavior: Behavior normal.        Thought Content: Thought content normal.        Judgment: Judgment normal.     Assessment & Plan:  Severe obstructive sleep apnea in adult Assessment & Plan: Discussed results with patient CPAP supplies needed: mask of choice, headgear, cushions, filters, climate control tubing and water chamber. Consider heated humidity. Length of Need: Lifetime Date of face-to-face encounter: Settings: auto 5-20 cm h2O  Discussed as well lifestyle measures to implement including exercise, working on diet, and weight loss would be helpful  Advised pt to f/u within 90 days to assess response to cpap interventions     Type 2 diabetes mellitus with diabetic neuropathy, without long-term current use of insulin (HCC) Assessment & Plan: Discussed appropriate medications.  Change to glipizide  10 mg in am 5 mg at night time Continue metformin  XR 500 mg twice daily.  Continue with diet and exercise as tolerated.       Follow up plan: Return for as scheduled 10/3.  Ginger Patrick, FNP

## 2023-12-03 ENCOUNTER — Ambulatory Visit: Payer: Self-pay | Admitting: Family

## 2023-12-03 NOTE — Progress Notes (Signed)
 noted

## 2023-12-10 ENCOUNTER — Encounter: Payer: Self-pay | Admitting: Family

## 2023-12-10 DIAGNOSIS — G4733 Obstructive sleep apnea (adult) (pediatric): Secondary | ICD-10-CM

## 2023-12-15 NOTE — Telephone Encounter (Signed)
?  can you look into this lindsay? Looks like there might be some confusion going on.      ------------------------------------  Hi Conrad Zajkowski,  I hope you're doing well and had a nice weekend with the gorgeous weather!  [employer] corporate received a phone call from one of your patients saying they were supposed to call [facility] if they didn't hear from their DME about cpap in a week.  Patient initials Kimberly Solis dob 01/26/1961.  The sleep study was released on July 15th.  Did you send a cpap order to [facility] in [place]?   Feel free to call my cell phone listed below.    I want to help take care of this patient.  Best Regards, Clotilda

## 2023-12-15 NOTE — Telephone Encounter (Signed)
 The order for CPAP wasn't placed. This has been done and faxed to [facility].

## 2023-12-16 ENCOUNTER — Telehealth: Payer: Self-pay | Admitting: *Deleted

## 2023-12-16 NOTE — Telephone Encounter (Signed)
 Copied from CRM [id number]. Topic: General - Other >> Dec 16, 2023  4:28 PM Thersia BROCKS wrote: Reason for CRM: Clotilda called in regarding the CPAP , did inform her per note that order was sent over to [facility] , She was under the impression that it was suppose to be sent to [facility] , would like a callback for clarification about this    Clotilda [phone]

## 2023-12-17 NOTE — Telephone Encounter (Signed)
 Spoke with Clotilda with [facility]. She was just letting know that the pt reached out to them about not hearing anything about her CPAP order. Clotilda let the pt know that we sent her order to [facility].

## 2023-12-24 NOTE — Telephone Encounter (Signed)
 Spoke with Clotilda at [facility] diagnostics. Myles Clotilda that all orders have been faxed to [facility] and are pending.

## 2023-12-24 NOTE — Telephone Encounter (Unsigned)
 Copied from CRM #[id number]. Topic: General - Other >> Dec 24, 2023  2:31 PM Viola F wrote: Reason for CRM: Clotilda from [facility] request a call back regarding patietns cpap machine, says this is urgent and severe and patient should have gotten treatment a while ago. Please call her today before 4pm [phone]. If she doesn't answer please leave detailed message. I let her know that the order was sent to [facility]

## 2023-12-25 DIAGNOSIS — G4733 Obstructive sleep apnea (adult) (pediatric): Secondary | ICD-10-CM | POA: Diagnosis not present

## 2023-12-25 NOTE — Telephone Encounter (Unsigned)
 Copied from CRM #[id number]. Topic: General - Other >> Dec 24, 2023  3:56 PM Burnard DEL wrote: Reason for CRM: Mitch for [facility] called in to let Morna know that they received all documents and they were able to reach out and get the patient scheduled for tomorrow at 10 am. He stated that they set patients up from the day that they receive the order unless patients insurance requires a PA before 7 to  10day time frame that they reach out to them.

## 2024-01-14 ENCOUNTER — Other Ambulatory Visit: Payer: Self-pay | Admitting: Family

## 2024-01-14 DIAGNOSIS — E1140 Type 2 diabetes mellitus with diabetic neuropathy, unspecified: Secondary | ICD-10-CM

## 2024-01-14 DIAGNOSIS — F411 Generalized anxiety disorder: Secondary | ICD-10-CM

## 2024-01-19 DIAGNOSIS — K5730 Diverticulosis of large intestine without perforation or abscess without bleeding: Secondary | ICD-10-CM | POA: Diagnosis not present

## 2024-01-19 DIAGNOSIS — K5010 Crohn's disease of large intestine without complications: Secondary | ICD-10-CM | POA: Diagnosis not present

## 2024-01-27 ENCOUNTER — Other Ambulatory Visit: Payer: Self-pay | Admitting: Family

## 2024-01-27 DIAGNOSIS — R809 Proteinuria, unspecified: Secondary | ICD-10-CM

## 2024-02-05 ENCOUNTER — Ambulatory Visit: Admitting: Family

## 2024-02-05 ENCOUNTER — Encounter: Payer: Self-pay | Admitting: Family

## 2024-02-05 ENCOUNTER — Telehealth: Payer: Self-pay | Admitting: Family

## 2024-02-05 VITALS — BP 128/80 | HR 77 | Temp 98.1°F | Ht 65.25 in | Wt 227.4 lb

## 2024-02-05 DIAGNOSIS — Z789 Other specified health status: Secondary | ICD-10-CM

## 2024-02-05 DIAGNOSIS — E1140 Type 2 diabetes mellitus with diabetic neuropathy, unspecified: Secondary | ICD-10-CM | POA: Diagnosis not present

## 2024-02-05 DIAGNOSIS — R229 Localized swelling, mass and lump, unspecified: Secondary | ICD-10-CM

## 2024-02-05 DIAGNOSIS — Z0001 Encounter for general adult medical examination with abnormal findings: Secondary | ICD-10-CM

## 2024-02-05 DIAGNOSIS — E785 Hyperlipidemia, unspecified: Secondary | ICD-10-CM

## 2024-02-05 DIAGNOSIS — E1141 Type 2 diabetes mellitus with diabetic mononeuropathy: Secondary | ICD-10-CM

## 2024-02-05 DIAGNOSIS — Z7984 Long term (current) use of oral hypoglycemic drugs: Secondary | ICD-10-CM

## 2024-02-05 DIAGNOSIS — T466X5A Adverse effect of antihyperlipidemic and antiarteriosclerotic drugs, initial encounter: Secondary | ICD-10-CM

## 2024-02-05 DIAGNOSIS — M791 Myalgia, unspecified site: Secondary | ICD-10-CM

## 2024-02-05 DIAGNOSIS — E1169 Type 2 diabetes mellitus with other specified complication: Secondary | ICD-10-CM | POA: Diagnosis not present

## 2024-02-05 DIAGNOSIS — R053 Chronic cough: Secondary | ICD-10-CM

## 2024-02-05 DIAGNOSIS — G4733 Obstructive sleep apnea (adult) (pediatric): Secondary | ICD-10-CM

## 2024-02-05 LAB — LIPID PANEL
Cholesterol: 157 mg/dL (ref 0–200)
HDL: 46.6 mg/dL (ref >39.00)
LDL Cholesterol: 90 mg/dL (ref 0–99)
NonHDL: 110.2
Total CHOL/HDL Ratio: 3
Triglycerides: 102 mg/dL (ref 0.0–149.0)
VLDL: 20.4 mg/dL (ref 0.0–40.0)

## 2024-02-05 LAB — HEMOGLOBIN A1C: Hgb A1c MFr Bld: 8 % — ABNORMAL HIGH (ref 4.6–6.5)

## 2024-02-05 MED ORDER — GLIPIZIDE 5 MG PO TABS: 5.0000 mg | ORAL_TABLET | Freq: Every day | ORAL | Status: DC

## 2024-02-05 MED ORDER — METFORMIN HCL ER 500 MG PO TB24: 500.0000 mg | ORAL_TABLET | Freq: Two times a day (BID) | ORAL | Status: DC

## 2024-02-05 NOTE — Progress Notes (Signed)
 Subjective:  Patient ID: Kimberly Solis, female    DOB: 1960/12/14  Age: 63 y.o. MRN: [id number]  Patient Care Team: Corwin Antu, FNP as PCP - General (Family Medicine) Cleotilde Sewer, OD (Optometry) Mat Browning, MD as Consulting Physician (Obstetrics and Gynecology)   CC:  Chief Complaint  Patient presents with   Annual Exam    HPI Kimberly Solis is a 63 y.o. female who presents today for an annual physical exam. She reports consuming a general diet. Physically active outside in her yard She generally feels well. She reports sleeping poorly. She does not have additional problems to discuss today.   Vision:Within last year Dental:Receives regular dental care  Mammogram: 10/06/23 Last [facility]: [facility] 2025  Colonoscopy: 02/16/23   Pt is without acute concerns.   Discussed the use of AI scribe software for clinical note transcription with the patient, who gave verbal consent to proceed.  History of Present Illness Kimberly Solis is a 63 year old female who presents for an annual physical exam.  She has been monitoring her blood sugar levels daily, with most readings below 120 mg/dL, occasional spikes up to 140 mg/dL, and one instance of 819 mg/dL. She attributes recent spikes to dietary changes while her sister is visiting. She has self-adjusted her diabetes medication, reducing glipizide  from three times a day to once in the afternoon, and is currently taking metformin  twice daily. She has a history of high A1c levels, previously at 9.2%, and has lost 10 pounds since her last visit six months ago.  She is taking Rinvoq , which she has self-adjusted to every other day due to side effects. She experiences numbness in her toes in the morning.  She has a history of statin intolerance, having experienced leg cramps and swelling with multiple statins, including atorvastatin , rosuvastatin , and pravastatin . She is currently taking a reduced dose of atorvastatin ,  which still causes some discomfort.  She reports poor sleep quality, averaging four hours per night, and uses a CPAP machine for severe sleep apnea. She experiences anxiety with the CPAP mask and has difficulty sleeping through the night. She also reports a persistent cough and postnasal drip, which she attributes to allergies.  She has a cyst on the back of her neck that has been gradually increasing in size. It is not tender or red, but she is concerned due to its location.   Lab Results  Component Value Date   HGBA1C 9.2 (H) 10/19/2023    Advanced Directives Patient does have advanced directives. She does not have a copy in the electronic medical record.   DEPRESSION SCREENING    02/05/2024    8:29 AM 10/19/2023   10:30 AM 08/07/2023    8:39 AM 02/05/2023    7:56 AM 11/05/2022    7:35 AM 06/16/2022    8:10 AM 05/20/2021    8:38 AM  PHQ 2/9 Scores  PHQ - 2 Score 0 1 0 0 1 1 0  PHQ- 9 Score 4 5 6 3 3 5 4      ROS: Negative unless specifically indicated above in HPI.    Current Outpatient Medications:    ALPRAZolam  (XANAX ) 0.5 MG tablet, TAKE 1 TABLET (0.5 MG TOTAL) BY MOUTH AT BEDTIME AS NEEDED FOR SLEEP., Disp: 30 tablet, Rfl: 2   Blood Glucose Monitoring Suppl (BLOOD GLUCOSE SYSTEM PAK) KIT, Please dispense based on patient and insurance preference. Use as directed to monitor FSBS 2x daily. Dx: E11.9, Disp: 1 kit, Rfl: 1  Coenzyme Q10 (CO Q-10 PO), Take by mouth., Disp: , Rfl:    ezetimibe  (ZETIA ) 10 MG tablet, Take 1 tablet (10 mg total) by mouth daily., Disp: 90 tablet, Rfl: 3   fluticasone (FLONASE) 50 MCG/ACT nasal spray, Place 2 sprays into both nostrils daily., Disp: , Rfl:    glipiZIDE  (GLUCOTROL ) 5 MG tablet, TAKE 1 TABLET (5 MG TOTAL) BY MOUTH TWICE A DAY BEFORE MEALS, Disp: 180 tablet, Rfl: 1   glucose blood (ACCU-CHEK GUIDE TEST) test strip, Use as instructed, Disp: 100 each, Rfl: 12   hydrocortisone  (ANUSOL -HC) 2.5 % rectal cream, Place 1 Application rectally 2 (two)  times daily., Disp: 30 g, Rfl: 0   hyoscyamine (LEVSIN) 0.125 MG tablet, , Disp: , Rfl:    losartan  (COZAAR ) 25 MG tablet, START 1/2 TABLET BY MOUTH ONCE DAILY, Disp: 45 tablet, Rfl: 1   Magnesium 300 MG CAPS, Take 300 mg by mouth daily., Disp: , Rfl:    metFORMIN  (GLUCOPHAGE -XR) 500 MG 24 hr tablet, Take 1 tablet (500 mg total) by mouth 2 (two) times daily with a meal., Disp: , Rfl:    Multiple Vitamin (MULTIVITAMIN) tablet, Take 1 tablet by mouth daily. [facility] +50, Disp: , Rfl:    omeprazole  (PRILOSEC) 20 MG capsule, TAKE 1 CAPSULE BY MOUTH EVERY DAY, Disp: 90 capsule, Rfl: 0   OneTouch Delica Lancets 30G MISC, USE AS DIRECTED TO MONITOR FSBS 2X DAILY, Disp: 100 each, Rfl: 1   sertraline  (ZOLOFT ) 100 MG tablet, TAKE 2 TABLETS BY MOUTH EVERY DAY, Disp: 180 tablet, Rfl: 1   Upadacitinib  ER (RINVOQ ) 30 MG TB24, Take 1 tablet (30 mg total) by mouth daily., Disp: , Rfl:     Objective:    BP 128/80 (BP Location: Left Arm, Patient Position: Sitting, Cuff Size: Large)   Pulse 77   Temp 98.1 F (36.7 C) (Temporal)   Ht 5' 5.25 (1.657 m)   Wt 227 lb 6.4 oz (103.1 kg)   LMP  (LMP Unknown)   SpO2 99%   BMI 37.55 kg/m   BP Readings from Last 3 Encounters:  02/05/24 128/80  11/30/23 124/82  10/19/23 (!) 142/90      Physical Exam Vitals reviewed.  Constitutional:      General: She is not in acute distress.    Appearance: Normal appearance. She is obese. She is not ill-appearing.  HENT:     Head: Normocephalic.     Right Ear: A middle ear effusion is present.     Left Ear: Tympanic membrane normal.     Nose: Nose normal.     Mouth/Throat:     Mouth: Mucous membranes are moist.  Eyes:     Extraocular Movements: Extraocular movements intact.     Pupils: Pupils are equal, round, and reactive to light.  Cardiovascular:     Rate and Rhythm: Normal rate and regular rhythm.  Pulmonary:     Effort: Pulmonary effort is normal.     Breath sounds: Normal breath sounds.  Abdominal:      General: Abdomen is flat. Bowel sounds are normal.     Palpations: Abdomen is soft.     Tenderness: There is no guarding or rebound.  Musculoskeletal:        General: Normal range of motion.     Cervical back: Normal range of motion.  Skin:    General: Skin is warm.     Capillary Refill: Capillary refill takes less than 2 seconds.     Comments: Subcutaneous nodule  beneath skin on back right posterior neck at base indurated approx 1.5 cm diameter without erythema or warmth no head or discharge   Neurological:     General: No focal deficit present.     Mental Status: She is alert.  Psychiatric:        Mood and Affect: Mood normal.        Behavior: Behavior normal.        Thought Content: Thought content normal.        Judgment: Judgment normal.      Title   Diabetic Foot Exam - detailed Is there a history of foot ulcer?: No Is there a foot ulcer now?: No Is there swelling?: No Is there elevated skin temperature?: No Is there abnormal foot shape?: No Is there a claw toe deformity?: No Are the toenails long?: No Are the toenails thick?: No Are the toenails ingrown?: No Is the skin thin, fragile, shiny and hairless?: No Normal Range of Motion?: Yes Is there foot or ankle muscle weakness?: No Do you have pain in calf while walking?: No Are the shoes appropriate in style and fit?: Yes Can the patient see the bottom of their feet?: Yes Pulse Foot Exam completed.: Yes   Right Posterior Tibialis: Present Left posterior Tibialis: Present   Right Dorsalis Pedis: Present Left Dorsalis Pedis: Present     Sensory Foot Exam Completed.: Yes Semmes-Weinstein Monofilament Test + means has sensation and - means no sensation  R Foot Test Control: Neg L Foot Test Control: Neg   R Site 1-Great Toe: Neg L Site 1-Great Toe: Neg   R Site 4: Neg L Site 4: Neg   R site 5: Neg L Site 5: Neg  R Site 6: Neg L Site 6: Neg     Image components are not supported.   Image  components are not supported. Image components are not supported.  [place] Comments     Results LABS A1c: 9.2  DIAGNOSTIC Retinal examination: No retinopathy (04/22/2023)      Assessment & Plan:   Assessment and Plan Assessment & Plan  Patient Counseling(The following topics were reviewed):  Preventative care handout given to pt  Health maintenance and immunizations reviewed. Please refer to Health maintenance section. Pt advised on safe sex, wearing seatbelts in car, and proper nutrition labwork ordered today for annual Dental health: Discussed importance of regular tooth brushing, flossing, and dental visits. -Patient Counseling(The following topics were reviewed):  Preventative care handout given to pt  Health maintenance and immunizations reviewed. Please refer to Health maintenance section. Pt advised on safe sex, wearing seatbelts in car, and proper nutrition labwork ordered today for annual Dental health: Discussed importance of regular tooth brushing, flossing, and dental visits.  Adult Wellness Visit Routine adult wellness visit with discussion on recent Pap smear, gynecological care, and advanced directive. - Continue annual Pap smears with gynecologist - Provide a copy of advanced directive to the office  Type 2 Diabetes Mellitus Type 2 diabetes mellitus with recent self-adjustment of glipizide  dosage. Current regimen includes metformin  and glipizide . Blood glucose levels fluctuating, possibly due to dietary changes. Goal to reduce glipizide  due to hypoglycemia risk and explore heart and kidney protective options. - Continue current regimen of metformin  and glipizide  - Monitor blood glucose levels - Discuss any medication changes before adjusting - Check HbA1c levels  Obstructive Sleep Apnea, Severe Severe obstructive sleep apnea with difficulty tolerating CPAP due to anxiety and sensation of drowning. - Refer to Dr. Jess, pulmonologist  specializing in  sleep apnea  Insomnia Chronic insomnia with difficulty initiating and maintaining sleep. CPAP use challenging due to anxiety. - Consult pharmacist regarding safe sleep aid options considering glaucoma and sleep apnea  Anxiety Disorder Chronic anxiety disorder managed with sertraline . Anxiety impacts CPAP use and sleep quality. - Continue sertraline  - Address anxiety related to CPAP use with pulmonologist  Statin Intolerance and Hyperlipidemia Intolerance to multiple statins due to muscle cramps and leg swelling. Currently on low dose atorvastatin  with side effects. Consideration for Repatha if cholesterol remains high. - Discontinue atorvastatin  due to side effects - Consider Repatha if cholesterol levels remain elevated  Allergic Rhinitis (suspected) Suspected allergic rhinitis with postnasal drip and persistent cough. CPAP use may exacerbate symptoms. - Consult with eye doctor regarding use of Allegra - Consider starting Allegra if approved by eye doctor  Glaucoma, Narrow Angle Narrow angle glaucoma with potential medication interactions. Monitoring for acute symptoms such as severe headache or vision changes.  Osteoarthritis Osteoarthritis with morning stiffness and numbness in toes. Symptoms improve throughout the day.  Cyst, Posterior Neck Cyst on the posterior neck, slowly increasing in size. No signs of infection or tenderness. - Refer to dermatologist for evaluation of neck cyst         Follow-up: Return in about 6 months (around 08/05/2024) for f/u diabetes.   Ginger Patrick, FNP

## 2024-02-05 NOTE — Telephone Encounter (Signed)
 Anything we can consider for pt for sleep meds? She is sleeping very poorly she does however have severe sleep apnea and narrow angle glaucoma.   I am assuming no go for temazepam and also trazodone for sleep apnea/glaucoma.   There is an indication for caution with OSA with lunesta and or ambien but do you think this might be ok at a low dose?

## 2024-02-07 ENCOUNTER — Other Ambulatory Visit: Payer: Self-pay | Admitting: Family

## 2024-02-07 DIAGNOSIS — F411 Generalized anxiety disorder: Secondary | ICD-10-CM

## 2024-02-08 NOTE — Telephone Encounter (Signed)
 Thank you :)

## 2024-02-11 ENCOUNTER — Ambulatory Visit: Payer: Self-pay | Admitting: Family

## 2024-02-11 DIAGNOSIS — E119 Type 2 diabetes mellitus without complications: Secondary | ICD-10-CM | POA: Diagnosis not present

## 2024-02-11 DIAGNOSIS — F419 Anxiety disorder, unspecified: Secondary | ICD-10-CM | POA: Diagnosis not present

## 2024-02-11 DIAGNOSIS — K5730 Diverticulosis of large intestine without perforation or abscess without bleeding: Secondary | ICD-10-CM | POA: Diagnosis not present

## 2024-02-11 DIAGNOSIS — F5101 Primary insomnia: Secondary | ICD-10-CM

## 2024-02-11 DIAGNOSIS — K5010 Crohn's disease of large intestine without complications: Secondary | ICD-10-CM | POA: Diagnosis not present

## 2024-02-12 ENCOUNTER — Encounter: Payer: Self-pay | Admitting: *Deleted

## 2024-02-12 DIAGNOSIS — G4733 Obstructive sleep apnea (adult) (pediatric): Secondary | ICD-10-CM

## 2024-02-16 NOTE — Addendum Note (Signed)
 Addended by: CORWIN ANTU on: 02/16/2024 11:33 AM   Modules accepted: Orders

## 2024-02-17 MED ORDER — ZOLPIDEM TARTRATE 5 MG PO TABS: 5.0000 mg | ORAL_TABLET | Freq: Every evening | ORAL | 1 refills | Status: AC | PRN

## 2024-02-17 NOTE — Addendum Note (Signed)
 Addended by: CORWIN ANTU on: 02/17/2024 03:22 PM   Modules accepted: Orders

## 2024-03-03 NOTE — Telephone Encounter (Signed)
 I am so sorry this was likely an error on my part with placing the referral Can you cancel the sleep study snap order? I replaced with pulmonary referral. thanks

## 2024-03-11 ENCOUNTER — Ambulatory Visit (INDEPENDENT_AMBULATORY_CARE_PROVIDER_SITE_OTHER): Admitting: Family

## 2024-03-11 ENCOUNTER — Other Ambulatory Visit: Payer: Self-pay | Admitting: Family

## 2024-03-11 VITALS — BP 132/80 | HR 82 | Temp 98.2°F | Wt 225.0 lb

## 2024-03-11 DIAGNOSIS — E1169 Type 2 diabetes mellitus with other specified complication: Secondary | ICD-10-CM

## 2024-03-11 DIAGNOSIS — K5010 Crohn's disease of large intestine without complications: Secondary | ICD-10-CM | POA: Diagnosis not present

## 2024-03-11 DIAGNOSIS — G4733 Obstructive sleep apnea (adult) (pediatric): Secondary | ICD-10-CM

## 2024-03-11 DIAGNOSIS — E1140 Type 2 diabetes mellitus with diabetic neuropathy, unspecified: Secondary | ICD-10-CM

## 2024-03-11 DIAGNOSIS — Z7984 Long term (current) use of oral hypoglycemic drugs: Secondary | ICD-10-CM

## 2024-03-11 DIAGNOSIS — F5101 Primary insomnia: Secondary | ICD-10-CM

## 2024-03-11 MED ORDER — GLIPIZIDE 5 MG PO TABS: 5.0000 mg | ORAL_TABLET | Freq: Two times a day (BID) | ORAL | Status: AC

## 2024-03-11 NOTE — Progress Notes (Signed)
 Established Patient Office Visit  Subjective:      CC:  Chief Complaint  Patient presents with   Follow-up    5 week follow up    HPI: Kimberly Solis is a 63 y.o. female presenting on 03/11/2024 for Follow-up (5 week follow up) .  Discussed the use of AI scribe software for clinical note transcription with the patient, who gave verbal consent to proceed.  History of Present Illness Kimberly Solis is a 63 year old female with severe sleep apnea who presents with CPAP machine issues.  She has been experiencing ongoing issues with her CPAP machine, which delivers excessive air pressure around 3 AM, disrupting her sleep, and has been shutting off unexpectedly, including the previous night. She uses a full-face mask with her CPAP machine. She has attempted to contact the supplier for assistance but has not received satisfactory support.  She uses Ambien to aid sleep, which helps her fall asleep, but the CPAP issues continue to disturb her rest. Once asleep, she does not wake up until the CPAP machine starts blowing air forcefully.  She has a history of diabetes, managed with metformin  500 mg and glipizide  5 mg, both taken twice daily. Her fasting blood sugar levels range from 106 to 132 mg/dL. She previously experienced confusion regarding her statin medication and has discontinued atorvastatin  due to adverse effects on her legs. She is not taking Zetia  due to intolerance.  She also has a history of Crohn's disease, complicating her dietary management due to conflicting needs between her diabetes and Crohn's. Balancing her diet to manage both conditions effectively is challenging.  Recently, she experienced sinus congestion, likely contracted from her grandson, which temporarily prevented her from using her CPAP machine. She managed the congestion with Sudafed and saline solution, which improved her symptoms.  She has not yet scheduled an appointment for a dermatological issue  on her neck, as she prioritizes resolving her sleep apnea concerns first.         Social history:  Relevant past medical, surgical, family and social history reviewed and updated as indicated. Interim medical history since our last visit reviewed.  Allergies and medications reviewed and updated.  DATA REVIEWED: CHART IN EPIC     ROS: Negative unless specifically indicated above in HPI.    Current Outpatient Medications:    ALPRAZolam  (XANAX ) 0.5 MG tablet, TAKE 1 TABLET (0.5 MG TOTAL) BY MOUTH AT BEDTIME AS NEEDED FOR SLEEP., Disp: 30 tablet, Rfl: 2   Blood Glucose Monitoring Suppl (BLOOD GLUCOSE SYSTEM PAK) KIT, Please dispense based on patient and insurance preference. Use as directed to monitor FSBS 2x daily. Dx: E11.9, Disp: 1 kit, Rfl: 1   Coenzyme Q10 (CO Q-10 PO), Take by mouth., Disp: , Rfl:    glucose blood (ACCU-CHEK GUIDE TEST) test strip, Use as instructed, Disp: 100 each, Rfl: 12   hydrocortisone  (ANUSOL -HC) 2.5 % rectal cream, Place 1 Application rectally 2 (two) times daily., Disp: 30 g, Rfl: 0   hyoscyamine (LEVSIN) 0.125 MG tablet, , Disp: , Rfl:    losartan  (COZAAR ) 25 MG tablet, START 1/2 TABLET BY MOUTH ONCE DAILY, Disp: 45 tablet, Rfl: 1   Magnesium 300 MG CAPS, Take 300 mg by mouth daily., Disp: , Rfl:    metFORMIN  (GLUCOPHAGE -XR) 500 MG 24 hr tablet, Take 1 tablet (500 mg total) by mouth 2 (two) times daily with a meal., Disp: , Rfl:    Multiple Vitamin (MULTIVITAMIN) tablet, Take 1 tablet by mouth daily.  [facility] +50, Disp: , Rfl:    omeprazole  (PRILOSEC) 20 MG capsule, TAKE 1 CAPSULE BY MOUTH EVERY DAY, Disp: 90 capsule, Rfl: 0   OneTouch Delica Lancets 30G MISC, USE AS DIRECTED TO MONITOR FSBS 2X DAILY, Disp: 100 each, Rfl: 1   sertraline  (ZOLOFT ) 100 MG tablet, TAKE 2 TABLETS BY MOUTH EVERY DAY, Disp: 180 tablet, Rfl: 1   Upadacitinib  ER (RINVOQ ) 30 MG TB24, Take 1 tablet (30 mg total) by mouth daily., Disp: , Rfl:    zolpidem (AMBIEN) 5 MG tablet,  Take 1 tablet (5 mg total) by mouth at bedtime as needed for sleep., Disp: 30 tablet, Rfl: 1   glipiZIDE  (GLUCOTROL ) 5 MG tablet, Take 1 tablet (5 mg total) by mouth 2 (two) times daily. TAKE 1 TABLET (5 MG TOTAL) BY MOUTH TWICE A DAY BEFORE MEALS, Disp: , Rfl:         Objective:        BP 132/80 (BP Location: Left Arm, Patient Position: Sitting, Cuff Size: Large)   Pulse 82   Temp 98.2 F (36.8 C) (Temporal)   Wt 225 lb (102.1 kg)   LMP  (LMP Unknown)   SpO2 98%   BMI 37.16 kg/m   Physical Exam   Wt Readings from Last 3 Encounters:  03/11/24 225 lb (102.1 kg)  02/05/24 227 lb 6.4 oz (103.1 kg)  11/30/23 228 lb (103.4 kg)    Physical Exam Vitals reviewed.  Constitutional:      General: She is not in acute distress.    Appearance: Normal appearance. She is normal weight. She is not ill-appearing, toxic-appearing or diaphoretic.  HENT:     Head: Normocephalic.     Right Ear: Tympanic membrane normal.     Left Ear: Tympanic membrane normal.     Nose: Nose normal.     Mouth/Throat:     Mouth: Mucous membranes are dry.     Pharynx: No oropharyngeal exudate or posterior oropharyngeal erythema.  Eyes:     Extraocular Movements: Extraocular movements intact.     Pupils: Pupils are equal, round, and reactive to light.  Cardiovascular:     Rate and Rhythm: Normal rate and regular rhythm.     Pulses: Normal pulses.     Heart sounds: Normal heart sounds.  Pulmonary:     Effort: Pulmonary effort is normal.     Breath sounds: Normal breath sounds.  Musculoskeletal:     Cervical back: Normal range of motion.  Neurological:     General: No focal deficit present.     Mental Status: She is alert and oriented to person, place, and time. Mental status is at baseline.  Psychiatric:        Mood and Affect: Mood normal.        Behavior: Behavior normal.        Thought Content: Thought content normal.        Judgment: Judgment normal.          Results LABS A1c: 8  (02/2024) Fasting blood glucose: 106-132 mg/dL  Assessment & Plan:   Assessment and Plan Assessment & Plan Obstructive sleep apnea with CPAP intolerance Severe obstructive sleep apnea with CPAP intolerance due to excessive air pressure and machine shutdowns. Current full face mask CPAP causing discomfort and sleep disruption. Potential alternative nasal CPAP discussed. - Consulted with Dr. Jess regarding CPAP intolerance and potential alternative nasal CPAP. - Contact CPAP supplier for troubleshooting and potential replacement if necessary.  Type 2 diabetes mellitus  Well-controlled with current medication regimen. Fasting blood glucose levels range from 106 to 132 mg/dL. Current regimen includes metformin  and glipizide . Discussion about future discontinuation of glipizide  due to hypoglycemia risk. - Continue metformin  500 mg twice daily and glipizide  5 mg twice daily. - Monitor blood glucose levels regularly and report any hypoglycemic symptoms. - Will reassess A1c in three months.  Insomnia Managed with Ambien, which is effective in aiding sleep onset. CPAP issues contribute to sleep disruption. - Continue Ambien as prescribed. - Address CPAP issues to improve sleep quality.  Statin intolerance and hyperlipidemia Statin intolerance with previous adverse effects from atorvastatin . Currently on ezetimibe . Discussion about potential future use of Repatha if needed. - Continue ezetimibe  10 mg daily. - Will consider Repatha if lipid levels require further management.  Crohn's disease Recent exacerbation. Challenges in managing Crohn's symptoms alongside diabetes and dietary restrictions. - Continue management with gastroenterologist. - Monitor symptoms and dietary intake to prevent exacerbations.       Return in about 3 months (around 06/11/2024) for f/u diabetes.     Ginger Patrick, MSN, APRN, FNP-C [facility]

## 2024-03-21 ENCOUNTER — Encounter: Payer: Self-pay | Admitting: Sleep Medicine

## 2024-03-21 ENCOUNTER — Ambulatory Visit: Admitting: Sleep Medicine

## 2024-03-21 VITALS — BP 120/80 | HR 76 | Temp 98.1°F | Ht 65.25 in | Wt 228.2 lb

## 2024-03-21 DIAGNOSIS — I10 Essential (primary) hypertension: Secondary | ICD-10-CM

## 2024-03-21 DIAGNOSIS — E669 Obesity, unspecified: Secondary | ICD-10-CM | POA: Diagnosis not present

## 2024-03-21 DIAGNOSIS — G4733 Obstructive sleep apnea (adult) (pediatric): Secondary | ICD-10-CM | POA: Diagnosis not present

## 2024-03-21 DIAGNOSIS — F5104 Psychophysiologic insomnia: Secondary | ICD-10-CM

## 2024-03-21 NOTE — Patient Instructions (Addendum)

## 2024-03-21 NOTE — Progress Notes (Signed)
 Name:Kimberly Solis MRN: [id number] DOB: 1961-03-01   CHIEF COMPLAINT:  ESTABLISH CARE FOR OSA   HISTORY OF PRESENT ILLNESS: Ms. Casserly is a 63 y.o. w/ a h/o OSA, HTN, DMII, anxiety, depression and obesity who presents to establish care for OSA. Reports using CPAP therapy every night, which is confirmed by compliance data. She is currently using the Airfit F20 FFM, which is not comfortable. Reports dry mouth and dry eyes.  Reports nocturnal awakenings due to unclear reasons and has difficulty falling back to sleep. Reports significant weight changes. Admits to morning headaches. Denies RLS symptoms, dream enactment, cataplexy, hypnagogic or hypnapompic hallucinations. Reports a family history of sleep apnea. Denies drowsy driving. Drinks 2 cups of coffee daily, denies alcohol, tobacco or illicit drug use.   Bedtime 10 pm Sleep onset 1-2 hours Rise time 6-6:30 am   PAST MEDICAL HISTORY :   has a past medical history of Allergy, Anxiety, Arthritis, Diabetes mellitus without complication (HCC), Glaucoma, Hyperlipidemia, Hypertension, Type II diabetes mellitus with neurological manifestations (HCC) (03/15/2015), and Ulcerative colitis (HCC) (05/05/2005).  has a past surgical history that includes Dilation and curettage of uterus; Cervical biopsy; and Endometrial biopsy. Prior to Admission medications   Medication Sig Start Date End Date Taking? Authorizing Provider  ALPRAZolam  (XANAX ) 0.5 MG tablet TAKE 1 TABLET (0.5 MG TOTAL) BY MOUTH AT BEDTIME AS NEEDED FOR SLEEP. 02/08/24  Yes Dugal, Tabitha, FNP  Blood Glucose Monitoring Suppl (BLOOD GLUCOSE SYSTEM PAK) KIT Please dispense based on patient and insurance preference. Use as directed to monitor FSBS 2x daily. Dx: E11.9 11/05/22  Yes Corwin Antu, FNP  Coenzyme Q10 (CO Q-10 PO) Take by mouth.   Yes [provider]  glipiZIDE  (GLUCOTROL ) 5 MG tablet Take 1 tablet (5 mg total) by mouth 2 (two) times daily. TAKE 1 TABLET (5  MG TOTAL) BY MOUTH TWICE A DAY BEFORE MEALS 03/11/24  Yes Dugal, Tabitha, FNP  glucose blood (ACCU-CHEK GUIDE TEST) test strip Use as instructed 10/28/23  Yes Dugal, Tabitha, FNP  hydrocortisone  (ANUSOL -HC) 2.5 % rectal cream Place 1 Application rectally 2 (two) times daily. 02/05/23  Yes Dugal, Tabitha, FNP  hyoscyamine (LEVSIN) 0.125 MG tablet  06/18/22  Yes [provider]  losartan  (COZAAR ) 25 MG tablet START 1/2 TABLET BY MOUTH ONCE DAILY 01/27/24  Yes Dugal, Tabitha, FNP  Magnesium 300 MG CAPS Take 300 mg by mouth daily.   Yes [provider]  metFORMIN  (GLUCOPHAGE -XR) 500 MG 24 hr tablet Take 1 tablet (500 mg total) by mouth 2 (two) times daily with a meal. 02/05/24  Yes Dugal, Tabitha, FNP  Multiple Vitamin (MULTIVITAMIN) tablet Take 1 tablet by mouth daily. [facility] +50   Yes [provider]  omeprazole  (PRILOSEC) 20 MG capsule TAKE 1 CAPSULE BY MOUTH EVERY DAY 11/16/23  Yes Dugal, Tabitha, FNP  OneTouch Delica Lancets 30G MISC USE AS DIRECTED TO MONITOR FSBS 2X DAILY 10/10/19  Yes [place], Theodoro FALCON, MD  sertraline  (ZOLOFT ) 100 MG tablet TAKE 2 TABLETS BY MOUTH EVERY DAY 01/15/24  Yes Dugal, Tabitha, FNP  Upadacitinib  ER (RINVOQ ) 30 MG TB24 Take 1 tablet (30 mg total) by mouth daily. 08/07/23  Yes Dugal, Tabitha, FNP  zolpidem (AMBIEN) 5 MG tablet Take 1 tablet (5 mg total) by mouth at bedtime as needed for sleep. 02/17/24  Yes Corwin Antu, FNP   Allergies  Allergen Reactions   Doxycycline Shortness Of Breath   Mesalamine Er Shortness Of Breath    And  dizziness   Trulicity  [Dulaglutide ] Other (See Comments)    Aggravated chrohns Can tolerate ozempic    Atorvastatin  Other (See Comments)    Statin intolerant   Azathioprine Nausea And Vomiting   Lisinopril  Other (See Comments)    headaches   Pravastatin  Other (See Comments)    Statin intolerance   Rosuvastatin  Other (See Comments)    Statin intolerant   Zetia  [Ezetimibe ] Other (See Comments)    FAMILY  HISTORY:  family history includes Anxiety disorder in her mother; Arthritis in her father; Breast cancer (age of onset: 19) in her sister; Cancer in her sister; Dementia in her mother; Diabetes in her father and mother; Diabetes Mellitus II in her father and maternal grandfather; Heart disease in her father and maternal grandfather; Hypertension in her father; Irritable bowel syndrome in her mother; Parkinson's disease in her maternal grandfather and mother; Stomach cancer in her paternal grandmother; Vision loss in her father. SOCIAL HISTORY:  reports that she quit smoking about 24 years ago. Her smoking use included cigarettes. She started smoking about 34 years ago. She has a 7.5 pack-year smoking history. She has never used smokeless tobacco. She reports that she does not drink alcohol and does not use drugs.   Review of Systems:  Gen:  Denies  fever, sweats, chills weight loss  HEENT: Denies blurred vision, double vision, ear pain, eye pain, hearing loss, nose bleeds, sore throat Cardiac:  No dizziness, chest pain or heaviness, chest tightness,edema, No JVD Resp:   No cough, -sputum production, -shortness of breath,-wheezing, -hemoptysis,  Gi: Denies swallowing difficulty, stomach pain, nausea or vomiting, diarrhea, constipation, bowel incontinence Gu:  Denies bladder incontinence, burning urine Ext:   Denies Joint pain, stiffness or swelling Skin: Denies  skin rash, easy bruising or bleeding or hives Endoc:  Denies polyuria, polydipsia , polyphagia or weight change Psych:   Denies depression, insomnia or hallucinations  Other:  All other systems negative  VITAL SIGNS: BP 120/80   Pulse 76   Temp 98.1 F (36.7 C)   Ht 5' 5.25 (1.657 m)   Wt 228 lb 3.2 oz (103.5 kg)   LMP  (LMP Unknown)   SpO2 96%   BMI 37.68 kg/m    Physical Examination:   General Appearance: No distress  EYES PERRLA, EOM intact.   NECK Supple, No JVD Pulmonary: normal breath sounds, No wheezing.   CardiovascularNormal S1,S2.  No m/r/g.   Abdomen: Benign, Soft, non-tender. Skin:   warm, no rashes, no ecchymosis  Extremities: normal, no cyanosis, clubbing. Neuro:without focal findings,  speech normal  PSYCHIATRIC: Mood, affect within normal limits.   ASSESSMENT AND PLAN  OSA Patient is using and benefiting from CPAP therapy. For mask discomfort, will try patient on the Airtouch N30i nasal mask. Discussed the consequences of untreated sleep apnea. Advised not to drive drowsy for safety of patient and others. Will follow up in 3 months.    Obesity Counseled patient on diet and lifestyle modification.   Insomnia Counseled patient on stimulus control and improving sleep hygiene practices.    Patient  satisfied with Plan of action and management. All questions answered  I spent a total of 62 minutes reviewing chart data, face-to-face evaluation with the patient, counseling and coordination of care as detailed above.    Kaori Jumper, M.D.  Sleep Medicine [facility]

## 2024-04-24 ENCOUNTER — Other Ambulatory Visit: Payer: Self-pay | Admitting: Family

## 2024-04-24 DIAGNOSIS — E1140 Type 2 diabetes mellitus with diabetic neuropathy, unspecified: Secondary | ICD-10-CM

## 2024-04-24 DIAGNOSIS — E1141 Type 2 diabetes mellitus with diabetic mononeuropathy: Secondary | ICD-10-CM

## 2024-05-04 LAB — OPHTHALMOLOGY REPORT-SCANNED

## 2024-06-17 ENCOUNTER — Other Ambulatory Visit

## 2024-06-24 ENCOUNTER — Ambulatory Visit: Admitting: Family

## 2024-07-04 ENCOUNTER — Ambulatory Visit: Admitting: Sleep Medicine
